# Patient Record
Sex: Male | Born: 1950
Health system: Southern US, Community
[De-identification: ages and names within clinical notes are randomized; demographics above are authoritative.]

## PROBLEM LIST (undated history)

## (undated) DIAGNOSIS — I10 Essential (primary) hypertension: Secondary | ICD-10-CM

## (undated) DIAGNOSIS — K5790 Diverticulosis of intestine, part unspecified, without perforation or abscess without bleeding: Secondary | ICD-10-CM

## (undated) DIAGNOSIS — E119 Type 2 diabetes mellitus without complications: Secondary | ICD-10-CM

## (undated) DIAGNOSIS — J45909 Unspecified asthma, uncomplicated: Secondary | ICD-10-CM

## (undated) DIAGNOSIS — H547 Unspecified visual loss: Secondary | ICD-10-CM

## (undated) DIAGNOSIS — M109 Gout, unspecified: Secondary | ICD-10-CM

## (undated) DIAGNOSIS — C61 Malignant neoplasm of prostate: Secondary | ICD-10-CM

## (undated) HISTORY — DX: Malignant neoplasm of prostate: C61

## (undated) HISTORY — DX: Gout, unspecified: M10.9

## (undated) HISTORY — DX: Diverticulosis of intestine, part unspecified, without perforation or abscess without bleeding: K57.90

## (undated) HISTORY — DX: Essential (primary) hypertension: I10

## (undated) HISTORY — DX: Unspecified asthma, uncomplicated: J45.909

## (undated) HISTORY — DX: Type 2 diabetes mellitus without complications: E11.9

## (undated) HISTORY — DX: Unspecified visual loss: H54.7

---

## 1999-04-24 ENCOUNTER — Encounter: Admission: RE | Admit: 1999-04-24 | Discharge: 1999-04-24 | Payer: Self-pay | Admitting: Internal Medicine

## 2001-12-02 ENCOUNTER — Encounter (INDEPENDENT_AMBULATORY_CARE_PROVIDER_SITE_OTHER): Payer: Self-pay | Admitting: Specialist

## 2001-12-02 ENCOUNTER — Ambulatory Visit (HOSPITAL_COMMUNITY): Admission: RE | Admit: 2001-12-02 | Discharge: 2001-12-02 | Payer: Self-pay | Admitting: *Deleted

## 2010-10-10 ENCOUNTER — Ambulatory Visit
Admission: RE | Admit: 2010-10-10 | Discharge: 2010-10-10 | Payer: Self-pay | Source: Home / Self Care | Attending: Urology | Admitting: Urology

## 2010-10-10 LAB — GLUCOSE, CAPILLARY: Glucose-Capillary: 107 mg/dL — ABNORMAL HIGH (ref 70–99)

## 2010-10-10 LAB — POCT I-STAT 4, (NA,K, GLUC, HGB,HCT): Glucose, Bld: 112 mg/dL — ABNORMAL HIGH (ref 70–99)

## 2010-10-15 NOTE — Op Note (Signed)
  NAME:  Austin Anderson, Austin Anderson                  ACCOUNT NO.:  [id number]  MEDICAL RECORD NO.:  [id number]          PATIENT TYPE:  AMB  LOCATION:  [facility]                         FACILITY:  [facility]  PHYSICIAN:  Maretta Bees. Vonita Moss, M.D.DATE OF BIRTH:  [birth year]  DATE OF PROCEDURE:  10/10/2010 DATE OF DISCHARGE:                              OPERATIVE REPORT   LOCATION:  [facility].  PREOPERATIVE DIAGNOSIS:  Elevated prostate-specific antigen and anal stenosis.  POSTOPERATIVE DIAGNOSIS:  Elevated prostate-specific antigen and anal stenosis.  PROCEDURE:  Transurethral ultrasound and biopsy of the prostate and dilation of anal stricture.  SURGEON:  Maretta Bees. Vonita Moss, M.D.  ANESTHESIA:  MAC.  INDICATIONS:  This gentleman was referred to me for evaluation of a PSA elevation of 4.36.  In the office, he was found to have anal stenosis, and I could not even palpate the prostate.  He is brought to the OR today for dilation of the anal stenosis and transurethral ultrasound biopsy of the prostate.  DESCRIPTION OF PROCEDURE:  The patient was brought to the operating room and placed in the left lateral decubitus position.  His anal stenosis was dilated using 2 digits.  I then inserted the transrectal ultrasound probe, and the seminal vesicles were unremarkable.  The prostate had 1 large right-sided calcifications.  There were no definite hypoechoic areas.  Urethral length was 5.43 cm and prostatic volume was 77 cc. Transrectal ultrasound-guided biopsies were taken from the mid base and apex bilaterally with medial and lateral biopsies in each area for a total of 12 cores in 12 bottles.  The ultrasound probe was removed.  The patient was taken to recovery room in good condition having tolerated procedure well.     Maretta Bees. Vonita Moss, M.D.     LJP/MEDQ  D:  10/10/2010  T:  10/10/2010  Job:  [id number]  cc:   Georgianne Fick, M.D. Fax: [phone]  Electronically Signed by Larey Dresser M.D. on 10/15/2010 03:52:46 PM

## 2010-11-27 ENCOUNTER — Encounter (HOSPITAL_COMMUNITY): Payer: Medicare Other

## 2010-11-27 ENCOUNTER — Other Ambulatory Visit: Payer: Self-pay | Admitting: Urology

## 2010-11-27 ENCOUNTER — Other Ambulatory Visit (HOSPITAL_COMMUNITY): Payer: Self-pay | Admitting: Urology

## 2010-11-27 ENCOUNTER — Ambulatory Visit (HOSPITAL_COMMUNITY)
Admission: RE | Admit: 2010-11-27 | Discharge: 2010-11-27 | Disposition: A | Discharge status: Home / Self Care | Payer: Medicare Other | Source: Ambulatory Visit | Attending: Urology | Admitting: Urology

## 2010-11-27 DIAGNOSIS — Z01818 Encounter for other preprocedural examination: Secondary | ICD-10-CM | POA: Insufficient documentation

## 2010-11-27 DIAGNOSIS — C61 Malignant neoplasm of prostate: Secondary | ICD-10-CM | POA: Insufficient documentation

## 2010-11-27 DIAGNOSIS — J45909 Unspecified asthma, uncomplicated: Secondary | ICD-10-CM | POA: Insufficient documentation

## 2010-11-27 DIAGNOSIS — K219 Gastro-esophageal reflux disease without esophagitis: Secondary | ICD-10-CM | POA: Insufficient documentation

## 2010-11-27 DIAGNOSIS — E119 Type 2 diabetes mellitus without complications: Secondary | ICD-10-CM | POA: Insufficient documentation

## 2010-11-27 DIAGNOSIS — G4730 Sleep apnea, unspecified: Secondary | ICD-10-CM | POA: Insufficient documentation

## 2010-11-27 DIAGNOSIS — I10 Essential (primary) hypertension: Secondary | ICD-10-CM | POA: Insufficient documentation

## 2010-11-27 DIAGNOSIS — Z01812 Encounter for preprocedural laboratory examination: Secondary | ICD-10-CM | POA: Insufficient documentation

## 2010-11-27 DIAGNOSIS — Z79899 Other long term (current) drug therapy: Secondary | ICD-10-CM | POA: Insufficient documentation

## 2010-11-27 LAB — BASIC METABOLIC PANEL
BUN: 11 mg/dL (ref 6–23)
CO2: 26 mEq/L (ref 19–32)
Chloride: 107 mEq/L (ref 96–112)
Creatinine, Ser: 1.24 mg/dL (ref 0.4–1.5)
Glucose, Bld: 118 mg/dL — ABNORMAL HIGH (ref 70–99)
Potassium: 4.3 mEq/L (ref 3.5–5.1)

## 2010-11-27 LAB — CBC
HCT: 39.1 % (ref 39.0–52.0)
Hemoglobin: 12.4 g/dL — ABNORMAL LOW (ref 13.0–17.0)
MCH: 28.7 pg (ref 26.0–34.0)
MCV: 90.5 fL (ref 78.0–100.0)
RBC: 4.32 MIL/uL (ref 4.22–5.81)

## 2010-12-04 ENCOUNTER — Inpatient Hospital Stay (HOSPITAL_COMMUNITY)
Admission: RE | Admit: 2010-12-04 | Discharge: 2010-12-05 | DRG: 708 | Disposition: A | Discharge status: Home / Self Care | Payer: Medicare Other | Source: Ambulatory Visit | Attending: Urology | Admitting: Urology

## 2010-12-04 ENCOUNTER — Other Ambulatory Visit: Payer: Self-pay | Admitting: Urology

## 2010-12-04 DIAGNOSIS — C61 Malignant neoplasm of prostate: Principal | ICD-10-CM | POA: Diagnosis present

## 2010-12-04 DIAGNOSIS — I10 Essential (primary) hypertension: Secondary | ICD-10-CM | POA: Diagnosis present

## 2010-12-04 DIAGNOSIS — K219 Gastro-esophageal reflux disease without esophagitis: Secondary | ICD-10-CM | POA: Diagnosis present

## 2010-12-04 DIAGNOSIS — E119 Type 2 diabetes mellitus without complications: Secondary | ICD-10-CM | POA: Diagnosis present

## 2010-12-04 DIAGNOSIS — N35919 Unspecified urethral stricture, male, unspecified site: Secondary | ICD-10-CM | POA: Diagnosis present

## 2010-12-04 LAB — GLUCOSE, CAPILLARY

## 2010-12-04 LAB — TYPE AND SCREEN: Antibody Screen: NEGATIVE

## 2010-12-04 LAB — ABO/RH: ABO/RH(D): O POS

## 2010-12-04 LAB — HEMOGLOBIN AND HEMATOCRIT, BLOOD: Hemoglobin: 11.9 g/dL — ABNORMAL LOW (ref 13.0–17.0)

## 2010-12-05 LAB — GLUCOSE, CAPILLARY
Glucose-Capillary: 107 mg/dL — ABNORMAL HIGH (ref 70–99)
Glucose-Capillary: 139 mg/dL — ABNORMAL HIGH (ref 70–99)
Glucose-Capillary: 147 mg/dL — ABNORMAL HIGH (ref 70–99)
Glucose-Capillary: 96 mg/dL (ref 70–99)

## 2010-12-10 NOTE — Op Note (Signed)
NAMEARWIN, BISCEGLIA                  ACCOUNT NO.:  [id number]  MEDICAL RECORD NO.:  [id number]           PATIENT TYPE:  I  LOCATION:  1420                         FACILITY:  [facility]  PHYSICIAN:  Heloise Purpura, MD      DATE OF BIRTH:  09-Dec-1950  DATE OF PROCEDURE:  12/04/2010 DATE OF DISCHARGE:                              OPERATIVE REPORT   PREOPERATIVE DIAGNOSIS:  Clinically localized adenocarcinoma of the prostate (clinical stage T1c NX MX).  POSTOPERATIVE DIAGNOSES: 1. Clinically localized adenocarcinoma of the prostate (clinical stage     T1c NX MX). 2. Urethral stricture.  PROCEDURES: 1. Robotic-assisted laparoscopic radical prostatectomy (bilateral     nerve sparing). 2. Cystoscopy. 3. Balloon dilation of urethral stricture.  SURGEON:  Heloise Purpura, MD  ASSISTANT:  Delia Chimes, NP-C  ANESTHESIA:  General.  COMPLICATIONS:  None.  ESTIMATED BLOOD LOSS:  50 cc.  INTRAVENOUS FLUIDS:  1500 mL of lactated Ringer's.  SPECIMEN:  Prostate and seminal vesicles.  Disposition of specimen to Pathology.  DRAINS: 1. A 20-French coude catheter. 2. A #19 Blake pelvic drain.  INDICATION:  Austin Anderson is a 60 year old gentleman with recently diagnosed clinically localized prostate cancer.  After thorough discussions regarding management options for treatment, he elected to proceed with surgical therapy and the above procedures.  The potential risks, complications, and alternative treatment options were discussed in detail and informed consent was obtained.  DESCRIPTION OF PROCEDURE:  The patient was taken to the operating room and a general anesthetic was administered.  He was given preoperative antibiotics, placed in the dorsal lithotomy position, and prepped and draped in the usual sterile fashion.  A preoperative time-out was then performed.  A 22-French catheter was then passed into the urethra, but was unable to be passed all the way into the bladder.  Upon removal  of this catheter, it was noted be blood at the meatus.  An attempt was then made to place a 20-French coude catheter which also met resistance in the proximal urethra, approximately at the same site.  At this point, it was decided to proceed with flexible cystoscopy.  Therefore, the 70- [place] flexible cystoscope was utilized to inspect the urethra.  In the bulbar urethra, there did appear to be a short bulbar urethral stricture and a false passage in this area.  This was able to be navigated with a flexible cystoscope into the bladder and a 0.038 sensor guidewire was advanced through the scope and into the bladder.  The cystoscope was then removed and an attempt was made to pass a 20-French council tip catheter over the wire, but this did resistance in the bulbar urethra. This was removed and an attempt was then made to pass an 18-French council tip catheter which also was unable to be passed all the way into the bladder due to the urethral stricture.  Therefore, the flexible cystoscope was passed next to the wire up to the level of the stricture. An Ultraxx balloon dilator was then passed through the flexible cystoscope and into the bladder and across the urethral stricture.  It was positioned under direct vision  and the cystoscope was removed.  The balloon was then dilated up to a pressure of 17 atmospheres of pressure. This was left inflated for approximately 3 minutes and then deflated and removed.  The 18-French council tip catheter was then able to be passed over the wire into the bladder without difficulty.  The patient's abdomen was then examined and a site was selected just to the left of the umbilicus for placement of the camera port.  This was placed using a standard open Hasson technique.  This allowed entry into the peritoneal cavity under direct vision and without difficulty.  A 12-mm port was placed and pneumoperitoneum established.  With the 0-degree lens, the abdomen was  inspected and there was no evidence of any intra-abdominal injuries or other abnormalities.  The remaining ports were then placed. A 12-mm port was placed in the far right lateral abdominal wall and 8-mm robotic ports were placed in the right lower quadrant, left lower quadrant, and far left lower quadrant.  A 5-mm port was placed in the right upper quadrant.  All ports were placed under direct vision and without difficulty.  The surgical cart was then docked.  With the aid of the cautery scissors, the bladder was reflected posteriorly allowing entry into space of Retzius and identification of the endopelvic fascia and prostate.  The endopelvic fascia was then incised from the apex back to the base of the prostate bilaterally and the underlying levator muscle fibers were swept laterally off the prostate thereby isolating the dorsal venous complex.  The dorsal vein was then stapled and divided with a 45 mm Flex Echelon stapler.  The bladder neck was identified and was able to be divided anteriorly thereby exposing the Foley catheter. The catheter along with the sensor guidewire was then brought anteriorly and used to retract the prostate anteriorly.  The posterior bladder neck was examined and there was no evidence for a large intravesical median lobe.  The bladder neck was divided and dissection proceeded between the bladder and prostate until the vasa deferentia and seminal vesicles were identified.  The vasa deferentia were isolated, divided, and lifted anteriorly and the seminal vesicles were dissected down to their tips with care to control the seminal vesicle arterial blood supply and also lifted anteriorly.  The space between Denonvilliers fascia and the anterior rectum was then developed with combination of sharp and blunt dissection.  The lateral prostatic fascia was incised bilaterally allowing the neurovascular bundles to be released and the vascular pedicles of the prostate then  ligated with Hem-o-lok clips above the level of the neurovascular bundles.  The neurovascular bundles were then swept off the apex of the prostate and urethra and urethra was sharply transected.  The prostate was thereby disarticulated and the pelvis was copiously irrigated and hemostasis was ensured.  There was no evidence for rectal injury.  Attention then turned to the urethral anastomosis. The council tip catheter was slightly retracted into the bulbar urethra, although the guidewire was left in place to the urethra and into the operative field.  A 2-0 Vicryl slip knot was placed between Denonvilliers fascia, the posterior bladder neck, and the posterior urethra to reapproximate these structures.  A double-armed 3-0 Monocryl suture was then used to perform a 360-degree running tension-free anastomosis between the bladder neck and urethra.  Once the majority of the anastomosis was completed, the catheter was advanced over the guidewire and the guidewire was removed.  The catheter was directly placed into the bladder under direct  vision and the balloon subsequently inflated.  The anastomosis was carefully performed to completion with a catheter in place, taking care not to injure the catheter.  This allowed completion of the anastomosis which was performed in a tension-free and watertight fashion.  This was confirmed after the catheter was irrigated with no evidence of any urine leak.  A #19 Blake drain was then brought through the left robotic port and positioned appropriately in the pelvis.  It was secured to the skin with a nylon suture.  The surgical cart was undocked.  The right lateral 12-mm port site was closed with a 0 Vicryl suture placed laparoscopically.  All remaining ports removed under direct vision and the prostate specimen was removed intact within the Endopouch retrieval bag via the periumbilical port site.  This fascial opening was then closed with 2 running 0 Vicryl  sutures.  All port sites were injected with 0.25% Marcaine and reapproximated at the skin level with staples.  Sterile dressings were applied.  The patient appeared to tolerate the procedure well and without complications.  He was able to be extubated and transferred to recovery unit in satisfactory condition.     Heloise Purpura, MD     LB/MEDQ  D:  12/04/2010  T:  12/04/2010  Job:  [id number]  Electronically Signed by Heloise Purpura MD on 12/10/2010 10:22:58 PM

## 2011-01-30 NOTE — Procedures (Signed)
[facility]  Patient:    Austin Anderson, Austin Anderson Visit Number: [id number] MRN: [id number]          Service Type: END Location: ENDO Attending Physician:  Sabino Gasser Dictated by:   Sabino Gasser, M.D. Proc. Date: 12/02/01 Admit Date:  12/02/2001                             Procedure Report  PROCEDURE:  Upper endoscopy.  INDICATION:  Hemoccult positivity.  ANESTHESIA:  Demerol 75, Versed 6 mg.  DESCRIPTION OF PROCEDURE:  With patient mildly sedated in the left lateral decubitus position, the Olympus videoscopic endoscope was inserted into the mouth and passed under direct vision through the esophagus which appeared normal.  We entered into the stomach, and there was blood seen in the stomach. This was photographed.  We advanced to the antrum, duodenal bulb, and second portion of duodenum, appeared normal.  From this point, the endoscope was slowly withdrawn, taking circumferential views of the entire duodenal mucosa until the endoscope then pulled back into the stomach and placed in retroflexion to view the stomach from below.  The endoscope was then straightened and withdrawn, taking circumferential views of the remaining gastric and esophageal mucosa, stopping to biopsy and photograph the areas in the body and fundus where the blood was seen.  The patients vital signs and pulse oximeter remained stable.  The patient tolerated the procedure well without apparent complications.  FINDINGS:  Blood in the stomach, etiology unclear.  Otherwise unremarkable examination.  PLAN:  Await biopsy report.  The patient will call me for results and follow up with me as an outpatient.  Proceed to colonoscopy as planned. Dictated by:   Sabino Gasser, M.D. Attending Physician:  Sabino Gasser DD:  12/02/01 TD:  12/04/01 Job: [id number]

## 2011-01-30 NOTE — Procedures (Signed)
[facility]  Patient:    Austin Anderson, Austin Anderson Visit Number: [id number] MRN: [id number]          Service Type: END Location: ENDO Attending Physician:  Sabino Gasser Dictated by:   Sabino Gasser, M.D. Proc. Date: 12/02/01 Admit Date:  12/02/2001                             Procedure Report  PROCEDURE:  Colonoscopy.  INDICATIONS:  Hemoccult positivity.  ANESTHESIA:  Demerol 20 mg, Versed 2 mg additional.  DESCRIPTION OF PROCEDURE:  With the patient mildly sedated in the left lateral decubitus position, subsequently with pressure applied, the Olympus videoscopic colonoscope was inserted in the rectum after rectal exam was performed and passed under direct vision to the cecum, identified by the ileocecal valve and appendiceal orifice. The prep was somewhat suboptimal in that there was tenacious material on the colonic wall that could not be adequately washed but no gross lesions were seen in the cecum. From this point, the colonoscope was slowly withdrawn taking circumferential views of the entire colonic mucosa stopping to photograph diverticulosis seen in the sigmoid colon along the wall until we reached the rectum, which appeared normal in direct view and showed hemorrhoids on retroflex and pullthrough view of the anal canal. The endoscope was withdrawn. The patients vital signs and pulse oximeter remained stable. The patient tolerated the procedure well without apparent complications.  FINDINGS:  Diverticulosis of the sigmoid colon and internal and external hemorrhoids.  PLAN:  See endoscopy note for further details. Dictated by:   Sabino Gasser, M.D. Attending Physician:  Sabino Gasser DD:  12/02/01 TD:  12/04/01 Job: [id number]

## 2011-11-05 DIAGNOSIS — E119 Type 2 diabetes mellitus without complications: Secondary | ICD-10-CM | POA: Diagnosis not present

## 2011-11-05 DIAGNOSIS — I10 Essential (primary) hypertension: Secondary | ICD-10-CM | POA: Diagnosis not present

## 2011-11-05 DIAGNOSIS — M109 Gout, unspecified: Secondary | ICD-10-CM | POA: Diagnosis not present

## 2011-11-19 DIAGNOSIS — K644 Residual hemorrhoidal skin tags: Secondary | ICD-10-CM | POA: Diagnosis not present

## 2011-11-19 DIAGNOSIS — E119 Type 2 diabetes mellitus without complications: Secondary | ICD-10-CM | POA: Diagnosis not present

## 2011-11-19 DIAGNOSIS — I10 Essential (primary) hypertension: Secondary | ICD-10-CM | POA: Diagnosis not present

## 2011-11-19 DIAGNOSIS — M109 Gout, unspecified: Secondary | ICD-10-CM | POA: Diagnosis not present

## 2011-12-29 DIAGNOSIS — C61 Malignant neoplasm of prostate: Secondary | ICD-10-CM | POA: Diagnosis not present

## 2012-03-29 DIAGNOSIS — E119 Type 2 diabetes mellitus without complications: Secondary | ICD-10-CM | POA: Diagnosis not present

## 2012-03-29 DIAGNOSIS — K644 Residual hemorrhoidal skin tags: Secondary | ICD-10-CM | POA: Diagnosis not present

## 2012-03-29 DIAGNOSIS — M109 Gout, unspecified: Secondary | ICD-10-CM | POA: Diagnosis not present

## 2012-03-29 DIAGNOSIS — I10 Essential (primary) hypertension: Secondary | ICD-10-CM | POA: Diagnosis not present

## 2012-04-05 DIAGNOSIS — J45909 Unspecified asthma, uncomplicated: Secondary | ICD-10-CM | POA: Diagnosis not present

## 2012-04-05 DIAGNOSIS — E119 Type 2 diabetes mellitus without complications: Secondary | ICD-10-CM | POA: Diagnosis not present

## 2012-04-05 DIAGNOSIS — I10 Essential (primary) hypertension: Secondary | ICD-10-CM | POA: Diagnosis not present

## 2012-04-05 DIAGNOSIS — M109 Gout, unspecified: Secondary | ICD-10-CM | POA: Diagnosis not present

## 2012-07-12 DIAGNOSIS — N529 Male erectile dysfunction, unspecified: Secondary | ICD-10-CM | POA: Diagnosis not present

## 2012-07-12 DIAGNOSIS — C61 Malignant neoplasm of prostate: Secondary | ICD-10-CM | POA: Diagnosis not present

## 2012-08-23 DIAGNOSIS — M109 Gout, unspecified: Secondary | ICD-10-CM | POA: Diagnosis not present

## 2012-08-23 DIAGNOSIS — J45909 Unspecified asthma, uncomplicated: Secondary | ICD-10-CM | POA: Diagnosis not present

## 2012-08-23 DIAGNOSIS — I10 Essential (primary) hypertension: Secondary | ICD-10-CM | POA: Diagnosis not present

## 2012-08-23 DIAGNOSIS — E119 Type 2 diabetes mellitus without complications: Secondary | ICD-10-CM | POA: Diagnosis not present

## 2012-11-29 ENCOUNTER — Encounter: Payer: Self-pay | Admitting: Internal Medicine

## 2012-11-29 DIAGNOSIS — C61 Malignant neoplasm of prostate: Secondary | ICD-10-CM | POA: Insufficient documentation

## 2012-11-30 ENCOUNTER — Encounter: Payer: Self-pay | Admitting: Internal Medicine

## 2012-11-30 ENCOUNTER — Ambulatory Visit (INDEPENDENT_AMBULATORY_CARE_PROVIDER_SITE_OTHER): Payer: Medicare Other | Admitting: Internal Medicine

## 2012-11-30 VITALS — BP 136/90 | HR 81 | Temp 98.1°F | Ht 65.0 in | Wt 230.4 lb

## 2012-11-30 DIAGNOSIS — M109 Gout, unspecified: Secondary | ICD-10-CM | POA: Diagnosis not present

## 2012-11-30 DIAGNOSIS — C61 Malignant neoplasm of prostate: Secondary | ICD-10-CM | POA: Diagnosis not present

## 2012-11-30 DIAGNOSIS — Z23 Encounter for immunization: Secondary | ICD-10-CM

## 2012-11-30 DIAGNOSIS — E119 Type 2 diabetes mellitus without complications: Secondary | ICD-10-CM | POA: Insufficient documentation

## 2012-11-30 DIAGNOSIS — J45909 Unspecified asthma, uncomplicated: Secondary | ICD-10-CM | POA: Diagnosis not present

## 2012-11-30 DIAGNOSIS — I10 Essential (primary) hypertension: Secondary | ICD-10-CM | POA: Diagnosis not present

## 2012-11-30 DIAGNOSIS — H543 Unqualified visual loss, both eyes: Secondary | ICD-10-CM | POA: Diagnosis not present

## 2012-11-30 DIAGNOSIS — H547 Unspecified visual loss: Secondary | ICD-10-CM | POA: Insufficient documentation

## 2012-11-30 LAB — LIPID PANEL
Cholesterol: 128 mg/dL (ref 0–200)
HDL: 46 mg/dL (ref >39)
LDL Cholesterol: 74 mg/dL (ref 0–99)
Triglycerides: 40 mg/dL (ref <150)

## 2012-11-30 LAB — CBC WITH DIFFERENTIAL/PLATELET
Basophils Absolute: 0 10*3/uL (ref 0.0–0.1)
Basophils Relative: 1 % (ref 0–1)
Eosinophils Absolute: 0.3 10*3/uL (ref 0.0–0.7)
Hemoglobin: 13.9 g/dL (ref 13.0–17.0)
MCH: 28.6 pg (ref 26.0–34.0)
MCHC: 33.3 g/dL (ref 30.0–36.0)
Monocytes Relative: 6 % (ref 3–12)
Neutro Abs: 3.7 10*3/uL (ref 1.7–7.7)
Neutrophils Relative %: 56 % (ref 43–77)
Platelets: 217 10*3/uL (ref 150–400)

## 2012-11-30 MED ORDER — LOSARTAN POTASSIUM 100 MG PO TABS: 100.0000 mg | ORAL_TABLET | Freq: Every day | ORAL | Status: DC

## 2012-11-30 MED ORDER — FLUTICASONE PROPIONATE HFA 110 MCG/ACT IN AERO: 1.0000 | INHALATION_SPRAY | Freq: Two times a day (BID) | RESPIRATORY_TRACT | Status: DC

## 2012-11-30 MED ORDER — ATENOLOL 25 MG PO TABS: 25.0000 mg | ORAL_TABLET | Freq: Every day | ORAL | Status: DC

## 2012-11-30 MED ORDER — ALLOPURINOL 300 MG PO TABS: 300.0000 mg | ORAL_TABLET | Freq: Two times a day (BID) | ORAL | Status: DC

## 2012-11-30 MED ORDER — ALBUTEROL SULFATE HFA 108 (90 BASE) MCG/ACT IN AERS: 2.0000 | INHALATION_SPRAY | Freq: Four times a day (QID) | RESPIRATORY_TRACT | Status: DC | PRN

## 2012-11-30 NOTE — Progress Notes (Signed)
HPI The patient is a 62 y.o. male with a history of asthma, DM, HTN, gout, presenting for an initial visit to establish care.  The patient was previously seen by [facility], but was discharged when he lost his insurance.  The patient has a history of gout, which he describes as well-controlled.  In the past he had frequent flares, but now only has 2-3 flares per day.  The patient is currently taking allopurinol, 300 mg BID.  During acute flares, he takes Indomethacin, 50 mg BID until the flare resolves (typically 3-4 days).  The patient has a history of diabetes, diagnosed in 2009.  His last A1C was 7.1, one year ago.  The patient does not check his blood sugar at home, and does not have a glucometer.  The patient has never been on treatment for diabetes.  The patient notes that he tries to make healthy changes to his diet, choosing sugar free substitutes when able, and eating less bread.  The patient has a treadmill at home, which he uses every day, for about 30 minutes.  He notes no polyuria/polydipsia, tremulousness, diaphoresis, or AMS.  The patient has a history of high blood pressure for 15 years.  The patient has a history of prostate cancer, s/p radical prostatectomy in 12/04/10 with Dr. Laverle Patter.  The patient still goes to see Dr. Laverle Patter every 6 months, with no signs of recurrence.  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    The patient has a history of asthma.  The patient has never been intubated, but has taken steroids in the past.  He currently uses albuterol once/week.    The patient has not had a flu shot this year.  The patient had a pneumovax at [facility].  ROS: General: no fevers, chills, changes in weight, changes in appetite Skin: no rash HEENT: no hearing changes, sore throat Pulm: no dyspnea, coughing, wheezing CV: no chest pain, palpitations, shortness of breath Abd: no  abdominal pain, nausea/vomiting, diarrhea/constipation GU: no dysuria, hematuria, polyuria Ext: no arthralgias, myalgias Neuro: no weakness, numbness, or tingling  Filed Vitals:   11/30/12 1331  BP: 136/90  Pulse: 81  Temp: 98.1 F (36.7 C)    PEX General: alert, cooperative, and in no apparent distress HEENT: eyes clouded, unfocused.  Oropharynx non-erythematous  Neck: supple, no lymphadenopathy, no JVD Lungs: clear to ascultation bilaterally, normal work of respiration, no wheezes, rales, ronchi Heart: regular rate and rhythm, no murmurs, gallops, or rubs Abdomen: soft, non-tender, non-distended, normal bowel sounds Extremities: no cyanosis, clubbing, or edema Neurologic: alert & oriented X3, cranial nerves II-XII intact, strength grossly intact, sensation intact to light touch  No current outpatient prescriptions on file prior to visit.   No current facility-administered medications on file prior to visit.    Assessment/Plan

## 2012-11-30 NOTE — Assessment & Plan Note (Signed)
Continue flovent and albuterol

## 2012-11-30 NOTE — Assessment & Plan Note (Signed)
Lab Results  Component Value Date   HGBA1C 6.4 11/30/2012     Assessment:  Diabetes control: good control (HgbA1C at goal)  Progress toward A1C goal:  at goal  Comments: The patient has never been on medications for DM.  A1C's in the past have been as high as 7.1.  We will continue to manage the patient with only diet/exercise.  Plan:  Medications:  No medications, continue diet/exercise changes  Home glucose monitoring:   Frequency:     Timing:    Instruction/counseling given: reminded to bring medications to each visit  Educational resources provided:    Self management tools provided:    Other plans: We will try to get him a talking glucometer

## 2012-11-30 NOTE — Assessment & Plan Note (Signed)
Followed by Dr. Laverle Patter every 6 months.  Greatly appreciate assistance.

## 2012-11-30 NOTE — Assessment & Plan Note (Signed)
Will check uric acid level today -continue allopurinol 300 mg BID, titrate to uric acid < 6

## 2012-11-30 NOTE — Assessment & Plan Note (Signed)
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  He lives by himself, and has an aide help him with his medications.

## 2012-11-30 NOTE — Assessment & Plan Note (Signed)
BP Readings from Last 3 Encounters:  11/30/12 136/90    Lab Results  Component Value Date   NA 140 11/27/2010   K 4.3 11/27/2010   CREATININE 1.24 11/27/2010    Assessment:  Blood pressure control: controlled  Progress toward BP goal:  at goal  Comments: BP high-normal today, likely due to being out of his medications here at his transition of care.  Plan:  Medications:  continue current medications  Educational resources provided:    Self management tools provided:    Other plans: check CMP today

## 2012-11-30 NOTE — Patient Instructions (Addendum)
General Instructions: We will continue to follow your diabetes.  Your Hemoglobin A1C was 6.4 today, which is great!  We want to keep this number under 7.  In the future, if your hemoglobin A1C rises above 7, we may need to start a medication called Metformin to lower your blood sugar.  Continue to take your current medications (a list has been provided).  We are checking several labs today.  We will call you if any of them are abnormal.  Please return for a follow-up visit in 3 months.   Treatment Goals:  Goals (1 Years of Data) as of 11/30/12   None      Progress Toward Treatment Goals:  Treatment Goal 11/30/2012  Hemoglobin A1C at goal  Blood pressure at goal    Self Care Goals & Plans:       Care Management & Community Referrals:

## 2012-12-01 LAB — COMPLETE METABOLIC PANEL WITH GFR
ALT: 14 U/L (ref 0–53)
AST: 20 U/L (ref 0–37)
Albumin: 4.3 g/dL (ref 3.5–5.2)
Alkaline Phosphatase: 53 U/L (ref 39–117)
GFR, Est Non African American: 58 mL/min — ABNORMAL LOW
Potassium: 4.9 mEq/L (ref 3.5–5.3)
Sodium: 141 mEq/L (ref 135–145)
Total Bilirubin: 0.6 mg/dL (ref 0.3–1.2)
Total Protein: 7.1 g/dL (ref 6.0–8.3)

## 2012-12-01 LAB — MICROALBUMIN / CREATININE URINE RATIO
Creatinine, Urine: 257.4 mg/dL
Microalb Creat Ratio: 10.2 mg/g (ref 0.0–30.0)
Microalb, Ur: 2.63 mg/dL — ABNORMAL HIGH (ref 0.00–1.89)

## 2012-12-21 ENCOUNTER — Other Ambulatory Visit: Payer: Self-pay | Admitting: Dietician

## 2012-12-21 DIAGNOSIS — E119 Type 2 diabetes mellitus without complications: Secondary | ICD-10-CM

## 2013-01-05 ENCOUNTER — Telehealth: Payer: Self-pay | Admitting: Dietician

## 2013-01-05 NOTE — Telephone Encounter (Signed)
Patient agreed to see CDE on same day as doctor in June.

## 2013-01-13 DIAGNOSIS — C61 Malignant neoplasm of prostate: Secondary | ICD-10-CM | POA: Diagnosis not present

## 2013-01-13 DIAGNOSIS — N529 Male erectile dysfunction, unspecified: Secondary | ICD-10-CM | POA: Diagnosis not present

## 2013-02-15 ENCOUNTER — Encounter: Payer: Self-pay | Admitting: Internal Medicine

## 2013-02-15 ENCOUNTER — Ambulatory Visit (HOSPITAL_BASED_OUTPATIENT_CLINIC_OR_DEPARTMENT_OTHER): Payer: Medicare Other | Admitting: Dietician

## 2013-02-15 ENCOUNTER — Ambulatory Visit (HOSPITAL_BASED_OUTPATIENT_CLINIC_OR_DEPARTMENT_OTHER): Payer: Medicare Other | Admitting: Internal Medicine

## 2013-02-15 VITALS — BP 122/80 | HR 66 | Temp 97.8°F | Ht 65.0 in | Wt 231.1 lb

## 2013-02-15 DIAGNOSIS — R109 Unspecified abdominal pain: Secondary | ICD-10-CM | POA: Insufficient documentation

## 2013-02-15 DIAGNOSIS — N182 Chronic kidney disease, stage 2 (mild): Secondary | ICD-10-CM | POA: Insufficient documentation

## 2013-02-15 DIAGNOSIS — E119 Type 2 diabetes mellitus without complications: Secondary | ICD-10-CM | POA: Diagnosis not present

## 2013-02-15 DIAGNOSIS — I10 Essential (primary) hypertension: Secondary | ICD-10-CM | POA: Diagnosis not present

## 2013-02-15 DIAGNOSIS — N183 Chronic kidney disease, stage 3 unspecified: Secondary | ICD-10-CM | POA: Insufficient documentation

## 2013-02-15 LAB — POCT GLYCOSYLATED HEMOGLOBIN (HGB A1C): Hemoglobin A1C: 6.2

## 2013-02-15 LAB — GLUCOSE, CAPILLARY: Glucose-Capillary: 110 mg/dL — ABNORMAL HIGH (ref 70–99)

## 2013-02-15 MED ORDER — ATORVASTATIN CALCIUM 40 MG PO TABS: 40.0000 mg | ORAL_TABLET | Freq: Every day | ORAL | Status: DC

## 2013-02-15 NOTE — Patient Instructions (Signed)
General Instructions: We found at your last visit that your cholesterol is mildly elevated.  We will start you on a medication called Atorvastatin, 1 tablet once per day.  Your diabetes is very well controlled.  Please return for a follow-up visit in 6 months, or sooner if needed.   Treatment Goals:  Goals (1 Years of Data) as of 02/15/13   None      Progress Toward Treatment Goals:  Treatment Goal 02/15/2013  Hemoglobin A1C at goal  Blood pressure at goal    Self Care Goals & Plans:  Self Care Goal 02/15/2013  Manage my medications refill my medications on time; take my medicines as prescribed  Eat healthy foods eat foods that are low in salt  Be physically active (No Data)    Home Blood Glucose Monitoring 02/15/2013  Check my blood sugar no home glucose monitoring  When to check my blood sugar N/A     Care Management & Community Referrals:  Referral 02/15/2013  Referrals made for care management support none needed

## 2013-02-15 NOTE — Patient Instructions (Addendum)
Great job eating fruits, vegetables, dairy and nuts!  To help you decrease your portions remember the divided plate-   half a plate of non starchy vegetables(peas, tomatoes, lettuce, cabbage, carrots) ,   1/4 of your plate starchy food( muffin, corn, baked beans)   1/4 of of your plate should be a protein- low or medium fat is best.   See recipe for oven fried chicken and pork chop.   Please make a follow up appointment for 3-4 weeks as desired.

## 2013-02-15 NOTE — Assessment & Plan Note (Signed)
The patient notes a sub-umbilical "pinching" sensation once/week, typically prior to a BM.  The pain doesn't particularly bother the patient.  Abdominal exam was benign, and recent LFT's were unremarkable.  I believe this is likely secondary to constipation -patient advised to use a daily stool softener or fiber supplement, such as benefiber or miralax

## 2013-02-15 NOTE — Progress Notes (Signed)
Medical Nutrition Therapy:  Appt start time: 1400 end time:  1500. Initial Visit Assessment:  Primary concerns today: Weight management/portion control  Usual eating pattern includes 3 meals and 1-3 snacks per day. Usual physical activity includes occasional walks on treadmill. Labs noted, blood pressure is at target, Patient weighs 81# more than the upper level of ideal weight range.  Frequent foods include nuts, chicken, pork chops, and fruit.  No avoided foods. Monitoring bread intake.    Review of patient's typical day reveals patient's diet consists of large portions and is high in fat and sweet drinks.  Progress Towards Goal(s):  In progress.   Nutritional Diagnosis:  NB-1.1 Food and nutrition-related knowledge deficit As related to appropriate portion sizes.  As evidenced by BMI of 38, class 3 obesity.    Intervention:  Nutrition education on the plate method provided to patient.  Monitoring/Evaluation:  Dietary intake, exercise, and body weight prn. Recommended patient follow up in 3- 4 weeks. Patient not ready to schedule follow up appointment at this time.

## 2013-02-15 NOTE — Assessment & Plan Note (Addendum)
Lab Results  Component Value Date   HGBA1C 6.2 02/15/2013   HGBA1C 6.4 11/30/2012     Assessment: Diabetes control: good control (HgbA1C at goal) Progress toward A1C goal:  at goal Comments: Diet-controlled DM, well-controlled.  Patient would benefit from high-intensity statin  Plan: Medications:  Continue diet, exercise.  Start Atorvastatin 40.  Called pharmacy, $1.30 cost of atorvastatin. Home glucose monitoring: Frequency: no home glucose monitoring Timing: N/A Instruction/counseling given: discussed foot care Educational resources provided:   Self management tools provided:   Other plans: Foot exam performed today

## 2013-02-15 NOTE — Assessment & Plan Note (Signed)
BP Readings from Last 3 Encounters:  02/15/13 122/80  11/30/12 136/90    Lab Results  Component Value Date   NA 141 11/30/2012   K 4.9 11/30/2012   CREATININE 1.34 11/30/2012    Assessment: Blood pressure control: controlled Progress toward BP goal:  at goal Comments: Well-controlled  Plan: Medications:  continue current medications Educational resources provided:   Self management tools provided:   Other plans: Recheck at next visit

## 2013-02-15 NOTE — Progress Notes (Signed)
HPI The patient is a 62 y.o. male with a history of DM, HTN, asthma, blindness, prostate cancer s/p prostatectomy, presenting for a follow-up visit.  The patient has a history of diet-controlled DM.  Last A1C was 6.4.  Today, A1C is 6.2.  The patient does not check his blood sugars at home.  The patient notes rare umbilical pain, described as a "pinching" sensation, 3-4/10, lasting 1-2 minutes.  Often felt before a bowel movement.  This occurs once/week.  He has a history of hemorrhoids, and continues to note some constipation.  The patient has a stool softener that he uses.  ROS: General: no fevers, chills, changes in weight, changes in appetite Skin: no rash HEENT: no blurry vision, hearing changes, sore throat Pulm: no dyspnea, coughing, wheezing CV: no chest pain, palpitations, shortness of breath Abd: no abdominal pain, nausea/vomiting, diarrhea/constipation GU: no dysuria, hematuria, polyuria Ext: no arthralgias, myalgias Neuro: no weakness, numbness, or tingling  Filed Vitals:   02/15/13 1319  BP: 122/80  Pulse: 66  Temp: 97.8 F (36.6 C)    PEX General: alert, cooperative, and in no apparent distress HEENT: pupils equal round and reactive to light, vision grossly intact, oropharynx clear and non-erythematous  Neck: supple, no lymphadenopathy Lungs: clear to ascultation bilaterally, normal work of respiration, no wheezes, rales, ronchi Heart: regular rate and rhythm, no murmurs, gallops, or rubs Abdomen: soft, non-tender, non-distended, normal bowel sounds Extremities: 2+ DP/PT pulses bilaterally, no cyanosis, clubbing, or edema Neurologic: alert & oriented X3, cranial nerves II-XII intact, strength grossly intact, sensation intact to light touch  Current Outpatient Prescriptions on File Prior to Visit  Medication Sig Dispense Refill  . albuterol (PROVENTIL HFA;VENTOLIN HFA) 108 (90 BASE) MCG/ACT inhaler Inhale 2 puffs into the lungs every 6 (six) hours as needed for  wheezing.  1 Inhaler  11  . allopurinol (ZYLOPRIM) 300 MG tablet Take 1 tablet (300 mg total) by mouth 2 (two) times daily.  180 tablet  3  . atenolol (TENORMIN) 25 MG tablet Take 1 tablet (25 mg total) by mouth daily.  90 tablet  3  . fluticasone (FLOVENT HFA) 110 MCG/ACT inhaler Inhale 1 puff into the lungs 2 (two) times daily.  1 Inhaler  11  . losartan (COZAAR) 100 MG tablet Take 1 tablet (100 mg total) by mouth daily.  90 tablet  3   No current facility-administered medications on file prior to visit.    Assessment/Plan

## 2013-02-15 NOTE — Progress Notes (Signed)
INTERNAL MEDICINE TEACHING ATTENDING ADDENDUM: I discussed this case with Dr. Brown soon after the patient visit. I have read the documentation and I agree with the plan of care. Please see the resident note above for details of management.  

## 2013-02-20 ENCOUNTER — Encounter: Payer: Self-pay | Admitting: Dietician

## 2013-03-23 ENCOUNTER — Other Ambulatory Visit: Payer: Self-pay

## 2013-04-28 ENCOUNTER — Telehealth: Payer: Self-pay | Admitting: *Deleted

## 2013-04-28 DIAGNOSIS — I10 Essential (primary) hypertension: Secondary | ICD-10-CM

## 2013-04-28 NOTE — Telephone Encounter (Signed)
I talked with Dr Rogelia Boga and she asked for pt to come into clinic Monday for lab work. I called Pharmacist and she will inform pt when she reaches him.  She did deactivate the Rx so he will need a new Rx after lab results.

## 2013-04-28 NOTE — Telephone Encounter (Signed)
Received call from Pharmacist with [facility]. - # [phone] She reports that the Rx for Cozaar 100 mg take one tab by month daily was filled in error by [facility], filled with directions  to take cozaar 100 mg  twice a day.   This was  written on 3/19 and refilled with same directions on 5/8. Error noted today   Pt last visit in clinic 02/15/13 with BP of 122/80 I tried to call pt to verify what he was taking, no answer and phone unable to accept voice mail. Pharmacy will be calling pt and making him aware of error.

## 2013-05-01 NOTE — Telephone Encounter (Signed)
I agree, thank you for this note.

## 2013-05-03 NOTE — Telephone Encounter (Signed)
Dr Manson Passey, Please enter the labs to be drawn for this pt.  He will come in 8/27 to have them drawn.

## 2013-05-03 NOTE — Telephone Encounter (Signed)
BMET ordered.  Thank you.

## 2013-05-03 NOTE — Telephone Encounter (Signed)
Talked with pt and he will not be able to come in for lab work until [employer] of next week.  He is back to taking Cozaar once a day.

## 2013-05-10 ENCOUNTER — Other Ambulatory Visit (INDEPENDENT_AMBULATORY_CARE_PROVIDER_SITE_OTHER): Payer: Medicare Other

## 2013-05-10 DIAGNOSIS — I10 Essential (primary) hypertension: Secondary | ICD-10-CM | POA: Diagnosis not present

## 2013-05-11 LAB — BASIC METABOLIC PANEL
BUN: 16 mg/dL (ref 6–23)
Chloride: 108 mEq/L (ref 96–112)
Creat: 1.23 mg/dL (ref 0.50–1.35)
Glucose, Bld: 111 mg/dL — ABNORMAL HIGH (ref 70–99)
Potassium: 4.4 mEq/L (ref 3.5–5.3)

## 2013-07-21 ENCOUNTER — Other Ambulatory Visit: Payer: Self-pay | Admitting: *Deleted

## 2013-07-21 ENCOUNTER — Other Ambulatory Visit: Payer: Self-pay | Admitting: Internal Medicine

## 2013-07-21 MED ORDER — LOSARTAN POTASSIUM 100 MG PO TABS: ORAL_TABLET | ORAL | Status: DC

## 2013-07-21 NOTE — Telephone Encounter (Signed)
Call from [facility] - ned clarification of direction; whether once a day or twice a day. States they sent the wrong surascript for twice a day.  Thanks

## 2013-07-21 NOTE — Telephone Encounter (Signed)
That should be once per day.  Thank you for catching this

## 2013-08-02 DIAGNOSIS — C61 Malignant neoplasm of prostate: Secondary | ICD-10-CM | POA: Diagnosis not present

## 2013-08-02 DIAGNOSIS — N529 Male erectile dysfunction, unspecified: Secondary | ICD-10-CM | POA: Diagnosis not present

## 2013-08-16 ENCOUNTER — Ambulatory Visit (INDEPENDENT_AMBULATORY_CARE_PROVIDER_SITE_OTHER): Payer: Medicare Other | Admitting: Internal Medicine

## 2013-08-16 ENCOUNTER — Ambulatory Visit (HOSPITAL_COMMUNITY)
Admission: RE | Admit: 2013-08-16 | Discharge: 2013-08-16 | Disposition: A | Discharge status: Home / Self Care | Payer: Medicare Other | Source: Ambulatory Visit | Attending: Internal Medicine | Admitting: Internal Medicine

## 2013-08-16 ENCOUNTER — Encounter: Payer: Self-pay | Admitting: Internal Medicine

## 2013-08-16 VITALS — BP 125/80 | HR 73 | Temp 97.4°F | Ht 65.0 in | Wt 227.2 lb

## 2013-08-16 DIAGNOSIS — J3489 Other specified disorders of nose and nasal sinuses: Secondary | ICD-10-CM

## 2013-08-16 DIAGNOSIS — Z0000 Encounter for general adult medical examination without abnormal findings: Secondary | ICD-10-CM

## 2013-08-16 DIAGNOSIS — Z23 Encounter for immunization: Secondary | ICD-10-CM | POA: Diagnosis not present

## 2013-08-16 DIAGNOSIS — R079 Chest pain, unspecified: Secondary | ICD-10-CM | POA: Diagnosis not present

## 2013-08-16 DIAGNOSIS — E119 Type 2 diabetes mellitus without complications: Secondary | ICD-10-CM | POA: Diagnosis not present

## 2013-08-16 DIAGNOSIS — R0789 Other chest pain: Secondary | ICD-10-CM | POA: Insufficient documentation

## 2013-08-16 DIAGNOSIS — R0981 Nasal congestion: Secondary | ICD-10-CM | POA: Insufficient documentation

## 2013-08-16 DIAGNOSIS — H543 Unqualified visual loss, both eyes: Secondary | ICD-10-CM | POA: Diagnosis not present

## 2013-08-16 DIAGNOSIS — M109 Gout, unspecified: Secondary | ICD-10-CM

## 2013-08-16 DIAGNOSIS — I10 Essential (primary) hypertension: Secondary | ICD-10-CM | POA: Diagnosis not present

## 2013-08-16 DIAGNOSIS — J45909 Unspecified asthma, uncomplicated: Secondary | ICD-10-CM | POA: Diagnosis not present

## 2013-08-16 LAB — GLUCOSE, CAPILLARY: Glucose-Capillary: 95 mg/dL (ref 70–99)

## 2013-08-16 MED ORDER — TRAMADOL HCL 50 MG PO TABS: 50.0000 mg | ORAL_TABLET | Freq: Four times a day (QID) | ORAL | Status: DC | PRN

## 2013-08-16 MED ORDER — DOCUSATE SODIUM 100 MG PO CAPS: 100.0000 mg | ORAL_CAPSULE | Freq: Every day | ORAL | Status: AC | PRN

## 2013-08-16 MED ORDER — FLUTICASONE-SALMETEROL 250-50 MCG/DOSE IN AEPB: 1.0000 | INHALATION_SPRAY | Freq: Two times a day (BID) | RESPIRATORY_TRACT | Status: DC

## 2013-08-16 MED ORDER — MOMETASONE FUROATE 50 MCG/ACT NA SUSP: 2.0000 | Freq: Every day | NASAL | Status: DC | PRN

## 2013-08-16 NOTE — Assessment & Plan Note (Signed)
-  flu shot given today -pt declines pneumovax, stating he believes he has had this in the past

## 2013-08-16 NOTE — Assessment & Plan Note (Signed)
Will replace fluticasone inhaler with advair inhaler due to insurance coverage.

## 2013-08-16 NOTE — Patient Instructions (Addendum)
General Instructions: Your diabetes and blood pressure are well-controlled!  For your chest pain, given your age, high blood pressure, and diabetes, we are concerned about heart disease.  Your EKG today was normal.  We are referring you to Cardiology for a stress test.  For your nasal congestion, we are prescribing nasonex.  Use 1 puff in each nostril up to once per day as needed for nasal congestion.  For your asthma, we are replacing Fluticasone with Advair, for insurance purposes.  For your shoulder pain, you may take occasional Tramadol.  Take up to 1 tablet every 6 hours as needed for shoulder pain.  Please return for a follow-up visit in 3 months.  Treatment Goals:  Goals (1 Years of Data) as of 08/16/13   None      Progress Toward Treatment Goals:  Treatment Goal 08/16/2013  Hemoglobin A1C at goal  Blood pressure at goal    Self Care Goals & Plans:  Self Care Goal 02/15/2013  Manage my medications refill my medications on time; take my medicines as prescribed  Eat healthy foods eat foods that are low in salt  Be physically active (No Data)    Home Blood Glucose Monitoring 08/16/2013  Check my blood sugar no home glucose monitoring  When to check my blood sugar N/A     Care Management & Community Referrals:  Referral 08/16/2013  Referrals made for care management support none needed

## 2013-08-16 NOTE — Assessment & Plan Note (Signed)
The patient notes occasional R shoulder pain.  He has a history of gout.  No shoulder pain today.  Etiology is unclear.  Pt requests hydrocodone.  I'm concerned about the side effects of this medication.  We should likely avoid NSAIDs given his chronic kidney disease. I will give the patient a small prescription for tramadol, to use as needed for pain, but if pain persists, patient will need reevaluation in clinic during one of these flares.

## 2013-08-16 NOTE — Assessment & Plan Note (Signed)
For occasional nasal congestion, I've prescribed nasonex prn

## 2013-08-16 NOTE — Assessment & Plan Note (Signed)
The patient notes 2 episodes of atypical chest pain in the last few months. He is chest pain-free now, an EKG in the office shows no abnormalities. However, the patient has risk factors of diabetes and hypertension, so given his age, coronary artery disease is on the differential for this pain. -EKG normal today -Will refer to cardiology for stress test

## 2013-08-16 NOTE — Assessment & Plan Note (Signed)
BP Readings from Last 3 Encounters:  08/16/13 125/80  02/15/13 122/80  11/30/12 136/90    Lab Results  Component Value Date   NA 141 05/10/2013   K 4.4 05/10/2013   CREATININE 1.23 05/10/2013    Assessment: Blood pressure control: controlled Progress toward BP goal:  at goal Comments: BP well-controlled  Plan: Medications:  continue current medications Educational resources provided:   Self management tools provided:   Other plans: Recheck at next visit

## 2013-08-16 NOTE — Assessment & Plan Note (Addendum)
Lab Results  Component Value Date   HGBA1C 6.3 08/16/2013   HGBA1C 6.2 02/15/2013   HGBA1C 6.4 11/30/2012     Assessment: Diabetes control: good control (HgbA1C at goal) Progress toward A1C goal:  at goal Comments: A1C = 6.3, well-controlled.  Plan: Medications:  Continue diet/exercise Home glucose monitoring: Frequency: no home glucose monitoring Timing: N/A Instruction/counseling given: discussed diet Educational resources provided:   Self management tools provided:

## 2013-08-16 NOTE — Progress Notes (Signed)
HPI The patient is a 62 y.o. male with a history of DM2, prostate cancer s/p resection, HTN, blindness, CKD, presenting for a visit for chest pain.  The patient has a history of DM.  Last A1C = 6.2.  Pt does not check blood sugar at home.  Diet-controlled.  A1C today is 6.3.  The patient has a history of HTN, BP is well-controlled today.  The patient notes 2 prior episodes of left-sided chest pain, lasting 30 minutes, resolving spontaneously, occurring at rest, unchanged by exertion/rest.  First episode was 3 months ago, second episode was 1 month ago.  Pain was 2/10, tightness, non-radiating, unchanged by palpation or food or change in position.  No associated SOB, nausea, diaphoresis.  No history of heart problems.  FH of MI in mother (age 22) and father (age 77).  The patient is a never smoker.  No chest pain presently.  The patient notes right shoulder pain, which "flares up" every 3-4 months.  No pain now.  He asks for hydrocodone.  He has a history of gout, last uric acid = 3.5.  The patient notes occasional nasal congestion.  He asks for a nasal spray to help with occasional symptoms.  The patient has a history of asthma.  He has been using fluticasone inhaler.  He states his insurance no longer will cover this, and asks for an alternative.  ROS: General: no fevers, chills, changes in weight, changes in appetite Skin: no rash HEENT: no blurry vision, hearing changes, sore throat Pulm: no dyspnea, coughing, wheezing CV: no chest pain, palpitations, shortness of breath Abd: no abdominal pain, nausea/vomiting, diarrhea/constipation GU: no dysuria, hematuria, polyuria Ext: see HPI Neuro: no weakness, numbness, or tingling  Filed Vitals:   08/16/13 1422  BP: 125/80  Pulse: 73  Temp: 97.4 F (36.3 C)    PEX General: alert, cooperative, and in no apparent distress HEENT: pupils equal round and reactive to light, vision grossly intact, oropharynx clear and non-erythematous  Neck:  supple, no lymphadenopathy, JVD, or carotid bruits Lungs: clear to ascultation bilaterally, normal work of respiration, no wheezes, rales, ronchi Heart: regular rate and rhythm, no murmurs, gallops, or rubs Abdomen: soft, non-tender, non-distended, normal bowel sounds Extremities: no cyanosis, clubbing, or edema Neurologic: alert & oriented X3, cranial nerves II-XII intact, strength grossly intact, sensation intact to light touch  Current Outpatient Prescriptions on File Prior to Visit  Medication Sig Dispense Refill  . albuterol (PROVENTIL HFA;VENTOLIN HFA) 108 (90 BASE) MCG/ACT inhaler Inhale 2 puffs into the lungs every 6 (six) hours as needed for wheezing.  1 Inhaler  11  . allopurinol (ZYLOPRIM) 300 MG tablet Take 1 tablet (300 mg total) by mouth 2 (two) times daily.  180 tablet  3  . atenolol (TENORMIN) 25 MG tablet Take 1 tablet (25 mg total) by mouth daily.  90 tablet  3  . atorvastatin (LIPITOR) 40 MG tablet Take 1 tablet (40 mg total) by mouth daily.  30 tablet  11  . fluticasone (FLOVENT HFA) 110 MCG/ACT inhaler Inhale 1 puff into the lungs 2 (two) times daily.  1 Inhaler  11  . losartan (COZAAR) 100 MG tablet TAKE 1 TABLET BY MOUTH ONCE A DAY  90 tablet  4   No current facility-administered medications on file prior to visit.    Assessment/Plan

## 2013-08-17 NOTE — Progress Notes (Signed)
Case discussed with Dr. Brown at the time of the visit.  We reviewed the resident's history and exam and pertinent patient test results.  I agree with the assessment, diagnosis and plan of care documented in the resident's note. 

## 2013-08-21 ENCOUNTER — Ambulatory Visit: Payer: Medicare Other | Admitting: Cardiovascular Disease

## 2013-09-19 ENCOUNTER — Encounter: Payer: Self-pay | Admitting: Cardiovascular Disease

## 2013-09-19 ENCOUNTER — Ambulatory Visit (INDEPENDENT_AMBULATORY_CARE_PROVIDER_SITE_OTHER): Payer: Medicare Other | Admitting: Cardiovascular Disease

## 2013-09-19 VITALS — BP 136/80 | HR 73 | Ht 65.0 in | Wt 230.0 lb

## 2013-09-19 DIAGNOSIS — I10 Essential (primary) hypertension: Secondary | ICD-10-CM

## 2013-09-19 DIAGNOSIS — R0789 Other chest pain: Secondary | ICD-10-CM | POA: Diagnosis not present

## 2013-09-19 DIAGNOSIS — E119 Type 2 diabetes mellitus without complications: Secondary | ICD-10-CM | POA: Diagnosis not present

## 2013-09-19 DIAGNOSIS — H543 Unqualified visual loss, both eyes: Secondary | ICD-10-CM | POA: Diagnosis not present

## 2013-09-19 DIAGNOSIS — H547 Unspecified visual loss: Secondary | ICD-10-CM

## 2013-09-19 NOTE — Progress Notes (Signed)
Patient ID: Austin Anderson, male   DOB: Mar 07, 1951, 63 y.o.   MRN: [id number]   The patient is a 63 y.o. male with a history of DM2, prostate cancer s/p resection, HTN, blindness, CKD, referred by Dr Eppie Gibson  for chest pain.  The patient has a history of DM. Last A1C = 6.2. Pt does not check blood sugar at home. Diet-controlled. A1C today is 6.3.  The patient has a history of HTN, BP is well-controlled today.  The patient notes 2 prior episodes of left-sided chest pain, lasting 30 minutes, resolving spontaneously, occurring at rest, unchanged by exertion/rest. First episode was 3 months ago, second episode was 1 month ago. Pain was 2/10, tightness, non-radiating, unchanged by palpation or food or change in position. No associated SOB, nausea, diaphoresis. No history of heart problems. FH of MI in mother (age 21) and father (age 61). The patient is a never smoker. No chest pain presently.  The patient notes right shoulder pain, which "flares up" every 3-4 months. No pain now. He asks for hydrocodone. He has a history of gout, last uric acid = 3.5.   Pain has been less last 3 weeks.  He would not be able to negotiate treadmill due to blindness  He actually fell in lobby today    ROS: Denies fever, malais, weight loss, blurry vision, decreased visual acuity, cough, sputum, SOB, hemoptysis, pleuritic pain, palpitaitons, heartburn, abdominal pain, melena, lower extremity edema, claudication, or rash.  All other systems reviewed and negative   General: Affect appropriate Overweight blind black male  HEENT: normal Neck supple with no adenopathy JVP normal no bruits no thyromegaly Lungs clear with no wheezing and good diaphragmatic motion Heart:  S1/S2 no murmur,rub, gallop or click PMI normal Abdomen: benighn, BS positve, no tenderness, no AAA no bruit.  No HSM or HJR Distal pulses intact with no bruits No edema Neuro non-focal Skin warm and dry No muscular weakness  Medications Current  Outpatient Prescriptions  Medication Sig Dispense Refill  . albuterol (PROVENTIL HFA;VENTOLIN HFA) 108 (90 BASE) MCG/ACT inhaler Inhale 2 puffs into the lungs every 6 (six) hours as needed for wheezing.  1 Inhaler  11  . allopurinol (ZYLOPRIM) 300 MG tablet Take 1 tablet (300 mg total) by mouth 2 (two) times daily.  180 tablet  3  . atenolol (TENORMIN) 25 MG tablet Take 1 tablet (25 mg total) by mouth daily.  90 tablet  3  . atorvastatin (LIPITOR) 40 MG tablet Take 1 tablet (40 mg total) by mouth daily.  30 tablet  11  . docusate sodium (COLACE) 100 MG capsule Take 1 capsule (100 mg total) by mouth daily as needed.  30 capsule  3  . Fluticasone-Salmeterol (ADVAIR DISKUS) 250-50 MCG/DOSE AEPB Inhale 1 puff into the lungs 2 (two) times daily.  60 each  5  . losartan (COZAAR) 100 MG tablet TAKE 1 TABLET BY MOUTH ONCE A DAY  90 tablet  4  . mometasone (NASONEX) 50 MCG/ACT nasal spray Place 2 sprays into the nose daily as needed.  17 g  0  . traMADol (ULTRAM) 50 MG tablet Take 1 tablet (50 mg total) by mouth every 6 (six) hours as needed.  60 tablet  0   No current facility-administered medications for this visit.    Allergies Review of patient's allergies indicates no known allergies.  Family History: Family History  Problem Relation Age of Onset  . Heart attack Father 49  . Heart disease Mother   .  Diabetes Sister     Social History: History   Social History  . Marital Status: Single    Spouse Name: N/A    Number of Children: N/A  . Years of Education: N/A   Occupational History  . Not on file.   Social History Main Topics  . Smoking status: Never Smoker   . Smokeless tobacco: Not on file  . Alcohol Use: No  . Drug Use: No  . Sexual Activity: Not on file   Other Topics Concern  . Not on file   Social History Narrative   The patient lives alone in [place].  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  The patient is on  medicaid/medicare, and receives a disability check.  The patient has a sister in [place].  The patient has a 10th grade education.  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  The patient lost his vision at age 26, and now can only barely see light, but no colors or shapes.  The patient has an aide who comes to his house every week to help him sort his medications.    Electrocardiogram:  12/3  SR rate 74  Normal   Assessment and Plan

## 2013-09-19 NOTE — Patient Instructions (Signed)
Your physician recommends that you schedule a follow-up appointment in:  AS NEEDED  Your physician recommends that you continue on your current medications as directed. Please refer to the Current Medication list given to you today.  Your physician has requested that you have a lexiscan myoview. For further information please visit [email or web address]. Please follow instruction sheet, as given.  

## 2013-09-19 NOTE — Assessment & Plan Note (Signed)
Atypical  Multiple risk factors including HTN and diabetes  Unable to negotiate ETT due to blindness  F/U lexiscan myovue

## 2013-09-19 NOTE — Assessment & Plan Note (Signed)
Discussed low carb diet.  Target hemoglobin A1c is 6.5 or less.  Continue current medications.  

## 2013-09-19 NOTE — Assessment & Plan Note (Signed)
A shame that unRx glaucoma caused this  Encouraged him to seek more services to make getting around and communicating easier  Has a sister in [place] that contacts him regularly but lives alone  Not interested in [place] companion

## 2013-09-19 NOTE — Assessment & Plan Note (Signed)
Well controlled.  Continue current medications and low sodium Dash type diet.    

## 2013-09-29 ENCOUNTER — Other Ambulatory Visit: Payer: Self-pay | Admitting: Internal Medicine

## 2013-10-03 ENCOUNTER — Ambulatory Visit (HOSPITAL_COMMUNITY): Payer: Medicare Other | Attending: Cardiovascular Disease | Admitting: Radiology

## 2013-10-03 VITALS — BP 119/94 | HR 58 | Ht 65.0 in | Wt 231.0 lb

## 2013-10-03 DIAGNOSIS — Z8249 Family history of ischemic heart disease and other diseases of the circulatory system: Secondary | ICD-10-CM | POA: Insufficient documentation

## 2013-10-03 DIAGNOSIS — R079 Chest pain, unspecified: Secondary | ICD-10-CM

## 2013-10-03 DIAGNOSIS — E785 Hyperlipidemia, unspecified: Secondary | ICD-10-CM | POA: Insufficient documentation

## 2013-10-03 DIAGNOSIS — I10 Essential (primary) hypertension: Secondary | ICD-10-CM | POA: Insufficient documentation

## 2013-10-03 DIAGNOSIS — R0789 Other chest pain: Secondary | ICD-10-CM | POA: Diagnosis not present

## 2013-10-03 DIAGNOSIS — E119 Type 2 diabetes mellitus without complications: Secondary | ICD-10-CM | POA: Insufficient documentation

## 2013-10-03 MED ORDER — REGADENOSON 0.4 MG/5ML IV SOLN
0.4000 mg | Freq: Once | INTRAVENOUS | Status: AC
  Administered 2013-10-03: 0.4 mg via INTRAVENOUS

## 2013-10-03 MED ORDER — TECHNETIUM TC 99M SESTAMIBI GENERIC - CARDIOLITE
33.0000 | Freq: Once | INTRAVENOUS | Status: AC | PRN
  Administered 2013-10-03: 33 via INTRAVENOUS

## 2013-10-03 MED ORDER — TECHNETIUM TC 99M SESTAMIBI GENERIC - CARDIOLITE
10.8000 | Freq: Once | INTRAVENOUS | Status: AC | PRN
  Administered 2013-10-03: 11 via INTRAVENOUS

## 2013-10-03 NOTE — Progress Notes (Signed)
  [facility] [address] [place], [place] [address] [phone]    Cardiology Nuclear Med Study  Austin Anderson is a 63 y.o. male     MRN : [id number]     DOB: 1950-10-06  Procedure Date: 10/03/2013  Nuclear Med Background Indication for Stress Test:  Evaluation for Ischemia History:  No known CAD Cardiac Risk Factors: Family History - CAD, Hypertension, Lipids and NIDDM  Symptoms:  Chest Pain (last date of chest discomfort ws two months ago)   Nuclear Pre-Procedure Caffeine/Decaff Intake:  None NPO After: 7:00pm   Lungs:  clear O2 Sat: 98% on room air. IV 0.9% NS with Angio Cath:  22g  IV Site: R Hand  IV Started by:  Matilde Haymaker, RN  Chest Size (in):  50 Cup Size: n/a  Height: 5\' 5"  (1.651 m)  Weight:  231 lb (104.781 kg)  BMI:  Body mass index is 38.44 kg/(m^2). Tech Comments:  No Atenolol x 24 hrs    Nuclear Med Study 1 or 2 day study: 1 day  Stress Test Type:  Lexiscan  Reading MD: n/a  Order Authorizing Provider:  Verlin Dike  Resting Radionuclide: Technetium 78m Sestamibi  Resting Radionuclide Dose: 11.0 mCi   Stress Radionuclide:  Technetium 60m Sestamibi  Stress Radionuclide Dose: 33.0 mCi           Stress Protocol Rest HR: 58 Stress HR: 82  Rest BP: 119/74 Stress BP: 130/84  Exercise Time (min): n/a METS: n/a           Dose of Adenosine (mg):  n/a Dose of Lexiscan: 0.4 mg  Dose of Atropine (mg): n/a Dose of Dobutamine: n/a mcg/kg/min (at max HR)  Stress Test Technologist: Glade Lloyd, BS-ES  Nuclear Technologist:  Charlton Amor, CNMT     Rest Procedure:  Myocardial perfusion imaging was performed at rest 45 minutes following the intravenous administration of Technetium 65m Sestamibi. Rest ECG: NSR - Normal EKG  Stress Procedure:  The patient received IV Lexiscan 0.4 mg over 15-seconds.  Technetium 59m Sestamibi injected at 30-seconds.  Quantitative spect images were obtained after a 45 minute delay.  The  patient did not have any symptoms with the Lexiscan infusion.  Stress ECG: No significant change from baseline ECG  QPS Raw Data Images:  Mild diaphragmatic attenuation.  Normal left ventricular size. Stress Images:  Normal homogeneous uptake in all areas of the myocardium. Rest Images:  Normal homogeneous uptake in all areas of the myocardium. Subtraction (SDS):  No evidence of ischemia. Transient Ischemic Dilatation (Normal <1.22):  1.04 Lung/Heart Ratio (Normal <0.45):  0.27  Quantitative Gated Spect Images QGS EDV:  101 ml QGS ESV:  28 ml  Impression Exercise Capacity:  Lexiscan with no exercise. BP Response:  Normal blood pressure response. Clinical Symptoms:  No significant symptoms noted. ECG Impression:  No significant ST segment change suggestive of ischemia. Comparison with Prior Nuclear Study: No images to compare  Overall Impression:  Normal stress nuclear study.  LV Ejection Fraction: 72%.  LV Wall Motion:  NL LV Function; NL Wall Motion  Austin Anderson, Austin Anderson 10/03/2013

## 2013-12-24 ENCOUNTER — Other Ambulatory Visit: Payer: Self-pay | Admitting: Internal Medicine

## 2014-01-12 ENCOUNTER — Ambulatory Visit (INDEPENDENT_AMBULATORY_CARE_PROVIDER_SITE_OTHER): Payer: Medicare Other | Admitting: Internal Medicine

## 2014-01-12 ENCOUNTER — Ambulatory Visit (HOSPITAL_COMMUNITY)
Admission: RE | Admit: 2014-01-12 | Discharge: 2014-01-12 | Disposition: A | Discharge status: Home / Self Care | Payer: Medicare Other | Source: Ambulatory Visit | Attending: Internal Medicine | Admitting: Internal Medicine

## 2014-01-12 ENCOUNTER — Encounter: Payer: Self-pay | Admitting: Internal Medicine

## 2014-01-12 VITALS — BP 139/88 | HR 63 | Temp 98.2°F | Ht 65.0 in | Wt 230.0 lb

## 2014-01-12 DIAGNOSIS — R109 Unspecified abdominal pain: Secondary | ICD-10-CM | POA: Diagnosis not present

## 2014-01-12 DIAGNOSIS — M5137 Other intervertebral disc degeneration, lumbosacral region: Secondary | ICD-10-CM | POA: Insufficient documentation

## 2014-01-12 DIAGNOSIS — M109 Gout, unspecified: Secondary | ICD-10-CM | POA: Diagnosis not present

## 2014-01-12 DIAGNOSIS — R0981 Nasal congestion: Secondary | ICD-10-CM

## 2014-01-12 DIAGNOSIS — J3489 Other specified disorders of nose and nasal sinuses: Secondary | ICD-10-CM

## 2014-01-12 DIAGNOSIS — K5792 Diverticulitis of intestine, part unspecified, without perforation or abscess without bleeding: Secondary | ICD-10-CM | POA: Insufficient documentation

## 2014-01-12 DIAGNOSIS — M545 Low back pain, unspecified: Secondary | ICD-10-CM | POA: Insufficient documentation

## 2014-01-12 DIAGNOSIS — E119 Type 2 diabetes mellitus without complications: Secondary | ICD-10-CM | POA: Diagnosis not present

## 2014-01-12 DIAGNOSIS — M51379 Other intervertebral disc degeneration, lumbosacral region without mention of lumbar back pain or lower extremity pain: Secondary | ICD-10-CM | POA: Insufficient documentation

## 2014-01-12 DIAGNOSIS — M47817 Spondylosis without myelopathy or radiculopathy, lumbosacral region: Secondary | ICD-10-CM | POA: Diagnosis not present

## 2014-01-12 LAB — LIPID PANEL
CHOL/HDL RATIO: 2.6 ratio
Cholesterol: 132 mg/dL (ref 0–200)
HDL: 51 mg/dL (ref >39)
LDL Cholesterol: 75 mg/dL (ref 0–99)
TRIGLYCERIDES: 32 mg/dL (ref <150)
VLDL: 6 mg/dL (ref 0–40)

## 2014-01-12 LAB — BASIC METABOLIC PANEL
BUN: 10 mg/dL (ref 6–23)
CO2: 28 meq/L (ref 19–32)
Calcium: 10.2 mg/dL (ref 8.4–10.5)
Chloride: 108 mEq/L (ref 96–112)
Creat: 1.26 mg/dL (ref 0.50–1.35)
GLUCOSE: 133 mg/dL — AB (ref 70–99)
POTASSIUM: 4.3 meq/L (ref 3.5–5.3)
SODIUM: 142 meq/L (ref 135–145)

## 2014-01-12 LAB — CBC
HCT: 38.9 % — ABNORMAL LOW (ref 39.0–52.0)
HEMOGLOBIN: 13.1 g/dL (ref 13.0–17.0)
MCH: 28.7 pg (ref 26.0–34.0)
MCHC: 33.7 g/dL (ref 30.0–36.0)
MCV: 85.3 fL (ref 78.0–100.0)
Platelets: 198 10*3/uL (ref 150–400)
RBC: 4.56 MIL/uL (ref 4.22–5.81)
RDW: 15.2 % (ref 11.5–15.5)
WBC: 7.4 10*3/uL (ref 4.0–10.5)

## 2014-01-12 LAB — URIC ACID: Uric Acid, Serum: 4.7 mg/dL (ref 4.0–7.8)

## 2014-01-12 LAB — POCT GLYCOSYLATED HEMOGLOBIN (HGB A1C): Hemoglobin A1C: 6.1

## 2014-01-12 LAB — GLUCOSE, CAPILLARY: Glucose-Capillary: 121 mg/dL — ABNORMAL HIGH (ref 70–99)

## 2014-01-12 MED ORDER — FLUTICASONE PROPIONATE 50 MCG/ACT NA SUSP: 1.0000 | Freq: Every day | NASAL | Status: DC | PRN

## 2014-01-12 MED ORDER — PANTOPRAZOLE SODIUM 20 MG PO TBEC: 20.0000 mg | DELAYED_RELEASE_TABLET | Freq: Every day | ORAL | Status: DC

## 2014-01-12 MED ORDER — ACETAMINOPHEN ER 650 MG PO TBCR: 650.0000 mg | EXTENDED_RELEASE_TABLET | Freq: Three times a day (TID) | ORAL | Status: DC | PRN

## 2014-01-12 NOTE — Assessment & Plan Note (Addendum)
The patient notes occasional symptoms of abdominal fullness, which resolves with burping. He denies abdominal pain, nausea, vomiting, diarrhea, constipation.  Physical exam is completely benign.  The etiology of his symptoms is unclear, but may represent heartburn. Of note, the patient has recently had a normal nuclear stress test. -will start an empiric trial of protonix daily, and assess for symptom benefit

## 2014-01-12 NOTE — Patient Instructions (Addendum)
General Instructions: For your Diabetes, your A1C today is 6.1, which is perfect.  Continue to make healthy diet and exercise choices.  For your back pain, we are ordering an x-ray of your back.  This is a walk-in procedure in Radiology, on the 1st floor of [facility]. -you may continue to take Tylenol arthritis for pain  For your allergies, we are prescribing Flonase.  Use 1 spray in each nostril daily as needed for allergies.  For your indigestion, we are prescribing Protonix.  Take 1 tablet once per day.  It may take 4 weeks for you to see an effect from this medication.  Please return for a follow-up visit in 6 months.   Treatment Goals:  Goals (1 Years of Data) as of 01/12/14   None      Progress Toward Treatment Goals:  Treatment Goal 01/12/2014  Hemoglobin A1C at goal  Blood pressure at goal    Self Care Goals & Plans:  Self Care Goal 02/15/2013  Manage my medications refill my medications on time; take my medicines as prescribed  Eat healthy foods eat foods that are low in salt  Be physically active (No Data)    Home Blood Glucose Monitoring 01/12/2014  Check my blood sugar no home glucose monitoring  When to check my blood sugar N/A     Care Management & Community Referrals:  Referral 01/12/2014  Referrals made for care management support none needed

## 2014-01-12 NOTE — Progress Notes (Signed)
Case discussed with Dr. Brown at the time of the visit.  We reviewed the resident's history and exam and pertinent patient test results.  I agree with the assessment, diagnosis, and plan of care documented in the resident's note. 

## 2014-01-12 NOTE — Progress Notes (Signed)
HPI The patient is a 63 y.o. male with a history of Prostate cancer s/p radical prostatectomy 2012, DM2, HTN, blindness, CKD, presenting for a follow-up visit for diabetes, but also noting acute complaints of low back pain and indigestion.  The patient notes that 3 weeks ago, he had an episode of low back pain, lasting 1 week, described as a dull low back pain, radiating to bilateral hips, making walking difficult (due to pain).  He notes no LE weakness, numbness, or tingling, and no loss of bowel/bladder control.  He notes no preceding trauma, no heavy lifting.  He took Tylenol arthritis, which helped the pain some.  Symptoms resolved after 1 week.  He notes similar episodes for "years" in the past.  Chart review reveals no x-ray in the past.  The patient also notes some mild "indigestion", described as a fullness in his stomach and chest, which resolves with burping.  He notes no abdominal pain, diarrhea, or constipation.  He notes no significant change in symptoms with food.  He notes taking colace, with no change in symptoms.  History of DM, diet-controlled, A1C today is 6.1.  ROS: General: no fevers, chills, changes in weight, changes in appetite Skin: no rash HEENT: nohearing changes, sore throat Pulm: no dyspnea, coughing, wheezing CV: no chest pain, palpitations, shortness of breath Abd: see HPI GU: no dysuria, hematuria, polyuria Ext: no arthralgias, myalgias Neuro: no weakness, numbness, or tingling  Filed Vitals:   01/12/14 1038  BP: 139/88  Pulse: 63  Temp: 98.2 F (36.8 C)    PEX General: alert, cooperative, and in no apparent distress HEENT: oropharynx clear and non-erythematous Neck: supple Lungs: clear to ascultation bilaterally, normal work of respiration, no wheezes, rales, ronchi Heart: regular rate and rhythm, no murmurs, gallops, or rubs Abdomen: soft, non-tender, non-distended, normal bowel sounds Extremities: no cyanosis, clubbing, or edema Back: Minimal ttp  of lumbar spine, no ttp of lumbar paraspinal muscles Neurologic: alert & oriented X3, cranial nerves II-XII intact, strength grossly intact, sensation intact to light touch  Current Outpatient Prescriptions on File Prior to Visit  Medication Sig Dispense Refill  . albuterol (PROVENTIL HFA;VENTOLIN HFA) 108 (90 BASE) MCG/ACT inhaler Inhale 2 puffs into the lungs every 6 (six) hours as needed for wheezing.  1 Inhaler  11  . allopurinol (ZYLOPRIM) 300 MG tablet TAKE 1 TABLET BY MOUTH TWICE A DAY  180 tablet  3  . atenolol (TENORMIN) 25 MG tablet TAKE 1 TABLET BY MOUTH EVERY DAY  90 tablet  3  . atorvastatin (LIPITOR) 40 MG tablet Take 1 tablet (40 mg total) by mouth daily.  30 tablet  11  . docusate sodium (COLACE) 100 MG capsule Take 1 capsule (100 mg total) by mouth daily as needed.  30 capsule  3  . Fluticasone-Salmeterol (ADVAIR DISKUS) 250-50 MCG/DOSE AEPB Inhale 1 puff into the lungs 2 (two) times daily.  60 each  5  . losartan (COZAAR) 100 MG tablet TAKE 1 TABLET BY MOUTH ONCE A DAY  90 tablet  4  . NASONEX 50 MCG/ACT nasal spray PLACE 2 SPRAYS INTO THE NOSE DAILY AS NEEDED.  17 g  1  . traMADol (ULTRAM) 50 MG tablet Take 1 tablet (50 mg total) by mouth every 6 (six) hours as needed.  60 tablet  0   No current facility-administered medications on file prior to visit.    Assessment/Plan

## 2014-01-12 NOTE — Assessment & Plan Note (Signed)
The patient notes a history of low back pain, which comes and goes, with most frequent episode of pain 3 weeks ago. The pain resolved with Tylenol arthritis. The patient has a history of prostate cancer, status post prostatectomy in 2012, but after speaking with the patient's urologist's office, his last PSA 5 months ago was undetectable, and he is due for a repeat PSA at his next visit next month. I highly doubt that his back pain represents a bony metastasis, but we will keep this possibility in mind, and await the results of his next PSA level. -Check lumbar spine x-ray -Continue take Tylenol arthritis as needed for pain -If patient needs something stronger for pain, we can consider trying tramadol

## 2014-01-12 NOTE — Assessment & Plan Note (Signed)
Lab Results  Component Value Date   HGBA1C 6.1 01/12/2014   HGBA1C 6.3 08/16/2013   HGBA1C 6.2 02/15/2013     Assessment: Diabetes control: good control (HgbA1C at goal) Progress toward A1C goal:  at goal Comments: A1C well-controlled without medication  Plan: Medications:  Continue diet/exercise Home glucose monitoring: Frequency: no home glucose monitoring Timing: N/A Instruction/counseling given: discussed foot care Educational resources provided:   Self management tools provided:   Other plans: recheck at next visit

## 2014-01-12 NOTE — Assessment & Plan Note (Signed)
The patient continues to note symptoms of intermittent nasal congestion, which he believes is related to pollen. His insurance won't pay for Nasonex. -We will try Flonase nasal spray as needed

## 2014-01-13 LAB — MICROALBUMIN / CREATININE URINE RATIO
CREATININE, URINE: 228.2 mg/dL
Microalb Creat Ratio: 7.2 mg/g (ref 0.0–30.0)
Microalb, Ur: 1.64 mg/dL (ref 0.00–1.89)

## 2014-02-14 DIAGNOSIS — C61 Malignant neoplasm of prostate: Secondary | ICD-10-CM | POA: Diagnosis not present

## 2014-02-14 DIAGNOSIS — N529 Male erectile dysfunction, unspecified: Secondary | ICD-10-CM | POA: Diagnosis not present

## 2014-06-11 ENCOUNTER — Other Ambulatory Visit: Payer: Self-pay | Admitting: *Deleted

## 2014-06-12 MED ORDER — PANTOPRAZOLE SODIUM 20 MG PO TBEC: 20.0000 mg | DELAYED_RELEASE_TABLET | Freq: Every day | ORAL | Status: DC

## 2014-08-01 ENCOUNTER — Telehealth: Payer: Self-pay | Admitting: Internal Medicine

## 2014-08-01 ENCOUNTER — Ambulatory Visit (INDEPENDENT_AMBULATORY_CARE_PROVIDER_SITE_OTHER): Payer: Medicare Other | Admitting: Internal Medicine

## 2014-08-01 ENCOUNTER — Ambulatory Visit (INDEPENDENT_AMBULATORY_CARE_PROVIDER_SITE_OTHER): Payer: Medicare Other | Admitting: *Deleted

## 2014-08-01 ENCOUNTER — Encounter: Payer: Self-pay | Admitting: Internal Medicine

## 2014-08-01 ENCOUNTER — Ambulatory Visit (HOSPITAL_COMMUNITY)
Admission: RE | Admit: 2014-08-01 | Discharge: 2014-08-01 | Disposition: A | Discharge status: Home / Self Care | Payer: Medicare Other | Source: Ambulatory Visit | Attending: Internal Medicine | Admitting: Internal Medicine

## 2014-08-01 VITALS — BP 127/76 | HR 63 | Temp 97.6°F | Ht 65.0 in | Wt 233.4 lb

## 2014-08-01 DIAGNOSIS — M25512 Pain in left shoulder: Secondary | ICD-10-CM | POA: Diagnosis not present

## 2014-08-01 DIAGNOSIS — M25511 Pain in right shoulder: Secondary | ICD-10-CM

## 2014-08-01 DIAGNOSIS — R1013 Epigastric pain: Secondary | ICD-10-CM | POA: Diagnosis not present

## 2014-08-01 DIAGNOSIS — M19012 Primary osteoarthritis, left shoulder: Secondary | ICD-10-CM | POA: Diagnosis not present

## 2014-08-01 DIAGNOSIS — Z23 Encounter for immunization: Secondary | ICD-10-CM | POA: Diagnosis not present

## 2014-08-01 DIAGNOSIS — Z0000 Encounter for general adult medical examination without abnormal findings: Secondary | ICD-10-CM

## 2014-08-01 DIAGNOSIS — I10 Essential (primary) hypertension: Secondary | ICD-10-CM | POA: Diagnosis not present

## 2014-08-01 DIAGNOSIS — E119 Type 2 diabetes mellitus without complications: Secondary | ICD-10-CM | POA: Diagnosis not present

## 2014-08-01 DIAGNOSIS — M19011 Primary osteoarthritis, right shoulder: Secondary | ICD-10-CM | POA: Diagnosis not present

## 2014-08-01 DIAGNOSIS — M25519 Pain in unspecified shoulder: Secondary | ICD-10-CM

## 2014-08-01 LAB — CBC WITH DIFFERENTIAL/PLATELET
BASOS ABS: 0 10*3/uL (ref 0.0–0.1)
Basophils Relative: 0 % (ref 0–1)
Eosinophils Absolute: 0.4 10*3/uL (ref 0.0–0.7)
Eosinophils Relative: 5 % (ref 0–5)
HEMATOCRIT: 39.9 % (ref 39.0–52.0)
Hemoglobin: 13.4 g/dL (ref 13.0–17.0)
LYMPHS PCT: 32 % (ref 12–46)
Lymphs Abs: 2.3 10*3/uL (ref 0.7–4.0)
MCH: 28.8 pg (ref 26.0–34.0)
MCHC: 33.6 g/dL (ref 30.0–36.0)
MCV: 85.8 fL (ref 78.0–100.0)
MONO ABS: 0.5 10*3/uL (ref 0.1–1.0)
MPV: 10.6 fL (ref 9.4–12.4)
Monocytes Relative: 7 % (ref 3–12)
NEUTROS ABS: 4.1 10*3/uL (ref 1.7–7.7)
Neutrophils Relative %: 56 % (ref 43–77)
PLATELETS: 226 10*3/uL (ref 150–400)
RBC: 4.65 MIL/uL (ref 4.22–5.81)
RDW: 15.7 % — ABNORMAL HIGH (ref 11.5–15.5)
WBC: 7.3 10*3/uL (ref 4.0–10.5)

## 2014-08-01 LAB — COMPLETE METABOLIC PANEL WITH GFR
ALK PHOS: 47 U/L (ref 39–117)
ALT: 17 U/L (ref 0–53)
AST: 20 U/L (ref 0–37)
Albumin: 4.1 g/dL (ref 3.5–5.2)
BUN: 12 mg/dL (ref 6–23)
CO2: 22 mEq/L (ref 19–32)
CREATININE: 1.19 mg/dL (ref 0.50–1.35)
Calcium: 9.5 mg/dL (ref 8.4–10.5)
Chloride: 108 mEq/L (ref 96–112)
GFR, EST AFRICAN AMERICAN: 76 mL/min
GFR, Est Non African American: 66 mL/min
Glucose, Bld: 124 mg/dL — ABNORMAL HIGH (ref 70–99)
Potassium: 4 mEq/L (ref 3.5–5.3)
Sodium: 141 mEq/L (ref 135–145)
Total Bilirubin: 0.7 mg/dL (ref 0.2–1.2)
Total Protein: 7.2 g/dL (ref 6.0–8.3)

## 2014-08-01 LAB — POCT GLYCOSYLATED HEMOGLOBIN (HGB A1C): HEMOGLOBIN A1C: 6.9

## 2014-08-01 LAB — LIPASE: Lipase: 19 U/L (ref 0–75)

## 2014-08-01 LAB — GLUCOSE, CAPILLARY: GLUCOSE-CAPILLARY: 126 mg/dL — AB (ref 70–99)

## 2014-08-01 MED ORDER — PANTOPRAZOLE SODIUM 40 MG PO TBEC: 40.0000 mg | DELAYED_RELEASE_TABLET | Freq: Every day | ORAL | Status: DC

## 2014-08-01 MED ORDER — MELOXICAM 7.5 MG PO TABS: 7.5000 mg | ORAL_TABLET | Freq: Every day | ORAL | Status: DC

## 2014-08-01 NOTE — Progress Notes (Signed)
Internal Medicine Clinic Attending  I saw and evaluated the patient.  I personally confirmed the key portions of the history and exam documented by Dr. Rothman and I reviewed pertinent patient test results.  The assessment, diagnosis, and plan were formulated together and I agree with the documentation in the resident's note. 

## 2014-08-01 NOTE — Assessment & Plan Note (Signed)
See HPI. This is likely arthritic changes with aging given chronicity, reproducibility with certain motions and bearing weight on shoulders (ie leaning). Tylenol is not helpful and given ok creatinie (1.26 in 01/2014) we will do one month trial of mobic 7.5 mg daily and reassess. Given renal function, would not want long term NSAID. We will also get films of both shoulders. Although fracture is unlikely, he has history of prostate cancer and will rule out bone lesions -b/l shoulder films -meloxicam 7.5 mg daily prn x 1 month

## 2014-08-01 NOTE — Assessment & Plan Note (Signed)
BP Readings from Last 3 Encounters:  08/01/14 127/76  01/12/14 139/88  10/03/13 119/94    Lab Results  Component Value Date   NA 142 01/12/2014   K 4.3 01/12/2014   CREATININE 1.26 01/12/2014    Assessment: Blood pressure control: controlled Progress toward BP goal:  at goal Comments: well controlled  Plan: Medications:  continue current medications losartan 100 mg daily, atenolol 25 mg daily Educational resources provided:   Self management tools provided:   Other plans: none

## 2014-08-01 NOTE — Patient Instructions (Addendum)
It was a pleasure to see you today. Please take pantoprazole 40 mg daily. This means you will take two of the 20 mg pills until you refill the prescription for 40 mg pills (and then take one a day). I will call you to go over the results of your blood work and shoulder x-rays and take one of mobic daily as needed for the pain. Please go to the GI doctor for your colonoscopy and belly discomfort.  Please return to clinic or seek medical attention if you have any new or worsening abdominal pain, fevers, nausea, or other worrisome medical condition. We look forward to seeing you again in 1 month.  Lottie Mussel, MD  General Instructions:   Please try to bring all your medicines next time. This will help [place] keep you safe from mistakes.   Progress Toward Treatment Goals:  Treatment Goal 08/01/2014  Hemoglobin A1C deteriorated  Blood pressure at goal    Self Care Goals & Plans:  Self Care Goal 08/01/2014  Manage my medications take my medicines as prescribed; bring my medications to every visit; refill my medications on time  Monitor my health check my feet daily; keep track of my blood glucose; bring my glucose meter and log to each visit  Eat healthy foods eat more vegetables; eat foods that are low in salt; eat baked foods instead of fried foods  Be physically active find an activity I enjoy; take a walk every day    Home Blood Glucose Monitoring 01/12/2014  Check my blood sugar no home glucose monitoring  When to check my blood sugar N/A     Care Management & Community Referrals:  Referral 01/12/2014  Referrals made for care management support none needed

## 2014-08-01 NOTE — Progress Notes (Signed)
   Subjective:    Patient ID: Austin Anderson, male    DOB: November 15, 1950, 63 y.o.   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 His complaints today are shoulder pain and abdominal discomfort.   He says the shoulders have hurt for the past few years but have gotten gradually worse over the past couple of months. He says it is a dull pain and it feels like it is "coming out of the socket." It is worse in the left than right. He says some movements can make it worse. He has tried tylenol but that has not helped. Denies trauma  He also notes intermittent abdominal discomfort. It is just below his navel and does not radiate. He describes it as dull and does not think it changes when he eats. It can be there for a few days and then come back several weeks later. This has been happening for over a year. Protonix 20 mg daily does not help. No alcohol, illicit drugs. He denies any nausea, emesis, diarrhea, constipation, fevers, chills, night sweats, chest pain, SOB.  Review of Systems  Constitutional: Negative for fever, chills, diaphoresis, appetite change and unexpected weight change.  Respiratory: Negative for cough and shortness of breath.   Cardiovascular: Negative for chest pain and palpitations.  Gastrointestinal: Positive for abdominal pain. Negative for nausea, vomiting, diarrhea and constipation.  Musculoskeletal: Positive for arthralgias.  Neurological: Negative for dizziness, syncope, weakness, light-headedness, numbness and headaches.       Objective:   Physical Exam  Constitutional: He is oriented to person, place, and time. He appears well-developed and well-nourished. No distress.  HENT:  Head: Normocephalic and atraumatic.  Mouth/Throat: Oropharynx is clear and moist.  Eyes:  Dark sunglasses on  Cardiovascular: Normal rate, regular rhythm,  normal heart sounds and intact distal pulses.  Exam reveals no gallop and no friction rub.   No murmur heard. Pulmonary/Chest: Effort normal and breath sounds normal. No respiratory distress. He has no wheezes.  Abdominal: Soft. Bowel sounds are normal. He exhibits no distension. There is no tenderness.  Several well healed small incision scars from prostatectomy  Musculoskeletal:  No focal point tenderness on either shoulder. Negative Neers test. Full ROM with mild difuse aching on full extension and abduction of both shoulders. No evidence of trauma  Neurological: He is alert and oriented to person, place, and time.  Skin: He is not diaphoretic.  Vitals reviewed.         Assessment & Plan:

## 2014-08-01 NOTE — Assessment & Plan Note (Signed)
See HPI. Abdomen normal on exam. Only 5 lbs weight gain in one year. Etiology unclear.  History is not suggestive of GERD but may be a gastritis v PUD. There is some controversy on whether this is a location with high prevalence of H pylori so will hold off testing for now. He had been on protonix 20 mg daily but will increase. He is also due for colonoscopy so will get GI referral. Differential diagnosis also includes but is unlikely pancreatitis, gallstones, recurrent gastroenteritis. -CMP, CBC, lipase -increase pantoprazole to 40 mg daily -GI referral

## 2014-08-01 NOTE — Assessment & Plan Note (Signed)
Lab Results  Component Value Date   HGBA1C 6.9 08/01/2014   HGBA1C 6.1 01/12/2014   HGBA1C 6.3 08/16/2013     Assessment: Diabetes control: good control (HgbA1C at goal) Progress toward A1C goal:  deteriorated Comments: Still at goal but trending up. He is currently diet controlled and says he has been having larger portions and not watching what he eats as much. I stressed the importance of diet and activity to keep him off any additional medications. This had been checked q6 months but would like it in 3 months.   Plan: Medications:  none Home glucose monitoring: Frequency:   Timing:   Instruction/counseling given: reminded to bring medications to each visit and discussed diet Educational resources provided:   Self management tools provided:   Other plans: recheck A1c in 3 months

## 2014-08-01 NOTE — Telephone Encounter (Signed)
I called Austin Anderson at 5:45 pm to tell him the results of his shoulder x-rays. I told him they were arthritic changes and there were no fractures or malignancy. We will continue with the plan documented in today's note. He had no questions.  Lottie Mussel, MD 08/01/14 5:47 pm

## 2014-08-01 NOTE — Assessment & Plan Note (Signed)
Austin Anderson received flu shot today and referral to GI for colonoscopy

## 2014-08-17 ENCOUNTER — Other Ambulatory Visit: Payer: Self-pay | Admitting: Internal Medicine

## 2014-08-28 DIAGNOSIS — R1030 Lower abdominal pain, unspecified: Secondary | ICD-10-CM | POA: Diagnosis not present

## 2014-08-28 DIAGNOSIS — K30 Functional dyspepsia: Secondary | ICD-10-CM | POA: Diagnosis not present

## 2014-08-31 ENCOUNTER — Ambulatory Visit: Payer: Medicare Other | Admitting: Internal Medicine

## 2014-09-04 ENCOUNTER — Encounter: Payer: Self-pay | Admitting: Internal Medicine

## 2014-09-04 ENCOUNTER — Ambulatory Visit (INDEPENDENT_AMBULATORY_CARE_PROVIDER_SITE_OTHER): Payer: Medicare Other | Admitting: Internal Medicine

## 2014-09-04 VITALS — BP 147/88 | HR 80 | Temp 98.4°F | Ht 65.0 in | Wt 230.3 lb

## 2014-09-04 DIAGNOSIS — M25511 Pain in right shoulder: Secondary | ICD-10-CM | POA: Diagnosis not present

## 2014-09-04 DIAGNOSIS — R1013 Epigastric pain: Secondary | ICD-10-CM | POA: Diagnosis not present

## 2014-09-04 DIAGNOSIS — I10 Essential (primary) hypertension: Secondary | ICD-10-CM

## 2014-09-04 DIAGNOSIS — M25519 Pain in unspecified shoulder: Secondary | ICD-10-CM

## 2014-09-04 DIAGNOSIS — M25512 Pain in left shoulder: Secondary | ICD-10-CM | POA: Diagnosis not present

## 2014-09-04 DIAGNOSIS — M79672 Pain in left foot: Secondary | ICD-10-CM

## 2014-09-04 DIAGNOSIS — E119 Type 2 diabetes mellitus without complications: Secondary | ICD-10-CM

## 2014-09-04 LAB — GLUCOSE, CAPILLARY: Glucose-Capillary: 116 mg/dL — ABNORMAL HIGH (ref 70–99)

## 2014-09-04 MED ORDER — TRAMADOL HCL 50 MG PO TABS: 50.0000 mg | ORAL_TABLET | Freq: Every day | ORAL | Status: DC

## 2014-09-04 MED ORDER — PANTOPRAZOLE SODIUM 20 MG PO TBEC: 20.0000 mg | DELAYED_RELEASE_TABLET | Freq: Every day | ORAL | Status: DC

## 2014-09-04 NOTE — Patient Instructions (Signed)
General Instructions:  1. Please schedule a follow up appointment for 6-8 weeks.  2. Please take all medications as prescribed. Take Tramadol 50 mg daily for shoulder pain. STOP taking Mobic.   3. If you have worsening of your symptoms or new symptoms arise, please call the clinic ([phone]), or go to the ER immediately if symptoms are severe.  You have done a great job in taking all your medications. I appreciate it very much. Please continue doing that.   Please bring your medicines with you each time you come to clinic.  Medicines may include prescription medications, over-the-counter medications, herbal remedies, eye drops, vitamins, or other pills.   Progress Toward Treatment Goals:  Treatment Goal 09/04/2014  Hemoglobin A1C at goal  Blood pressure at goal  Prevent falls at goal    Self Care Goals & Plans:  Self Care Goal 09/04/2014  Manage my medications take my medicines as prescribed; bring my medications to every visit; refill my medications on time  Monitor my health -  Eat healthy foods drink diet soda or water instead of juice or soda; eat more vegetables; eat foods that are low in salt; eat baked foods instead of fried foods; eat fruit for snacks and desserts  Be physically active -  Meeting treatment goals maintain the current self-care plan    Home Blood Glucose Monitoring 09/04/2014  Check my blood sugar no home glucose monitoring  When to check my blood sugar N/A     Care Management & Community Referrals:  Referral 09/04/2014  Referrals made for care management support none needed  Referrals made to community resources none

## 2014-09-04 NOTE — Assessment & Plan Note (Signed)
Still having dyspepsia, but says it is okay. Had increased dose of Protonix to 40 mg daily but claims this has caused diarrhea. Has seen Dr. Penelope Coop (GI) recently but states that his dyspepsia was not addressed.  -Continue Protonix 20 mg daily for now -Consider H. Pylori testing if discomfort continues, or consider changing therapy; Nexium? -Appointment w/ Dr. Penelope Coop for colonoscopy in 09/2014.

## 2014-09-04 NOTE — Assessment & Plan Note (Signed)
Patient says it feels like he stepped on a piece of glass and claims it feels sharp when he steps on a certain part of his foot. On exam, NO obvious foreign body, ulcers, wounds, swelling, tenderness, or erythema. Foot appears very clean, all skin is completely intact. Says it is not causing him too much discomfort, only at certain times he feels some pain in the arch of his foot.  -No management at this time as no specific findings on exam, only mild complaint.  -If this continues to be an issue, consider podiatry referral especially given his h/o DM type II.

## 2014-09-04 NOTE — Assessment & Plan Note (Addendum)
Still complaining of bilateral shoulder pain, says the Mobic 7.5 mg daily has not helped. Previous plain films only significant for mild degenerative disease in the glenohumeral joint bilaterally. No significant limitations of ROM, only pain when arms are raised overhead. Only mild tenderness to the anterior portion of the shoulder bilaterally. Discussed Sports Medicine referral and possible joint injection, deferred at this time. Suspect patient has rotator cuff strain, however, patient denies any recent traumatic injury or fall that would result in strain.  -Will stop Mobic for now given poor control of pain (and CKD); start Tramadol 50 mg daily for pain control.  -If no improvement, can consider joint injection in the clinic OR Sports Medicine referral at next visit -Encouraged heat and decreased activity (specifically overhead lifting).

## 2014-09-04 NOTE — Progress Notes (Signed)
Subjective:   Patient ID: Austin Anderson male   DOB: [birth year] 63 y.o.   MRN: [id number]  HPI: Austin Anderson is a 63 y.o. male w/ PMHx of bilateral blindness, Prostate CA s/p prostatectomy 2012, DM type II, HTN, Asthma, and Gout, presents to the clinic today for a follow-up visit. The patient was last seen on 08/01/14 for complaints of dyspepsia and bilateral shoulder pain. Since that time he says his shoulder pain is only slightly better and the Mobic 7.5 mg daily is not helping very much with this plain films of both shoulders performed at that time were only significant for mild degenerative changes. No recent fever, shills, or rash. He does not have any limitations in range of motion but does have some pain when he raises his arms over his head.  His dyspepsia is just about the same, according to the patient. He had his dose of Protonix increased to 40 mg daily during his last visit, but states this has given him some diarrhea when he takes that does. He says he has gone back to 20 mg daily which has been helping somewhat. The patient was given a referral to see Dr. Penelope Coop (GI) and he went recently but it seems his dyspepsia was not really addressed. The patient is scheduled for colonoscopy in 09/2014.  Lastly, the patient says he is having some pain in the arch of his left foot. He states taht he thinks there is "glass" stuck in it because when he steps on a certain part of his foot it feels sharp. On exam, no obvious foreign body, swelling, erythema, or tenderness.   Past Medical History  Diagnosis Date  . Prostate cancer     Prostatic adenocarcinoma, Gleason score 7 (3+4), s/p radical prostatectomy 12/04/10 by Dr. [name].   . Diabetes mellitus, type 2   . Hypertension   . Asthma     Reported by patient's last physician before coming to [facility].  No PFT's on file.   . Gout   . Diverticulosis    Current Outpatient Prescriptions  Medication Sig Dispense Refill  . acetaminophen (TYLENOL) 650 MG  CR tablet Take 1 tablet (650 mg total) by mouth every 8 (eight) hours as needed for pain. 60 tablet 3  . albuterol (PROVENTIL HFA;VENTOLIN HFA) 108 (90 BASE) MCG/ACT inhaler Inhale 2 puffs into the lungs every 6 (six) hours as needed for wheezing. 1 Inhaler 11  . allopurinol (ZYLOPRIM) 300 MG tablet TAKE 1 TABLET BY MOUTH TWICE A DAY 180 tablet 3  . atenolol (TENORMIN) 25 MG tablet TAKE 1 TABLET BY MOUTH EVERY DAY 90 tablet 3  . atorvastatin (LIPITOR) 40 MG tablet Take 1 tablet (40 mg total) by mouth daily. 30 tablet 11  . fluticasone (FLONASE) 50 MCG/ACT nasal spray Place 1 spray into both nostrils daily as needed for allergies or rhinitis. 9.9 g 2  . Fluticasone-Salmeterol (ADVAIR DISKUS) 250-50 MCG/DOSE AEPB Inhale 1 puff into the lungs 2 (two) times daily. 60 each 5  . losartan (COZAAR) 100 MG tablet TAKE 1 TABLET BY MOUTH ONCE A DAY 90 tablet 2  . pantoprazole (PROTONIX) 40 MG tablet Take 1 tablet (40 mg total) by mouth daily. 90 tablet 3  . traMADol (ULTRAM) 50 MG tablet Take 1 tablet (50 mg total) by mouth daily. 60 tablet 0   No current facility-administered medications for this visit.    Review of Systems: General: Denies fever, chills, diaphoresis, appetite change and fatigue.  Respiratory: Denies  SOB, DOE, cough, and wheezing.   Cardiovascular: Denies chest pain and palpitations.  Gastrointestinal: Denies nausea, vomiting, abdominal pain, and diarrhea.  Genitourinary: Denies dysuria, increased frequency, and flank pain. Endocrine: Denies hot or cold intolerance, polyuria, and polydipsia. Musculoskeletal: Positive for bilateral shoulder pain. Denies myalgias, back pain, joint swelling, and gait problem.  Skin: Denies pallor, rash and wounds.  Neurological: Positive for blindness. Denies dizziness, seizures, syncope, weakness, lightheadedness, numbness and headaches.  Psychiatric/Behavioral: Denies mood changes, and sleep disturbances.   Objective:   Physical Exam: Filed  Vitals:   09/04/14 0938  BP: 147/88  Pulse: 80  Temp: 98.4 F (36.9 C)  TempSrc: Oral  Height: 5\' 5"  (1.651 m)  Weight: 230 lb 4.8 oz (104.463 kg)  SpO2: 100%    General: Very pleasant, overweight AA male, alert, cooperative, NAD. Blind.  HEENT: PERRL, EOMI. Moist mucus membranes Neck: Full range of motion without pain, supple, no lymphadenopathy or carotid bruits Lungs: Clear to ascultation bilaterally, normal work of respiration, no wheezes, rales, rhonchi Heart: RRR, no murmurs, gallops, or rubs Abdomen: Soft, non-tender, non-distended, BS + Extremities: No cyanosis, clubbing, or edema. Left foot examined; no obvious ulcers, wounds, or lesions. No erythema, swelling, or foreign body identified. No tenderness to palpation over the arch of the foot, no masses or induration noted.  Neurologic: Alert & oriented x3. Complete bilateral blindness, otherwise, cranial nerves generally intact, strength grossly intact, sensation intact to light touch   Assessment & Plan:   Please see problem based assessment and plan.

## 2014-09-04 NOTE — Assessment & Plan Note (Signed)
BP Readings from Last 3 Encounters:  09/04/14 147/88  08/01/14 127/76  01/12/14 139/88    Lab Results  Component Value Date   NA 141 08/01/2014   K 4.0 08/01/2014   CREATININE 1.19 08/01/2014    Assessment: Blood pressure control: mildly elevated Progress toward BP goal:  at goal Comments: Taking Cozaar 100 mg daily + Atenolol 25 mg daily.   Plan: Medications:  continue current medications  Other plans: RTC in 6-8 weeks.

## 2014-09-05 NOTE — Progress Notes (Signed)
Internal Medicine Clinic Attending  Case discussed with Dr. Jones soon after the resident saw the patient.  We reviewed the resident's history and exam and pertinent patient test results.  I agree with the assessment, diagnosis, and plan of care documented in the resident's note. 

## 2014-09-18 ENCOUNTER — Encounter: Payer: Self-pay | Admitting: Internal Medicine

## 2014-09-18 ENCOUNTER — Ambulatory Visit (INDEPENDENT_AMBULATORY_CARE_PROVIDER_SITE_OTHER): Payer: Medicare Other | Admitting: Internal Medicine

## 2014-09-18 VITALS — BP 124/82 | HR 68 | Temp 97.6°F | Wt 231.0 lb

## 2014-09-18 DIAGNOSIS — R1013 Epigastric pain: Secondary | ICD-10-CM

## 2014-09-18 DIAGNOSIS — M25519 Pain in unspecified shoulder: Secondary | ICD-10-CM | POA: Diagnosis not present

## 2014-09-18 MED ORDER — KETOROLAC TROMETHAMINE 30 MG/ML IJ SOLN
30.0000 mg | Freq: Once | INTRAMUSCULAR | Status: AC
  Administered 2014-09-18: 30 mg via INTRAMUSCULAR

## 2014-09-18 MED ORDER — HYDROCODONE-ACETAMINOPHEN 5-325 MG PO TABS: 1.0000 | ORAL_TABLET | Freq: Four times a day (QID) | ORAL | Status: DC | PRN

## 2014-09-18 NOTE — Assessment & Plan Note (Signed)
Pain not improved on tramadol.  Some evidence of arthritis on his x-rays, which appears to be the cause.  No obvious muscle or ligamental tear.  He says he has taken Vicodin before without issues. -Referred to sport medicine, appointment scheduled for 09/28/14. -Toradol 30 mg injection today. -Vicodin 5-325 mg q6h PRN, #30 to tie him over until his sports medicine appointment.

## 2014-09-18 NOTE — Progress Notes (Signed)
   Subjective:    Patient ID: Austin Anderson, male    DOB: [birth year], 64 y.o.   MRN: [id number]  HPI Austin Anderson is a 64 year old man with history of bilateral blindness, prostate cancer s/p prostatectomy, DM2, HTN, asthma, and gout presenting for follow up of his bilateral shoulder pain.  He reports having shoulder pain for the last 3-4 months, worse in his left shoulder.  He has tried Mobic 7.5 mg daily without relief, and he was switched to tramadol on 09/04/14.  Today, he reports continued pain in his left shoulder despite taking tramadol.  He reports having similar pain several years ago, and he had a cortisone shot, which helped.  The pain is worse when it is still for awhile and he tries to move his arm.  He denies fevers, chills, chest pain, or palpitations.  He says his abdominal pain has improved, and he is scheduled for a colonoscopy at the end of the month.  Review of Systems  Constitutional: Negative for fever, chills and diaphoresis.  HENT: Negative for congestion, rhinorrhea and sore throat.   Respiratory: Negative for shortness of breath.   Cardiovascular: Negative for chest pain.  Gastrointestinal: Negative for nausea, vomiting, abdominal pain, diarrhea and constipation.  Genitourinary: Negative for dysuria and difficulty urinating.  Musculoskeletal: Positive for myalgias and arthralgias.  Skin: Negative for rash.       Objective:   Physical Exam  Constitutional: He is oriented to person, place, and time. He appears well-developed and well-nourished. No distress.  HENT:  Head: Normocephalic and atraumatic.  Eyes:  Blind.  Neck: Normal range of motion.  Cardiovascular: Normal rate, regular rhythm and normal heart sounds.   Pulmonary/Chest: Effort normal and breath sounds normal. No respiratory distress.  Abdominal: Soft. Bowel sounds are normal. He exhibits no distension. There is no tenderness.  Musculoskeletal: Normal range of motion. He exhibits tenderness (Mild  tenderness to palpation of left shoulder.). He exhibits no edema.  Full range of motion in both shoulders.  Pain with active motion of left shoulder, less with passive motion.  No deformities present.  Neurological: He is alert and oriented to person, place, and time. A cranial nerve deficit is present. He exhibits normal muscle tone.  Skin: Skin is warm and dry. No rash noted. No erythema.          Assessment & Plan:  Please see problem-based assessment and plan.

## 2014-09-18 NOTE — Assessment & Plan Note (Signed)
Significantly improved.  He is having minimal symptoms now. -Continue with colonoscopy later this month. -Continue Protonix 20 mg daily.

## 2014-09-18 NOTE — Patient Instructions (Signed)
Thank you for coming to clinic today Austin Anderson.  General instructions: -Please go to your appointment with Sports Medicine on January 15th. -In the meantime, I wrote you a prescription for Vicodin, which is stronger than tramadol. -Please make a follow up appointment to return to clinic in 2 months.  Please bring your medicines with you each time you come.   Medicines may be  Eye drops  Herbal   Vitamins  Pills  Seeing these help [place] take care of you.

## 2014-09-19 NOTE — Progress Notes (Signed)
Internal Medicine Clinic Attending  Case discussed with Dr. Moding at the time of the visit.  We reviewed the resident's history and exam and pertinent patient test results.  I agree with the assessment, diagnosis, and plan of care documented in the resident's note. 

## 2014-09-28 ENCOUNTER — Ambulatory Visit (INDEPENDENT_AMBULATORY_CARE_PROVIDER_SITE_OTHER): Payer: Medicare Other | Admitting: Family Medicine

## 2014-09-28 ENCOUNTER — Encounter: Payer: Self-pay | Admitting: Family Medicine

## 2014-09-28 VITALS — BP 141/83 | HR 75 | Ht 65.0 in | Wt 225.0 lb

## 2014-09-28 DIAGNOSIS — M25512 Pain in left shoulder: Secondary | ICD-10-CM

## 2014-09-28 MED ORDER — METHYLPREDNISOLONE ACETATE 40 MG/ML IJ SUSP
40.0000 mg | Freq: Once | INTRAMUSCULAR | Status: AC
  Administered 2014-09-28: 40 mg via INTRA_ARTICULAR

## 2014-09-28 NOTE — Progress Notes (Signed)
   Subjective:    Patient ID: Austin Anderson, male    DOB: [birth year], 64 y.o.   MRN: [id number]  HPI Austin Anderson is a 64 year old right-hand-dominant male who presents with left shoulder pain. Onset was approximately 3-4 months ago without any known acute injury. He describes a deep soreness located throughout the shoulder. His pain will wake him up at night. Since prompt started has been slightly worsening. Symptoms are aggravated with overhead activities. He has tried exercise, all, meloxicam, and relative rest. Several years ago he received a corticosteroid injection, though he does not recall if this was subacromial or intra-articular. This significantly helped his symptoms. He denies any neck pain, fevers, chills, or weight loss. He denies any radicular symptoms and his pain does not extend below the elbow. He denies any weakness. He denies any catching or mechanical symptoms.  Past medical history, social history, medications, and allergies were reviewed and are up to date in the chart.  Review of Systems 7 point review of systems was performed and was otherwise negative unless noted in the history of present illness.     Objective:   Physical Exam BP 141/83 mmHg  Pulse 75  Ht 5\' 5"  (1.651 m)  Wt 225 lb (102.059 kg)  BMI 37.44 kg/m2 GEN: The patient is well-developed well-nourished male and in no acute distress.  He is awake alert and oriented x3. He is legally blind. SKIN: warm and well-perfused, no rash  EXTR: No lower extremity edema or calf tenderness Neuro: Strength 5/5 globally. Sensation intact throughout. No focal deficits. Vasc: +2 bilateral distal pulses. No edema.  MSK: Atrophy: [none]   Cervical ROM [Full]  Shoulder ROM: Right <---> Left   Forward flexion [180]<--->[170] ER at side [60]<--->[60] Abd ER [90]<--->[90] Abd IR [60]<--->[60] IR up back [T6]<--->[T12]  TTP:  AC joint:  [-]  Supraspinatus insertion:  [-]  Subsca/biceps:  [-]  Periscapular:  [-]  Trap:   [-]  Cuff: Impingement/cuff: Hawkins [+]   Jobes: [-]   FF strength:[5/5]   ER strength:[5/5]   Abdominal compression test:[-] Lag signs:[None]  Laxity/instabilty:  [-]  Yergason:  [-]           Speeds:  [-]    Crank:  [-]                     Active Compression: [-]  Neurovascular: [Normal sensation to light touch in median ulnar and radial nerve distribution with good strength in hand intrinsics grip and EPL, 2+ radial pulse.]  X-rays Left Shoulder 08/01/14: Previous films were reviewed of the left shoulder which show mild degenerative changes of the glenohumeral joint. Otherwise no fractures or acute bony changes.     Assessment & Plan:  Please see problem based assessment and plan in the problem list.

## 2014-09-28 NOTE — Assessment & Plan Note (Signed)
-  Glenohumeral injection today, patient tolerated well -Gentle range of motion and strengthening exercises as tolerated -Continue extra strength Tylenol and Motrin 67.5 mg -Follow-up in 2-3 months or sooner if needed.

## 2014-10-09 ENCOUNTER — Other Ambulatory Visit: Payer: Self-pay | Admitting: *Deleted

## 2014-10-09 MED ORDER — FLUTICASONE PROPIONATE 50 MCG/ACT NA SUSP: 1.0000 | Freq: Every day | NASAL | Status: DC | PRN

## 2014-10-12 ENCOUNTER — Other Ambulatory Visit: Payer: Self-pay | Admitting: Gastroenterology

## 2014-10-12 DIAGNOSIS — K319 Disease of stomach and duodenum, unspecified: Secondary | ICD-10-CM | POA: Diagnosis not present

## 2014-10-12 DIAGNOSIS — K30 Functional dyspepsia: Secondary | ICD-10-CM | POA: Diagnosis not present

## 2014-10-12 DIAGNOSIS — K2950 Unspecified chronic gastritis without bleeding: Secondary | ICD-10-CM | POA: Diagnosis not present

## 2014-10-12 DIAGNOSIS — R109 Unspecified abdominal pain: Secondary | ICD-10-CM | POA: Diagnosis not present

## 2014-10-12 DIAGNOSIS — K5730 Diverticulosis of large intestine without perforation or abscess without bleeding: Secondary | ICD-10-CM | POA: Diagnosis not present

## 2014-10-12 DIAGNOSIS — K2970 Gastritis, unspecified, without bleeding: Secondary | ICD-10-CM | POA: Diagnosis not present

## 2014-10-12 DIAGNOSIS — K259 Gastric ulcer, unspecified as acute or chronic, without hemorrhage or perforation: Secondary | ICD-10-CM | POA: Diagnosis not present

## 2014-10-22 ENCOUNTER — Ambulatory Visit (INDEPENDENT_AMBULATORY_CARE_PROVIDER_SITE_OTHER): Payer: Medicare Other | Admitting: Internal Medicine

## 2014-10-22 ENCOUNTER — Encounter: Payer: Self-pay | Admitting: Internal Medicine

## 2014-10-22 ENCOUNTER — Ambulatory Visit (HOSPITAL_COMMUNITY)
Admission: RE | Admit: 2014-10-22 | Discharge: 2014-10-22 | Disposition: A | Discharge status: Home / Self Care | Payer: Medicare Other | Source: Ambulatory Visit | Attending: Internal Medicine | Admitting: Internal Medicine

## 2014-10-22 VITALS — BP 140/88 | HR 74 | Temp 98.1°F | Ht 65.0 in | Wt 231.0 lb

## 2014-10-22 DIAGNOSIS — H540 Blindness, both eyes: Secondary | ICD-10-CM

## 2014-10-22 DIAGNOSIS — E119 Type 2 diabetes mellitus without complications: Secondary | ICD-10-CM | POA: Insufficient documentation

## 2014-10-22 DIAGNOSIS — G8929 Other chronic pain: Secondary | ICD-10-CM

## 2014-10-22 DIAGNOSIS — M79672 Pain in left foot: Secondary | ICD-10-CM

## 2014-10-22 DIAGNOSIS — B370 Candidal stomatitis: Secondary | ICD-10-CM | POA: Diagnosis not present

## 2014-10-22 DIAGNOSIS — I10 Essential (primary) hypertension: Secondary | ICD-10-CM

## 2014-10-22 DIAGNOSIS — E1122 Type 2 diabetes mellitus with diabetic chronic kidney disease: Secondary | ICD-10-CM

## 2014-10-22 DIAGNOSIS — Z0000 Encounter for general adult medical examination without abnormal findings: Secondary | ICD-10-CM

## 2014-10-22 DIAGNOSIS — M19072 Primary osteoarthritis, left ankle and foot: Secondary | ICD-10-CM | POA: Diagnosis not present

## 2014-10-22 LAB — POCT GLYCOSYLATED HEMOGLOBIN (HGB A1C): Hemoglobin A1C: 6.8

## 2014-10-22 MED ORDER — NYSTATIN 100000 UNIT/ML MT SUSP: 5.0000 mL | Freq: Four times a day (QID) | OROMUCOSAL | Status: DC

## 2014-10-22 NOTE — Assessment & Plan Note (Addendum)
Due for a1c soon so checked today Refused pneumococcal vaccine today but said would think about it--revisit on follow up visit Reports recently having a colonoscopy done? Try to get records for pcp to review

## 2014-10-22 NOTE — Assessment & Plan Note (Signed)
Chronic for several years per patient after stepping on some porcelain in the bathtub and feels like something has been stuck since then. Seen for this pain in the past. No imaging at that time. Currently, no obvious foreign body on plantar surface of foot on visual inspection or palpation. However, given that he distinctly remembers when injury took place, will do xray to rule out foreign body. If xray negative but pain still persistent, possibly neuropathy? He does have diabetes but is relatively well controlled. In the meantime, I have recommended getting gel cushion insoles from local pharmacy for more foot comfort.

## 2014-10-22 NOTE — Assessment & Plan Note (Signed)
Lab Results  Component Value Date   HGBA1C 6.8 10/22/2014   HGBA1C 6.9 08/01/2014   HGBA1C 6.1 01/12/2014    Assessment: Diabetes control:   well controlled Progress toward A1C goal:    improving Comments: down to 6.8 today  Plan: Medications:  on no medications at this time Instruction/counseling given: discussed diet Educational resources provided: brochure, handout Self management tools provided:   Other plans: work on weight loss and continue to follow diabetic diet.

## 2014-10-22 NOTE — Assessment & Plan Note (Addendum)
He was unaware of any coating on tongue but is blind. Says things do not taste very well in the mornings but he did not think much of it. He does have diabetes and uses inhalers.   -nystatin swish and swallow -will recommend to rinse mouth after use of inhalers -consider HIV testing on follow up visit

## 2014-10-22 NOTE — Progress Notes (Signed)
Subjective:   Patient ID: Austin Anderson male   DOB: 02-06-1951 64 y.o.   MRN: [id number]  HPI: [name] is a 64 y.o. male with PMH as listed below presenting to opc today for acute visit with complaints of left foot pain.  Of note, he was seen in opc for left foot pain in 08/2014 with no intervention at that time.   L foot pain--chronic, x3 years, intermittent, mid foot, plantar surface. Reports pain started after stepping on pieces of porcelain in his bathtub and knows something is stuck in foot.  Denies any radiation of pain, worse when walking at times, occasionally tender to palpation. He has not tried any specific remedy for foot pain, but has been on Mobic and Norco in the past with no relief. He denies any pins and needles sensation other than in that very spot. No decreased sensation in extremities.   Past Medical History  Diagnosis Date  . Prostate cancer     Prostatic adenocarcinoma, Gleason score 7 (3+4), s/p radical prostatectomy 12/04/10 by Dr. [name].   . Diabetes mellitus, type 2   . Hypertension   . Asthma     Reported by patient's last physician before coming to [facility].  No PFT's on file.   . Gout   . Diverticulosis    Current Outpatient Prescriptions  Medication Sig Dispense Refill  . acetaminophen (TYLENOL) 650 MG CR tablet Take 1 tablet (650 mg total) by mouth every 8 (eight) hours as needed for pain. 60 tablet 3  . albuterol (PROVENTIL HFA;VENTOLIN HFA) 108 (90 BASE) MCG/ACT inhaler Inhale 2 puffs into the lungs every 6 (six) hours as needed for wheezing. 1 Inhaler 11  . allopurinol (ZYLOPRIM) 300 MG tablet TAKE 1 TABLET BY MOUTH TWICE A DAY 180 tablet 3  . atenolol (TENORMIN) 25 MG tablet TAKE 1 TABLET BY MOUTH EVERY DAY 90 tablet 3  . atorvastatin (LIPITOR) 40 MG tablet Take 1 tablet (40 mg total) by mouth daily. 30 tablet 11  . fluticasone (FLONASE) 50 MCG/ACT nasal spray Place 1 spray into both nostrils daily as needed for allergies or rhinitis. 9.9 g 2  .  Fluticasone-Salmeterol (ADVAIR DISKUS) 250-50 MCG/DOSE AEPB Inhale 1 puff into the lungs 2 (two) times daily. 60 each 5  . HYDROcodone-acetaminophen (NORCO/VICODIN) 5-325 MG per tablet Take 1 tablet by mouth every 6 (six) hours as needed for moderate pain. 30 tablet 0  . losartan (COZAAR) 100 MG tablet TAKE 1 TABLET BY MOUTH ONCE A DAY 90 tablet 2  . meloxicam (MOBIC) 7.5 MG tablet Take 7.5 mg by mouth daily.  0  . pantoprazole (PROTONIX) 20 MG tablet Take 1 tablet (20 mg total) by mouth daily. 90 tablet 3  . pantoprazole (PROTONIX) 40 MG tablet Take 40 mg by mouth daily.  3  . traMADol (ULTRAM) 50 MG tablet Take 1 tablet (50 mg total) by mouth daily. 60 tablet 0   No current facility-administered medications for this visit.   Family History  Problem Relation Age of Onset  . Heart attack Father 42  . Heart disease Mother   . Diabetes Sister    History   Social History  . Marital Status: Single    Spouse Name: N/A    Number of Children: N/A  . Years of Education: N/A   Social History Main Topics  . Smoking status: Never Smoker   . Smokeless tobacco: Not on file  . Alcohol Use: No  . Drug Use: No  .  Sexual Activity: Not on file   Other Topics Concern  . Not on file   Social History Narrative   The patient lives alone in [place].  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  The patient is on medicaid/medicare, and receives a disability check.  The patient has a sister in [place].  The patient has a 10th grade education.  The patient was diagnosed with Juvenile Glaucoma at age 59, but it was advanced at that time.  The patient lost his vision at age 30, and now can only barely see light, but no colors or shapes.  The patient has an aide who comes to his house every week to help him sort his medications.   Review of Systems:  Constitutional:  Denies fever, chills  HEENT:  Blindness  Respiratory:  Denies SOB  Cardiovascular:  Denies chest  pain  Gastrointestinal:  Denies nausea, vomiting, abdominal pain. Diarrhea yesterday now resolved  Genitourinary:  Denies trouble urinating  Musculoskeletal:  L foot pain  Skin:  Dry skin  Neurological:  Denies headaches.    Objective:  Physical Exam: Filed Vitals:   10/22/14 0828  BP: 140/88  Pulse: 74  Temp: 98.1 F (36.7 C)  TempSrc: Oral  Height: 5\' 5"  (1.651 m)  Weight: 231 lb (104.781 kg)  SpO2: 99%   Vitals reviewed. General: sitting in chair, NAD HEENT: b/l blindness, wearing sunglasses, crusting on eyelids but able to open eyes. Thrush, somewhat scrapeable. malodorous breath.  Cardiac: RRR Pulm: slightly decreased breath sounds at bases b/l Abd: soft, obese, nontender, BS present Ext: moving all extremities, mild tenderness to deep palpation of left mid foot on plantar surface, localized tenderness, no radiation of pain. Very dry skin on feet Neuro: alert and oriented X3, sensation to light touch equal in bilateral upper and lower extremities, proprioception intact  Assessment & Plan:  Discussed with Dr. Dareen Piano

## 2014-10-22 NOTE — Assessment & Plan Note (Addendum)
BP Readings from Last 3 Encounters:  10/22/14 140/88  09/28/14 141/83  09/18/14 124/82   Lab Results  Component Value Date   NA 141 08/01/2014   K 4.0 08/01/2014   CREATININE 1.19 08/01/2014   Assessment: Blood pressure control:   slightly elevated Progress toward BP goal:   improved but slightly above goal Comments: did not take his BP medications today that could be contributing to elevation but also is having some foot pain Repeat BP was elevated but likely inaccurate as done right after lab visit, SBP 160s. Recheck on follow up visit advised Plan: Medications:  continue current medications atenolol and losartan Educational resources provided: brochure, handout, video Self management tools provided:   Other plans: if remains elevated on recheck on follow up visit, may need to adjust medications, consider thiazide?

## 2014-10-22 NOTE — Patient Instructions (Addendum)
Please get your xray done  Try to take your blood pressure on time and daily  Please try to get insoles from your local pharmacy for your shoes to see if that gives you any help with the pain.   Return to see Dr. Ethelene Hal in approximately 3 months  Consider getting the pneumococcal vaccine  Try to work on your diabetic diet  Please try the nystatin swish and swallow suspension for the next 7 days to try to get rid of thrush. Try to hold in mouth after swishing for as long as possible--several minutes then swallow  Remember to rinse your mouth after using inhalers  Please bring your medicines with you each time you come to clinic.  Medicines may include prescription medications, over-the-counter medications, herbal remedies, eye drops, vitamins, or other pills.   Progress Toward Treatment Goals:  Treatment Goal 09/04/2014  Hemoglobin A1C at goal  Blood pressure at goal  Prevent falls at goal    Self Care Goals & Plans:  Self Care Goal 10/22/2014  Manage my medications take my medicines as prescribed; bring my medications to every visit; refill my medications on time; follow the sick day instructions if I am sick  Monitor my health keep track of my weight  Eat healthy foods eat more vegetables; eat fruit for snacks and desserts; eat baked foods instead of fried foods; eat foods that are low in salt; drink diet soda or water instead of juice or soda  Be physically active find an activity I enjoy  Meeting treatment goals -    Home Blood Glucose Monitoring 09/04/2014  Check my blood sugar no home glucose monitoring  When to check my blood sugar N/A     Care Management & Community Referrals:  Referral 09/04/2014  Referrals made for care management support none needed  Referrals made to community resources none

## 2014-10-23 LAB — GLUCOSE, CAPILLARY: Glucose-Capillary: 128 mg/dL — ABNORMAL HIGH (ref 70–99)

## 2014-10-23 NOTE — Progress Notes (Signed)
INTERNAL MEDICINE TEACHING ATTENDING ADDENDUM - Wilburn Keir, MD: I reviewed and discussed at the time of visit with the resident Dr. Qureshi, the patient's medical history, physical examination, diagnosis and results of pertinent tests and treatment and I agree with the patient's care as documented.  

## 2014-10-26 ENCOUNTER — Other Ambulatory Visit: Payer: Self-pay | Admitting: *Deleted

## 2014-10-30 NOTE — Telephone Encounter (Signed)
Pt has been informed, he will call for appointment if needed for pain control.

## 2014-11-16 ENCOUNTER — Other Ambulatory Visit: Payer: Self-pay | Admitting: *Deleted

## 2014-11-16 MED ORDER — ALBUTEROL SULFATE HFA 108 (90 BASE) MCG/ACT IN AERS: 2.0000 | INHALATION_SPRAY | Freq: Four times a day (QID) | RESPIRATORY_TRACT | Status: DC | PRN

## 2014-11-16 MED ORDER — FLUTICASONE-SALMETEROL 250-50 MCG/DOSE IN AEPB: 1.0000 | INHALATION_SPRAY | Freq: Two times a day (BID) | RESPIRATORY_TRACT | Status: DC

## 2015-01-09 ENCOUNTER — Encounter: Payer: Self-pay | Admitting: *Deleted

## 2015-01-16 ENCOUNTER — Encounter: Payer: Self-pay | Admitting: Internal Medicine

## 2015-01-16 ENCOUNTER — Ambulatory Visit (INDEPENDENT_AMBULATORY_CARE_PROVIDER_SITE_OTHER): Payer: Medicare Other | Admitting: Internal Medicine

## 2015-01-16 VITALS — BP 153/94 | HR 66 | Temp 97.7°F | Ht 65.0 in | Wt 235.9 lb

## 2015-01-16 DIAGNOSIS — E119 Type 2 diabetes mellitus without complications: Secondary | ICD-10-CM | POA: Diagnosis not present

## 2015-01-16 DIAGNOSIS — M25512 Pain in left shoulder: Secondary | ICD-10-CM

## 2015-01-16 DIAGNOSIS — R198 Other specified symptoms and signs involving the digestive system and abdomen: Secondary | ICD-10-CM

## 2015-01-16 DIAGNOSIS — E1122 Type 2 diabetes mellitus with diabetic chronic kidney disease: Secondary | ICD-10-CM

## 2015-01-16 LAB — GLUCOSE, CAPILLARY: Glucose-Capillary: 115 mg/dL — ABNORMAL HIGH (ref 70–99)

## 2015-01-16 LAB — POCT GLYCOSYLATED HEMOGLOBIN (HGB A1C): Hemoglobin A1C: 7.2

## 2015-01-16 MED ORDER — POLYETHYLENE GLYCOL 3350 17 G PO PACK: 17.0000 g | PACK | Freq: Every day | ORAL | Status: DC

## 2015-01-16 MED ORDER — HYDROCODONE-ACETAMINOPHEN 5-325 MG PO TABS: 1.0000 | ORAL_TABLET | Freq: Four times a day (QID) | ORAL | Status: DC | PRN

## 2015-01-16 NOTE — Assessment & Plan Note (Signed)
Patient still complains of these occasional sporadic abdominal fullness symptoms that happen in intervals between 2-3 months at a time. Is not associate with any change in bowel habits or inability to pass flatus. The patient has not tried anything over-the-counter and continues with his Protonix daily. Recent EGD and colonoscopy reports were reviewed with the patient. It was only significant for some diverticula and gastric erythema unknown pathology reports records were obtained from [facility]. Patient has had stable weight. Wt Readings from Last 3 Encounters:  01/16/15 235 lb 14.4 oz (107.004 kg)  10/22/14 231 lb (104.781 kg)  09/28/14 225 lb (102.059 kg)    -Would recommend preventing constipation prescribed MiraLAX daily -Follow-up if symptoms fail to improve or other red flag symptoms develop

## 2015-01-16 NOTE — Assessment & Plan Note (Signed)
Patient has known degenerative changes of the glenohumeral joint. Was recently evaluated by sports medicine and received a corticosteroid injection. The patient states that he had good relief for about 2 months and is starting to have return of his symptoms. He has tried many medications before including Tylenol and Motrin along with Modic with minimal improvement. He does feel that at one point [place] did help relieve most of his pain. He is not interested in receiving any more corticosteroid injections at this time and therefore would like to start back on some medications. -Norco prescription -Recommend follow-up with PCP to establish pain contract if this is going to be a long-term maintenance medication -Given ongoing abdominal symptoms patient was advised to take senna S or MiraLAX daily to prevent constipation given his known diverticular disease last seen on his most recent colonoscopy

## 2015-01-16 NOTE — Progress Notes (Signed)
Subjective:   Patient ID: Austin Anderson male   DOB: 1951/06/01 64 y.o.   MRN: [id number]  HPI: [name] is a 64 y.o. man with a past medical history as listed below who presents for an acute visit regarding his chronic ongoing left shoulder pain and some intermittent episodes of abdominal fullness.  In regards to his chronic shoulder pain the patient recently received cord the steroid injections about 3 months ago and did have adequate response. The patient is not interested in continuing the shots and would like to still try medications at this time. He is not interested in surgery. Upon chart review the patient has had multiple medication managements including Tylenol, Motrin, ibuprofen, and Norco. The patient states that the only significant response you received was from Austin Anderson.  In terms of his abdominal fullness the patient states that it's intermittent happening at intervals anywhere between one to 2 months. He does not associate with any nausea vomiting or diarrhea. The patient has not had any significant weight loss or weight gain. The patient has not had changes in bowel habits. Given the patient's blindness he is unaware of any hematochezia or gross blood. He continues on his Protonix daily.   Past Medical History  Diagnosis Date  . Prostate cancer     Prostatic adenocarcinoma, Gleason score 7 (3+4), s/p radical prostatectomy 12/04/10 by Dr. Alinda Anderson.   . Diabetes mellitus, type 2   . Hypertension   . Asthma     Reported by patient's last physician before coming to [facility].  No PFT's on file.   . Gout   . Diverticulosis    Current Outpatient Prescriptions  Medication Sig Dispense Refill  . acetaminophen (TYLENOL) 650 MG CR tablet Take 1 tablet (650 mg total) by mouth every 8 (eight) hours as needed for pain. (Patient not taking: Reported on 10/22/2014) 60 tablet 3  . albuterol (PROVENTIL HFA;VENTOLIN HFA) 108 (90 BASE) MCG/ACT inhaler Inhale 2 puffs into the lungs every 6 (six)  hours as needed for wheezing. 3 Inhaler 3  . allopurinol (ZYLOPRIM) 300 MG tablet TAKE 1 TABLET BY MOUTH TWICE A DAY 180 tablet 3  . atenolol (TENORMIN) 25 MG tablet TAKE 1 TABLET BY MOUTH EVERY DAY 90 tablet 3  . atorvastatin (LIPITOR) 40 MG tablet Take 1 tablet (40 mg total) by mouth daily. 30 tablet 11  . fluticasone (FLONASE) 50 MCG/ACT nasal spray Place 1 spray into both nostrils daily as needed for allergies or rhinitis. 9.9 g 2  . Fluticasone-Salmeterol (ADVAIR DISKUS) 250-50 MCG/DOSE AEPB Inhale 1 puff into the lungs 2 (two) times daily. 180 each 0  . HYDROcodone-acetaminophen (NORCO/VICODIN) 5-325 MG per tablet Take 1 tablet by mouth every 6 (six) hours as needed for moderate pain. 30 tablet 0  . losartan (COZAAR) 100 MG tablet TAKE 1 TABLET BY MOUTH ONCE A DAY 90 tablet 2  . nystatin (MYCOSTATIN) 100000 UNIT/ML suspension Take 5 mLs (500,000 Units total) by mouth 4 (four) times daily. swish in mouth and retain for several minutes if possible before swallowing 60 mL 0  . pantoprazole (PROTONIX) 20 MG tablet Take 1 tablet (20 mg total) by mouth daily. 90 tablet 3  . polyethylene glycol (MIRALAX) packet Take 17 g by mouth daily. 100 each 0   No current facility-administered medications for this visit.   Family History  Problem Relation Age of Onset  . Heart attack Father 58  . Heart disease Mother   . Diabetes Sister  History   Social History  . Marital Status: Single    Spouse Name: N/A  . Number of Children: N/A  . Years of Education: N/A   Social History Main Topics  . Smoking status: Never Smoker   . Smokeless tobacco: Not on file  . Alcohol Use: No  . Drug Use: No  . Sexual Activity: Not on file   Other Topics Concern  . None   Social History Narrative   The patient lives alone in [place].  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  The patient is on medicaid/medicare, and receives a disability check.  The patient has a  sister in [place].  The patient has a 10th grade education.  The patient was diagnosed with Juvenile Glaucoma at age 34, but it was advanced at that time.  The patient lost his vision at age 7, and now can only barely see light, but no colors or shapes.  The patient has an aide who comes to his house every week to help him sort his medications.   Review of Systems: Pertinent items are noted in HPI. Objective:  Physical Exam: Filed Vitals:   01/16/15 1321  BP: 153/94  Pulse: 66  Temp: 97.7 F (36.5 C)  TempSrc: Oral  Height: 5\' 5"  (1.651 m)  Weight: 235 lb 14.4 oz (107.004 kg)  SpO2: 100%   General: sitting in wheelchair, NAD Cardiac: RRR, no rubs, murmurs or gallops Pulm: clear to auscultation bilaterally, moving normal volumes of air Abd: soft, nontender, nondistended, BS present Ext: warm and well perfused, no pedal edema MSK: Slightly limited forward flexion of left shoulder impede by pain, 5 out of 5 upper extremity strength, 2+ DTRs bilaterally, no muscle atrophy, no tenderness to palpation of supraspinatus/biceps/trapezius muscles  Assessment & Plan:  Please see problem oriented charting  Pt discussed with Dr. Dareen Piano

## 2015-01-18 NOTE — Assessment & Plan Note (Signed)
Lab Results  Component Value Date   HGBA1C 7.2 01/16/2015   HGBA1C 6.8 10/22/2014   HGBA1C 6.9 08/01/2014     Assessment: Diabetes control:   Progress toward A1C goal:    Comments:   Plan: Medications:  Pt is still not interested in medications at this time would like to try to further control on diet and lifestyle changes feels acute shoulder pain has hindered some exercise Home glucose monitoring: Frequency:   Timing:   Instruction/counseling given: reminded to bring blood glucose meter & log to each visit, discussed the need for weight loss and discussed diet Educational resources provided: brochure, handout Self management tools provided:   Other plans: f/u in 3 months

## 2015-01-18 NOTE — Progress Notes (Signed)
INTERNAL MEDICINE TEACHING ATTENDING ADDENDUM - Lucetta Baehr, MD: I reviewed and discussed at the time of visit with the resident Dr. Sadek, the patient's medical history, physical examination, diagnosis and results of pertinent tests and treatment and I agree with the patient's care as documented.  

## 2015-01-23 ENCOUNTER — Encounter: Payer: Medicare Other | Admitting: Internal Medicine

## 2015-02-13 ENCOUNTER — Other Ambulatory Visit: Payer: Self-pay | Admitting: *Deleted

## 2015-02-13 MED ORDER — ATENOLOL 25 MG PO TABS: 25.0000 mg | ORAL_TABLET | Freq: Every day | ORAL | Status: DC

## 2015-02-13 MED ORDER — ALLOPURINOL 300 MG PO TABS: 300.0000 mg | ORAL_TABLET | Freq: Two times a day (BID) | ORAL | Status: DC

## 2015-02-15 ENCOUNTER — Other Ambulatory Visit: Payer: Self-pay | Admitting: Internal Medicine

## 2015-02-15 DIAGNOSIS — C61 Malignant neoplasm of prostate: Secondary | ICD-10-CM | POA: Diagnosis not present

## 2015-02-15 DIAGNOSIS — N5201 Erectile dysfunction due to arterial insufficiency: Secondary | ICD-10-CM | POA: Diagnosis not present

## 2015-03-29 ENCOUNTER — Encounter: Payer: Self-pay | Admitting: Family Medicine

## 2015-03-29 ENCOUNTER — Ambulatory Visit (INDEPENDENT_AMBULATORY_CARE_PROVIDER_SITE_OTHER): Payer: Medicare Other | Admitting: Family Medicine

## 2015-03-29 VITALS — BP 150/90 | HR 64 | Ht 66.0 in | Wt 230.0 lb

## 2015-03-29 DIAGNOSIS — M25512 Pain in left shoulder: Secondary | ICD-10-CM | POA: Diagnosis not present

## 2015-03-29 MED ORDER — METHYLPREDNISOLONE ACETATE 40 MG/ML IJ SUSP
40.0000 mg | Freq: Once | INTRAMUSCULAR | Status: AC
  Administered 2015-03-29: 40 mg via INTRA_ARTICULAR

## 2015-04-02 NOTE — Progress Notes (Signed)
   Subjective:    Patient ID: Austin Anderson, male    DOB: Jan 18, 1951, 64 y.o.   MRN: [id number]  HPI Recurrence of LEFT shoulder pain. Has had this before and has had significant relief (> 6 m). Not having stiffness now, just some return of the pain, keeping him awake at nigt as he cannot lie on that side comfortably. Right hand dominant. No new injury taht he is aware of PERTINENT  PMH / PSH: I have reviewed the patient's medications, allergies, past medical and surgical history. Pertinent findings that relate to today's visit / issues include: Prostate cancer Blindness obesity  Review of Systems No numbness or tinling in his left hand, noleft ar or hand weakness    Objective:   Physical Exam  WD WN overweight NAD Walks with white cane SHOULDER LEFT: FROM including forward flexion and abduction. and has normal strength in all  planes or RC.He has pain with lift off test and supraspinatus testing but no weakness. Biceps tendon nontender to palpation.  NEURO: intact sensation B symmetrical hands VASC radial pulses 2+ B=  INJECTION: Patient was given informed consent, signed copy in the chart. Appropriate time out was taken. Area prepped and draped in usual sterile fashion. 1 cc of methylprednisolone 40 mg/ml plus  4 cc of 1% lidocaine without epinephrine was injected into the LEFT SUBACROMIAL BURSA using a(n) posterior approach. The patient tolerated the procedure well. There were no complications. Post procedure instructions were given.       Assessment & Plan:  Left shoulder pain--does not seem to be recurrence of adhesive capsulitis. We decided to just do a subacromial bursa injection and see if taht improved his symptoms. HEP. If not improving, rtc and we will pursue GHJ injection therapy.

## 2015-05-08 ENCOUNTER — Ambulatory Visit (INDEPENDENT_AMBULATORY_CARE_PROVIDER_SITE_OTHER): Payer: Medicare Other | Admitting: Internal Medicine

## 2015-05-08 VITALS — BP 146/81 | HR 59 | Temp 97.6°F | Ht 65.0 in | Wt 234.2 lb

## 2015-05-08 DIAGNOSIS — I10 Essential (primary) hypertension: Secondary | ICD-10-CM

## 2015-05-08 DIAGNOSIS — N182 Chronic kidney disease, stage 2 (mild): Secondary | ICD-10-CM | POA: Diagnosis not present

## 2015-05-08 DIAGNOSIS — E1122 Type 2 diabetes mellitus with diabetic chronic kidney disease: Secondary | ICD-10-CM

## 2015-05-08 DIAGNOSIS — M25512 Pain in left shoulder: Secondary | ICD-10-CM | POA: Diagnosis not present

## 2015-05-08 DIAGNOSIS — Z6839 Body mass index (BMI) 39.0-39.9, adult: Secondary | ICD-10-CM

## 2015-05-08 DIAGNOSIS — Z79899 Other long term (current) drug therapy: Secondary | ICD-10-CM

## 2015-05-08 DIAGNOSIS — E119 Type 2 diabetes mellitus without complications: Secondary | ICD-10-CM | POA: Diagnosis not present

## 2015-05-08 DIAGNOSIS — R1013 Epigastric pain: Secondary | ICD-10-CM

## 2015-05-08 DIAGNOSIS — I129 Hypertensive chronic kidney disease with stage 1 through stage 4 chronic kidney disease, or unspecified chronic kidney disease: Secondary | ICD-10-CM | POA: Diagnosis not present

## 2015-05-08 LAB — GLUCOSE, CAPILLARY: GLUCOSE-CAPILLARY: 123 mg/dL — AB (ref 65–99)

## 2015-05-08 LAB — POCT GLYCOSYLATED HEMOGLOBIN (HGB A1C): Hemoglobin A1C: 7.2

## 2015-05-08 MED ORDER — METFORMIN HCL ER 500 MG PO TB24: 500.0000 mg | ORAL_TABLET | Freq: Every day | ORAL | Status: DC

## 2015-05-08 MED ORDER — HYDROCODONE-ACETAMINOPHEN 5-325 MG PO TABS: 1.0000 | ORAL_TABLET | Freq: Four times a day (QID) | ORAL | Status: DC | PRN

## 2015-05-08 MED ORDER — HYDROCHLOROTHIAZIDE 12.5 MG PO CAPS: 12.5000 mg | ORAL_CAPSULE | Freq: Every day | ORAL | Status: DC

## 2015-05-08 MED ORDER — ATORVASTATIN CALCIUM 40 MG PO TABS: 40.0000 mg | ORAL_TABLET | Freq: Every day | ORAL | Status: DC

## 2015-05-08 NOTE — Assessment & Plan Note (Signed)
BP Readings from Last 3 Encounters:  05/08/15 146/81  03/29/15 150/90  01/16/15 153/94    Lab Results  Component Value Date   NA 141 08/01/2014   K 4.0 08/01/2014   CREATININE 1.19 08/01/2014    Assessment: Blood pressure control:  fair Progress toward BP goal:   improved Comments: Patient is currently taking Atenolol 25 mg daily and Losartan 100 mg daily. Reports compliance with his medications.   Plan: Medications:  Will add HCTZ 12.5 mg daily  Educational resources provided:   Self management tools provided:   Other plans: RTC in 2-3 months for today. Check CMP today.

## 2015-05-08 NOTE — Assessment & Plan Note (Signed)
Continues to complain of left shoulder pain. As seen by sports medicine on 7/15 and received a subacromial bursa injection at that time. Noted that if symptoms did not improve they would try a GHJ injection. Told him to schedule an appointment to be seen by them again. Gave norco prescription for pain until he can be seen again.

## 2015-05-08 NOTE — Assessment & Plan Note (Addendum)
Lab Results  Component Value Date   HGBA1C 7.2 05/08/2015   HGBA1C 7.2 01/16/2015   HGBA1C 6.8 10/22/2014     Assessment: Diabetes control:  good Progress toward A1C goal:   no change Comments: Patient is not on any medications for diabetes. He is obese with a BMI of 39, weight is stable.   Plan: Medications:  continue current medications, Start Metformin today Home glucose monitoring: Frequency:   Timing:   Instruction/counseling given: reminded to bring medications to each visit and discussed the need for weight loss Educational resources provided:   Self management tools provided:   Other plans: Metformin ER 500 mg daily x 2 weeks, then 1000 mg daily if tolerated. RTC in 2-3 months for follow up. Discussed diet and weight loss today. Check Microalbumin at next visit.  ACC/AHA CV Risk score 25.5%. Will check LFTs today and start him on a statin. Recheck LFTs at next visit.

## 2015-05-08 NOTE — Assessment & Plan Note (Signed)
Symptoms well controlled on Protonix. Will continue current meds. Had EGD done in January 2016 with a couple areas of gastric erosion noted. Biospy results show mild chronic inactive gastritis, stain negative for H. Pylori.

## 2015-05-08 NOTE — Progress Notes (Signed)
Patient ID: AJA BOLANDER, male   DOB: Feb 06, 1951, 64 y.o.   MRN: [id number]   Subjective:   Patient ID: AKEEL REFFNER male   DOB: Aug 10, 1951 64 y.o.   MRN: [id number]  HPI: Mr.Verlin E Goyer is a 64 y.o. male with a PMH of DM2, HTN, Asthma, gout, prostate cancer here today for routine follow up for his chronic medical conditions.   For details of today's visit please refer to problem based charting.   Past Medical History  Diagnosis Date  . Prostate cancer     Prostatic adenocarcinoma, Gleason score 7 (3+4), s/p radical prostatectomy 12/04/10 by Dr. [name].   . Diabetes mellitus, type 2   . Hypertension   . Asthma     Reported by patient's last physician before coming to [facility].  No PFT's on file.   . Gout   . Diverticulosis    Current Outpatient Prescriptions  Medication Sig Dispense Refill  . ADVAIR DISKUS 250-50 MCG/DOSE AEPB INHALE 1 PUFF INTO THE LUNGS 2 TIMES DAILY. 180 each 0  . albuterol (PROVENTIL HFA;VENTOLIN HFA) 108 (90 BASE) MCG/ACT inhaler Inhale 2 puffs into the lungs every 6 (six) hours as needed for wheezing. 3 Inhaler 3  . allopurinol (ZYLOPRIM) 300 MG tablet Take 1 tablet (300 mg total) by mouth 2 (two) times daily. 180 tablet 0  . atenolol (TENORMIN) 25 MG tablet Take 1 tablet (25 mg total) by mouth daily. 90 tablet 0  . atorvastatin (LIPITOR) 40 MG tablet Take 1 tablet (40 mg total) by mouth daily. 30 tablet 11  . atorvastatin (LIPITOR) 40 MG tablet Take 1 tablet (40 mg total) by mouth daily. 30 tablet 2  . fluticasone (FLONASE) 50 MCG/ACT nasal spray Place 1 spray into both nostrils daily as needed for allergies or rhinitis. 9.9 g 2  . hydrochlorothiazide (MICROZIDE) 12.5 MG capsule Take 1 capsule (12.5 mg total) by mouth daily. 30 capsule 2  . HYDROcodone-acetaminophen (NORCO/VICODIN) 5-325 MG per tablet Take 1 tablet by mouth every 6 (six) hours as needed for moderate pain. 30 tablet 0  . losartan (COZAAR) 100 MG tablet TAKE 1 TABLET BY MOUTH ONCE A DAY 90 tablet 2  .  metFORMIN (GLUCOPHAGE-XR) 500 MG 24 hr tablet Take 1 tablet (500 mg total) by mouth daily with breakfast. After two weeks, if tolerating 1 pill, take 2 pills daily with breakfast. 60 tablet 2  . nystatin (MYCOSTATIN) 100000 UNIT/ML suspension Take 5 mLs (500,000 Units total) by mouth 4 (four) times daily. swish in mouth and retain for several minutes if possible before swallowing 60 mL 0  . pantoprazole (PROTONIX) 20 MG tablet Take 1 tablet (20 mg total) by mouth daily. 90 tablet 3  . polyethylene glycol (MIRALAX) packet Take 17 g by mouth daily. 100 each 0   No current facility-administered medications for this visit.   Family History  Problem Relation Age of Onset  . Heart attack Father 57  . Heart disease Mother   . Diabetes Sister    Social History   Social History  . Marital Status: Single    Spouse Name: N/A  . Number of Children: N/A  . Years of Education: N/A   Social History Main Topics  . Smoking status: Never Smoker   . Smokeless tobacco: Not on file  . Alcohol Use: No  . Drug Use: No  . Sexual Activity: Not on file   Other Topics Concern  . Not on file   Social History Narrative  The patient lives alone in [place].  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  The patient is on medicaid/medicare, and receives a disability check.  The patient has a sister in [place].  The patient has a 10th grade education.  The patient was diagnosed with Juvenile Glaucoma at age 43, but it was advanced at that time.  The patient lost his vision at age 54, and now can only barely see light, but no colors or shapes.  The patient has an aide who comes to his house every week to help him sort his medications.   Review of Systems: Review of Systems  Constitutional: Negative for fever and chills.  Respiratory: Negative for shortness of breath.   Cardiovascular: Negative for chest pain and palpitations.  Gastrointestinal: Negative for nausea,  vomiting, abdominal pain, diarrhea and constipation.  Genitourinary: Negative for dysuria.  Skin: Negative for rash.  Neurological: Negative for headaches.   Objective:  Physical Exam: Filed Vitals:   05/08/15 1025  BP: 146/81  Pulse: 59  Temp: 97.6 F (36.4 C)  TempSrc: Oral  Height: 5\' 5"  (1.651 m)  Weight: 234 lb 3.2 oz (106.232 kg)  SpO2: 100%   General: siting in chair, NAD HEENT: b/l blindness, wearing sunglasses, crusting on eyelids but able to open eyes. Cardiac: RRR, no m/r/g Pulm: slightly decreased breath sounds at bases b/l Abd: soft, obese, nontender, BS present Ext: moving all extremities, mild tenderness to palpation of left shoulder.  Neuro: alert and oriented X3, sensation to light touch equal in bilateral upper and lower extremities, proprioception intact  Assessment & Plan:   Case discussed with Dr. Dareen Piano. Please refer to Problem based carting for further details of today's visit.

## 2015-05-08 NOTE — Assessment & Plan Note (Signed)
No labs since 11/15. Will recheck Cr today.

## 2015-05-08 NOTE — Patient Instructions (Addendum)
General Instructions: Thank you for coming in today. It was a pleasure to meet you.  - I have given you a new prescription for Metformin. Take 1 pill in the morning with breakfast for 2 weeks. If you able to tolerate that (it can cause some stomach upset and diarrhea) then start taking 2 pills in the morning with breakfast. - I have also given you a new BP medication called Hydrochlorothiazide (HCTZ). Take one pill daily. - I have also started you on Lipitor today. Take this once a day.  - We are getting some lab work done today, if there are any problems I will call you. - We have set you up an appointment with Sports medicine. The appointment is 8/29 at 2:00 PM. [id number] with Dr. Nori Riis. Their phone number is [phone]. - Follow up with me in 2-3 months for a recheck.

## 2015-05-09 LAB — LIPID PANEL
CHOLESTEROL TOTAL: 111 mg/dL (ref 100–199)
Chol/HDL Ratio: 2.1 ratio units (ref 0.0–5.0)
HDL: 52 mg/dL (ref >39)
LDL Calculated: 51 mg/dL (ref 0–99)
Triglycerides: 38 mg/dL (ref 0–149)
VLDL CHOLESTEROL CAL: 8 mg/dL (ref 5–40)

## 2015-05-09 LAB — COMPREHENSIVE METABOLIC PANEL
ALBUMIN: 4 g/dL (ref 3.6–4.8)
ALK PHOS: 62 IU/L (ref 39–117)
ALT: 25 IU/L (ref 0–44)
AST: 24 IU/L (ref 0–40)
Albumin/Globulin Ratio: 1.4 (ref 1.1–2.5)
BUN / CREAT RATIO: 7 — AB (ref 10–22)
BUN: 8 mg/dL (ref 8–27)
Bilirubin Total: 0.5 mg/dL (ref 0.0–1.2)
CO2: 24 mmol/L (ref 18–29)
CREATININE: 1.09 mg/dL (ref 0.76–1.27)
Calcium: 9.4 mg/dL (ref 8.6–10.2)
Chloride: 102 mmol/L (ref 97–108)
GFR calc non Af Amer: 72 mL/min/{1.73_m2} (ref >59)
GFR, EST AFRICAN AMERICAN: 84 mL/min/{1.73_m2} (ref >59)
GLOBULIN, TOTAL: 2.9 g/dL (ref 1.5–4.5)
GLUCOSE: 120 mg/dL — AB (ref 65–99)
Potassium: 4.2 mmol/L (ref 3.5–5.2)
SODIUM: 140 mmol/L (ref 134–144)
TOTAL PROTEIN: 6.9 g/dL (ref 6.0–8.5)

## 2015-05-13 ENCOUNTER — Ambulatory Visit: Payer: Medicare Other | Admitting: Family Medicine

## 2015-05-13 NOTE — Progress Notes (Signed)
Internal Medicine Clinic Attending  I saw and evaluated the patient.  I personally confirmed the key portions of the history and exam documented by Dr. Boswell and I reviewed pertinent patient test results.  The assessment, diagnosis, and plan were formulated together and I agree with the documentation in the resident's note. 

## 2015-05-16 ENCOUNTER — Ambulatory Visit (INDEPENDENT_AMBULATORY_CARE_PROVIDER_SITE_OTHER): Payer: Medicare Other | Admitting: Family Medicine

## 2015-05-16 ENCOUNTER — Encounter: Payer: Self-pay | Admitting: Family Medicine

## 2015-05-16 VITALS — BP 125/80

## 2015-05-16 DIAGNOSIS — M25512 Pain in left shoulder: Secondary | ICD-10-CM

## 2015-05-16 NOTE — Progress Notes (Signed)
Patient ID: Austin Anderson, male   DOB: 12-09-1950, 64 y.o.   MRN: [id number]  CC: L shoulder pain  HPI: 64 year old R hand dominant man with hx of multiple injections for shoulder pain and adhesive capsulitis presenting for follow up after subacromial injection 5 weeks ago..  Pt states that his last injection only gave relief for 2 weeks.  States that after that he has returned to the same pain; pain in the back of shoulder joint worse with laying down and stiff upon waking Denies numbness/tingling/weakness in that arm No use of ice, exercises, or stretches following last injection States he had a previous injection about a year ago that gave him relief for 2-3 months.   BP 125/80 mmHg General: resting comfortably, NAD, blind and ambulating with white cane Resp: normal WOB MSK Shoulder:  Inspection: no deformity, swelling, erythema on either shoulder Palpation: pain in posterior L shoulder in area above scapular spine PROM: reduced PROM in left shoulder with abduction, only able to achieve about 100* before resistance and stop. PROM in other planes normal.  AROM: pt with reduced abduction in relation to R shoulder (150* to 160* respectively), other AROM normal.  Strength: 5/5 strength in all joints of upper extremity Sensation: subjectively equal on both arms Empty can: pain and very mild weakness in posterior L shoulder  Subscapularis: pain in posterior L shoulder Infraspinatus/Teres: pain in posterior L shoulder O Brien's: pain in posterior L shoulder   Assessment:  64 year old R hand dominant man with hx of multiple injections for shoulder pain and adhesive capsulitis presenting for follow up after subacromial injection 5 weeks ago. Pt states that he saw no real benefit from injection.  Given this and his pan positive exam, we are not sure exactly what is going on inside the shoulder. Given the lack of benefit from previous injections, we will hold off on further injection today.    PLAN:  We will schedule pt to see orthopedist for evaluation in the next few weeks  Waynard Reeds, MD Pt seen and evaluated with Dr. Nori Riis

## 2015-05-16 NOTE — Patient Instructions (Signed)
[address] Le[address] Tuesday September 20th at [address], [place], [place] [address] Phone: [phone]

## 2015-05-17 ENCOUNTER — Other Ambulatory Visit: Payer: Self-pay | Admitting: Internal Medicine

## 2015-05-18 NOTE — Progress Notes (Signed)
Patient ID: Austin Anderson, male   DOB: 27-Oct-1950, 64 y.o.   MRN: [id number] [place] Attending Note: I have seen and examined this patient. I have discussed this patient with the resident and reviewed the assessment and plan as documented above. I agree with the resident's findings and plan.  I is not clear to me why he is having shoulder pain. He has some mild pain with ABduction above 90 degrees but impigement signs are equivocal. The onky thing I can think of that would cause this type of waxing and waning, chronic bu mild, t symptoms would be some joint capsule adhesions or hypertrophy/ tightness, yet his PROM is full and painless except for ABduction.  Since we do no have a clear diagnosis and the subacromial bursa injection did not help for more han 10 days, I will send him to orthopedics for consultation.

## 2015-05-19 ENCOUNTER — Other Ambulatory Visit: Payer: Self-pay | Admitting: Internal Medicine

## 2015-05-31 ENCOUNTER — Other Ambulatory Visit: Payer: Self-pay | Admitting: Internal Medicine

## 2015-06-04 DIAGNOSIS — M7542 Impingement syndrome of left shoulder: Secondary | ICD-10-CM | POA: Diagnosis not present

## 2015-06-04 DIAGNOSIS — M19012 Primary osteoarthritis, left shoulder: Secondary | ICD-10-CM | POA: Diagnosis not present

## 2015-08-23 ENCOUNTER — Other Ambulatory Visit: Payer: Self-pay | Admitting: Internal Medicine

## 2015-08-28 ENCOUNTER — Ambulatory Visit (INDEPENDENT_AMBULATORY_CARE_PROVIDER_SITE_OTHER): Payer: Medicare Other | Admitting: Internal Medicine

## 2015-08-28 VITALS — BP 117/73 | HR 69 | Temp 97.7°F | Ht 65.0 in | Wt 229.9 lb

## 2015-08-28 DIAGNOSIS — M109 Gout, unspecified: Secondary | ICD-10-CM

## 2015-08-28 DIAGNOSIS — I10 Essential (primary) hypertension: Secondary | ICD-10-CM | POA: Diagnosis not present

## 2015-08-28 DIAGNOSIS — M25512 Pain in left shoulder: Secondary | ICD-10-CM

## 2015-08-28 DIAGNOSIS — Z23 Encounter for immunization: Secondary | ICD-10-CM

## 2015-08-28 DIAGNOSIS — Z0000 Encounter for general adult medical examination without abnormal findings: Secondary | ICD-10-CM | POA: Diagnosis not present

## 2015-08-28 DIAGNOSIS — N182 Chronic kidney disease, stage 2 (mild): Secondary | ICD-10-CM | POA: Diagnosis not present

## 2015-08-28 DIAGNOSIS — M10079 Idiopathic gout, unspecified ankle and foot: Secondary | ICD-10-CM | POA: Diagnosis not present

## 2015-08-28 DIAGNOSIS — J4520 Mild intermittent asthma, uncomplicated: Secondary | ICD-10-CM | POA: Diagnosis not present

## 2015-08-28 DIAGNOSIS — E119 Type 2 diabetes mellitus without complications: Secondary | ICD-10-CM | POA: Diagnosis not present

## 2015-08-28 LAB — GLUCOSE, CAPILLARY: GLUCOSE-CAPILLARY: 116 mg/dL — AB (ref 65–99)

## 2015-08-28 LAB — POCT GLYCOSYLATED HEMOGLOBIN (HGB A1C): HEMOGLOBIN A1C: 7.3

## 2015-08-28 MED ORDER — DICLOFENAC SODIUM 1 % TD GEL: 2.0000 g | Freq: Four times a day (QID) | TRANSDERMAL | Status: DC

## 2015-08-28 MED ORDER — METFORMIN HCL ER 500 MG PO TB24: 1000.0000 mg | ORAL_TABLET | Freq: Two times a day (BID) | ORAL | Status: DC

## 2015-08-28 NOTE — Assessment & Plan Note (Addendum)
Patient was seen by sports medicine and referred to ortho. Reports being told by ortho that his pain was 2/2 arthritis (no notes in EPIC) and given Rx for Mobic. Reports some symptom improvement in his symptoms but does still endorse shoulder pain when he exercises or uses his arm a lot.   Plan  Will d/c mobic due to his CKD II. Will try Voltaren gel.

## 2015-08-28 NOTE — Patient Instructions (Signed)
Thank you for coming in today.  -Stop taking the Advair and use the albuterol as needed -Your A1c today is 7.3. I am going to increase the dose of your Metformin to 1000 mg twice a day.  -Your blood pressure looks great today! Keep up with the medications.  -I would like to see you back in 3 months for follow up or sooner if needed.  -Please stop taking the Mobic. I am giving you a prescription for Voltaren gel to apply to your shoulder up to 4 times a day as needed for pain.

## 2015-08-28 NOTE — Assessment & Plan Note (Addendum)
  BP Readings from Last 3 Encounters:  08/28/15 117/73  05/16/15 125/80  05/08/15 146/81    Lab Results  Component Value Date   NA 140 05/08/2015   K 4.2 05/08/2015   CREATININE 1.09 05/08/2015    Assessment: Patient currently on Atenolol 25 mg daily and Losartan 100 mg daily and HCTZ 12.5 mg daily. Added HCTZ 8/24 visit. Reports compliance with his medications. Denies any lightheadedness, dizziness or headaches. BP 117/73 today in clinic. Denies any gout flares.   Plan: Will continue current medications today. Recheck BMP after starting HCTZ last visit. Will also check Uric acid level today given history of gout and initiating HCTZ therapy.   BMP unremarkable. Uric acid 4.1.

## 2015-08-28 NOTE — Assessment & Plan Note (Addendum)
Uric acid 4.1 today. Denies any recent flares.   Plan Continue allopurinol 300 mg bid

## 2015-08-28 NOTE — Assessment & Plan Note (Addendum)
  Lab Results  Component Value Date   HGBA1C 7.3 08/28/2015   HGBA1C 7.2 05/08/2015   HGBA1C 7.2 01/16/2015   Assessment: A1c was 7.2 on last visit. He was previously on no DM medications and was started on metfomrin ER 500 mg bid at that time. Reports compliance with his medications. Denies any GI upset. A1c 7.3 today despite initiation of therapy and 5 lb weight loss. He was also started on Lipitor at last visit for CV risk score of 25.5%. Reports compliance. Denies any side effects.   Plan: Will increase metformin to 1000 mg bid.  Micro albumin/cr wnl Lipid panel and LFTs wnl. Continue Lipitor.

## 2015-08-28 NOTE — Progress Notes (Signed)
Patient ID: Austin Anderson, male   DOB: 1951-06-30, 64 y.o.   MRN: [id number]   Subjective:   Patient ID: Austin Anderson male   DOB: 08/31/1951 64 y.o.   MRN: [id number]  HPI: [name] is a 64 y.o. male with a past medical history listed below here today for follow up of his HTN and DM.   For details of today's visit and the status of his chronic medical issues please refer to the assessment and plan.  Past Medical History  Diagnosis Date  . Prostate cancer     Prostatic adenocarcinoma, Gleason score 7 (3+4), s/p radical prostatectomy 12/04/10 by Dr. [name].   . Diabetes mellitus, type 2   . Hypertension   . Asthma     Reported by patient's last physician before coming to [facility].  No PFT's on file.   . Gout   . Diverticulosis    Current Outpatient Prescriptions  Medication Sig Dispense Refill  . albuterol (PROVENTIL HFA;VENTOLIN HFA) 108 (90 BASE) MCG/ACT inhaler Inhale 2 puffs into the lungs every 6 (six) hours as needed for wheezing. 3 Inhaler 3  . allopurinol (ZYLOPRIM) 300 MG tablet Take 1 tablet (300 mg total) by mouth 2 (two) times daily. 180 tablet 0  . atenolol (TENORMIN) 25 MG tablet TAKE 1 TABLET BY MOUTH EVERY DAY 90 tablet 0  . atorvastatin (LIPITOR) 40 MG tablet Take 1 tablet (40 mg total) by mouth daily. 30 tablet 2  . diclofenac sodium (VOLTAREN) 1 % GEL Apply 2 g topically 4 (four) times daily. 1 Tube 2  . fluticasone (FLONASE) 50 MCG/ACT nasal spray Place 1 spray into both nostrils daily as needed for allergies or rhinitis. 9.9 g 2  . hydrochlorothiazide (MICROZIDE) 12.5 MG capsule Take 1 capsule (12.5 mg total) by mouth daily. 30 capsule 2  . losartan (COZAAR) 100 MG tablet TAKE 1 TABLET BY MOUTH EVERY DAY 90 tablet 2  . metFORMIN (GLUCOPHAGE-XR) 500 MG 24 hr tablet Take 2 tablets (1,000 mg total) by mouth 2 (two) times daily. 60 tablet 2  . pantoprazole (PROTONIX) 20 MG tablet Take 1 tablet (20 mg total) by mouth daily. 90 tablet 3  . polyethylene glycol (MIRALAX /  GLYCOLAX) packet TAKE 17 GM BY MOUTH DAILY. 100 packet 0   No current facility-administered medications for this visit.   Family History  Problem Relation Age of Onset  . Heart attack Father 46  . Heart disease Mother   . Diabetes Sister    Social History   Social History  . Marital Status: Single    Spouse Name: N/A  . Number of Children: N/A  . Years of Education: N/A   Social History Main Topics  . Smoking status: Never Smoker   . Smokeless tobacco: Not on file  . Alcohol Use: No  . Drug Use: No  . Sexual Activity: Not on file   Other Topics Concern  . Not on file   Social History Narrative   The patient lives alone in [place].  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  The patient is on medicaid/medicare, and receives a disability check.  The patient has a sister in [place].  The patient has a 10th grade education.  The patient was diagnosed with Juvenile Glaucoma at age 78, but it was advanced at that time.  The patient lost his vision at age 57, and now can only barely see light, but no colors  or shapes.  The patient has an aide who comes to his house every week to help him sort his medications.   Review of Systems: Review of Systems  Constitutional: Negative for fever and chills.  Eyes: Negative for blurred vision and double vision.  Respiratory: Negative for shortness of breath.   Cardiovascular: Negative for chest pain.  Gastrointestinal: Negative for nausea, vomiting, abdominal pain, diarrhea and constipation.  Genitourinary: Negative for dysuria.  Neurological: Negative for dizziness and headaches.    Objective:  Physical Exam: Filed Vitals:   08/28/15 1327  BP: 117/73  Pulse: 69  Temp: 97.7 F (36.5 C)  TempSrc: Oral  Height: 5\' 5"  (1.651 m)  Weight: 229 lb 14.4 oz (104.282 kg)  SpO2: 100%   GENERAL- alert, co-operative, appears as stated age, not in any distress. HEENT- Atraumatic, normocephalic, PERRL,  EOMI, oral mucosa appears moist CARDIAC- RRR, no murmurs, rubs or gallops. RESP- Moving equal volumes of air, and clear to auscultation bilaterally, no wheezes or crackles. ABDOMEN- Soft, nontender, bowel sounds present. NEURO- No obvious Cr N abnormality. EXTREMITIES- pulse 2+, symmetric, no pedal edema. SKIN- Warm, dry, No rash or lesion. PSYCH- Normal mood and affect, appropriate thought content and speech.  Assessment & Plan:   Case discussed with Dr. Eppie Gibson. Please refer to Problem based charting for further details of today's visit.

## 2015-08-28 NOTE — Assessment & Plan Note (Deleted)
CKD II - Cr normal, 1.09. GFR 72 or 84.

## 2015-08-29 LAB — HEPATIC FUNCTION PANEL
ALT: 25 IU/L (ref 0–44)
AST: 24 IU/L (ref 0–40)
Albumin: 4.3 g/dL (ref 3.6–4.8)
Alkaline Phosphatase: 65 IU/L (ref 39–117)
Bilirubin Total: 0.6 mg/dL (ref 0.0–1.2)
Bilirubin, Direct: 0.18 mg/dL (ref 0.00–0.40)
TOTAL PROTEIN: 7 g/dL (ref 6.0–8.5)

## 2015-08-29 LAB — LIPID PANEL
CHOLESTEROL TOTAL: 132 mg/dL (ref 100–199)
Chol/HDL Ratio: 2.5 ratio units (ref 0.0–5.0)
HDL: 52 mg/dL (ref >39)
LDL CALC: 70 mg/dL (ref 0–99)
Triglycerides: 50 mg/dL (ref 0–149)
VLDL CHOLESTEROL CAL: 10 mg/dL (ref 5–40)

## 2015-08-29 LAB — URIC ACID: Uric Acid: 4.1 mg/dL (ref 3.7–8.6)

## 2015-08-29 LAB — BMP8+ANION GAP
Anion Gap: 15 mmol/L (ref 10.0–18.0)
BUN / CREAT RATIO: 13 (ref 10–22)
BUN: 14 mg/dL (ref 8–27)
CO2: 25 mmol/L (ref 18–29)
Calcium: 9.4 mg/dL (ref 8.6–10.2)
Chloride: 100 mmol/L (ref 96–106)
Creatinine, Ser: 1.12 mg/dL (ref 0.76–1.27)
GFR calc non Af Amer: 70 mL/min/{1.73_m2} (ref >59)
GFR, EST AFRICAN AMERICAN: 81 mL/min/{1.73_m2} (ref >59)
Glucose: 129 mg/dL — ABNORMAL HIGH (ref 65–99)
POTASSIUM: 4.4 mmol/L (ref 3.5–5.2)
Sodium: 140 mmol/L (ref 134–144)

## 2015-08-29 LAB — MICROALBUMIN / CREATININE URINE RATIO
Creatinine, Urine: 182.1 mg/dL
MICROALB/CREAT RATIO: 6 mg/g creat (ref 0.0–30.0)
Microalbumin, Urine: 10.9 ug/mL

## 2015-08-29 NOTE — Assessment & Plan Note (Signed)
Patient received flu shot today 

## 2015-08-29 NOTE — Assessment & Plan Note (Addendum)
Patient reports history of asthma but no PFTs on file. He currently has an advair inhaler but has difficulty using it and only uses it 1-2 times a week. Denies any shortness of breath, wheezing or coughing. Reports daily use of his albuterol inhaler but not for shortness of breath but to help clear mucus.   Plan Will d/c advair inhaler and continue albuterol prn. Patient appears to have mild intermittent asthma with minimal symptoms not requiring a steroid inhaler.

## 2015-09-02 ENCOUNTER — Other Ambulatory Visit: Payer: Self-pay | Admitting: Internal Medicine

## 2015-09-03 NOTE — Progress Notes (Signed)
I saw and evaluated the patient.  I personally confirmed the key portions of Dr. Shanna Cisco history and exam and reviewed pertinent patient test results.  The assessment, diagnosis, and plan were formulated together and I agree with the documentation in the resident's note.  As his uric acid is way below the target of < 6 on 300 mg allopurinol BID (even with the HCTZ), he may not require that much allopurinol to keep his level < 6.0.  We will consider switching him to allopurinol 200 mg BID at the follow-up visit and then subsequently recheck the uric acid level to make sure it remains < 6.0.  If it does we may be able to simplify his regimen to 400 mg daily.

## 2015-11-08 ENCOUNTER — Other Ambulatory Visit: Payer: Self-pay | Admitting: Internal Medicine

## 2015-12-28 ENCOUNTER — Other Ambulatory Visit: Payer: Self-pay | Admitting: Internal Medicine

## 2016-01-29 ENCOUNTER — Ambulatory Visit (INDEPENDENT_AMBULATORY_CARE_PROVIDER_SITE_OTHER): Payer: Medicare Other | Admitting: Internal Medicine

## 2016-01-29 VITALS — BP 128/70 | HR 74 | Temp 97.9°F | Ht 65.0 in | Wt 225.1 lb

## 2016-01-29 DIAGNOSIS — R1032 Left lower quadrant pain: Secondary | ICD-10-CM

## 2016-01-29 DIAGNOSIS — R198 Other specified symptoms and signs involving the digestive system and abdomen: Secondary | ICD-10-CM

## 2016-01-29 DIAGNOSIS — I10 Essential (primary) hypertension: Secondary | ICD-10-CM

## 2016-01-29 DIAGNOSIS — J45909 Unspecified asthma, uncomplicated: Secondary | ICD-10-CM

## 2016-01-29 DIAGNOSIS — M10079 Idiopathic gout, unspecified ankle and foot: Secondary | ICD-10-CM | POA: Diagnosis not present

## 2016-01-29 DIAGNOSIS — Z7984 Long term (current) use of oral hypoglycemic drugs: Secondary | ICD-10-CM | POA: Diagnosis not present

## 2016-01-29 DIAGNOSIS — E119 Type 2 diabetes mellitus without complications: Secondary | ICD-10-CM

## 2016-01-29 DIAGNOSIS — M1A9XX0 Chronic gout, unspecified, without tophus (tophi): Secondary | ICD-10-CM | POA: Diagnosis not present

## 2016-01-29 DIAGNOSIS — J4520 Mild intermittent asthma, uncomplicated: Secondary | ICD-10-CM

## 2016-01-29 DIAGNOSIS — M109 Gout, unspecified: Secondary | ICD-10-CM

## 2016-01-29 LAB — POCT GLYCOSYLATED HEMOGLOBIN (HGB A1C): HEMOGLOBIN A1C: 6.9

## 2016-01-29 LAB — GLUCOSE, CAPILLARY: Glucose-Capillary: 104 mg/dL — ABNORMAL HIGH (ref 65–99)

## 2016-01-29 MED ORDER — POLYETHYLENE GLYCOL 3350 17 G PO PACK: PACK | ORAL | Status: DC

## 2016-01-29 MED ORDER — ALLOPURINOL 100 MG PO TABS: 300.0000 mg | ORAL_TABLET | Freq: Every day | ORAL | Status: DC

## 2016-01-29 MED ORDER — METFORMIN HCL ER 500 MG PO TB24: 1000.0000 mg | ORAL_TABLET | Freq: Two times a day (BID) | ORAL | Status: DC

## 2016-01-29 NOTE — Assessment & Plan Note (Signed)
Patient with complaints of occasional abdominal pain below his umbilicus in the LLQ. Reports dull aching pain that lasts for about a week and resolved on its own. 2-3 months between episodes. Denies any change in bowel habits associated with the pain, no melena or hematochezia. Denies any reflux symptoms, abdominal bloating or distention. No palpable masses. Nothing exacerbates the pain. Pain is improved with Tylenol. He has been seen by GI for this in the past with EGD and Colonoscopy done 09/2014. Only significant for diverticula and gastric erythema. No history of abdominal surgeries.   Abdominal exam today unremarkable. Abdomen soft, NT, ND. No palpable masses.   Plan  Unclear etiology. Symptoms appear to be benign and not causing any issues for the patient other than occasional discomfort and worry. Reassured patient. Do not believe any imaging for further work up is warranted at this time. Will continue MiraLAX and monitor.

## 2016-01-29 NOTE — Assessment & Plan Note (Addendum)
Denies any gout flares. Patient well controlled on allopurinol 300 mg tid. Last uric acid 4.1 in December, well below goal of <6 (despite being on HCTZ).  Plan  Re-check uric acid level Decrease allopurinol to 300 mg daily Repeat uric acid at follow up visit  Instructed to call clinic if he develops a gout flare, treat with steroid course if needed

## 2016-01-29 NOTE — Assessment & Plan Note (Addendum)
Stopped Advair inhaler at last visit as patient was not requiring it and having difficuty using it. Reports needing his albuterol inhaler 2-3 times week. Symptoms improved with albuterol. Denies any tobacco use.   Plan Continue albuterol prn Consider PFTs if symptoms worsening.

## 2016-01-29 NOTE — Assessment & Plan Note (Addendum)
BP Readings from Last 3 Encounters:  01/29/16 128/70  08/28/15 117/73  05/16/15 125/80    Lab Results  Component Value Date   NA 140 08/28/2015   K 4.4 08/28/2015   CREATININE 1.12 08/28/2015    Assessment: Patient currently on Atenolol 25 mg daily and Losartan 100 mg daily and HCTZ 12.5 mg daily. HCTZ started 05/08/15 visit. Reports compliance with his medications. Denies any lightheadedness, dizziness or headaches. BP 128/80 today in clinic. Denies any gout flares.   Plan: Checking Uric acid level today.  Continue current antihypertensive regimen.

## 2016-01-29 NOTE — Patient Instructions (Signed)
Thank you for coming in today  Austin Anderson.  I am changing your Gout medicine today. You can take the 300 mg pill once a day instead of twice a day. If you have a gout attack please call the clinic and we can get you seen for the flare and get you on some medications while you are in the flare and can titrate your dose back up. Otherwise, I would like to see you back in clinic in 3 months to recheck your levels.  Your blood pressure and diabetes are doing great. Keep up the good work. Congratulations on the weight loss.

## 2016-01-29 NOTE — Progress Notes (Signed)
Patient ID: Austin Anderson, male   DOB: 02/11/1951, 65 y.o.   MRN: [id number]   Subjective:   Patient ID: Austin Anderson male   DOB: 11/05/1950 65 y.o.   MRN: [id number]  HPI: [name] is a 65 y.o. male with a past medical history listed below here today for follow up of his HTN, DM, Gout.   For details of today's visit and the status of his chronic medical issues please refer to the assessment and plan.  Past Medical History  Diagnosis Date  . Prostate cancer     Prostatic adenocarcinoma, Gleason score 7 (3+4), s/p radical prostatectomy 12/04/10 by Dr. Alinda Anderson.   . Diabetes mellitus, type 2   . Hypertension   . Asthma     Reported by patient's last physician before coming to [facility].  No PFT's on file.   . Gout   . Diverticulosis    Current Outpatient Prescriptions  Medication Sig Dispense Refill  . albuterol (PROVENTIL HFA;VENTOLIN HFA) 108 (90 BASE) MCG/ACT inhaler Inhale 2 puffs into the lungs every 6 (six) hours as needed for wheezing. 3 Inhaler 3  . allopurinol (ZYLOPRIM) 100 MG tablet Take 3 tablets (300 mg total) by mouth daily. 90 tablet 2  . atenolol (TENORMIN) 25 MG tablet TAKE 1 TABLET BY MOUTH EVERY DAY 90 tablet 0  . atorvastatin (LIPITOR) 40 MG tablet Take 1 tablet (40 mg total) by mouth daily. 30 tablet 2  . diclofenac sodium (VOLTAREN) 1 % GEL Apply 2 g topically 4 (four) times daily. 1 Tube 2  . fluticasone (FLONASE) 50 MCG/ACT nasal spray Place 1 spray into both nostrils daily as needed for allergies or rhinitis. 9.9 g 2  . hydrochlorothiazide (MICROZIDE) 12.5 MG capsule TAKE ONE CAPSULE BY MOUTH EVERY DAY 90 capsule 2  . losartan (COZAAR) 100 MG tablet TAKE 1 TABLET BY MOUTH EVERY DAY 90 tablet 2  . metFORMIN (GLUCOPHAGE-XR) 500 MG 24 hr tablet Take 2 tablets (1,000 mg total) by mouth 2 (two) times daily. 180 tablet 2  . pantoprazole (PROTONIX) 20 MG tablet Take 1 tablet (20 mg total) by mouth daily. 90 tablet 3  . polyethylene glycol (MIRALAX / GLYCOLAX) packet TAKE  17 GM BY MOUTH DAILY. 100 packet 0   No current facility-administered medications for this visit.   Family History  Problem Relation Age of Onset  . Heart attack Father 49  . Heart disease Mother   . Diabetes Sister    Social History   Social History  . Marital Status: Single    Spouse Name: N/A  . Number of Children: N/A  . Years of Education: N/A   Social History Main Topics  . Smoking status: Never Smoker   . Smokeless tobacco: Not on file  . Alcohol Use: No  . Drug Use: No  . Sexual Activity: Not on file   Other Topics Concern  . Not on file   Social History Narrative   The patient lives alone in [place].  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  The patient is on medicaid/medicare, and receives a disability check.  The patient has a sister in [place].  The patient has a 10th grade education.  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  The patient lost his vision at age 16, and now can only barely see light, but no colors or shapes.  The patient  has an aide who comes to his house every week to help him sort his medications.   Review of Systems: Review of Systems  Constitutional: Negative for malaise/fatigue and diaphoresis.  Respiratory: Positive for shortness of breath and wheezing. Negative for cough and sputum production.   Gastrointestinal: Positive for abdominal pain. Negative for nausea, vomiting, diarrhea, constipation, blood in stool and melena.  Neurological: Negative for dizziness, weakness and headaches.   Objective:  Physical Exam: Filed Vitals:   01/29/16 1336  BP: 128/70  Pulse: 74  Temp: 97.9 F (36.6 C)  TempSrc: Oral  Height: 5\' 5"  (1.651 m)  Weight: 225 lb 1.6 oz (102.105 kg)  SpO2: 100%    GENERAL- alert, co-operative, appears as stated age, not in any distress. HEENT- Patient blind, wearing sunglasses CARDIAC- RRR, no murmurs, rubs or  gallops. RESP- Moving equal volumes of air, and clear to auscultation bilaterally, no wheezes or crackles. ABDOMEN- Soft, nontender, bowel sounds present. NEURO- No obvious Cr N abnormality. EXTREMITIES- pulse 2+, symmetric, no pedal edema. SKIN- Warm, dry, No rash or lesion. PSYCH- Normal mood and affect, appropriate thought content and speech.  Assessment & Plan:   Case discussed with Dr. Beryle Beams. Please refer to Problem based charting for further details of today's visit.

## 2016-01-29 NOTE — Assessment & Plan Note (Addendum)
Lab Results  Component Value Date   HGBA1C 6.9 01/29/2016   HGBA1C 7.3 08/28/2015   HGBA1C 7.2 05/08/2015    Assessment: A1c was 7.3 on last visit. He was previously on no DM medications and was started on metfomrin ER 500 mg bid at 04/2015 visit. Reports compliance with his medications. Denies any GI upset. A1c 6.9 today. Weight down 4 lbs, 229 -> 225 lbs today. Reports he has been walking more frequently trying to lose weight. He was also started on Lipitort for CV risk score of 25.5% at 04/2015 visit. Reports compliance. Denies any side effects. LFTs and lipid panel at 08/2015 visit unremarkable.   Plan: Continue current medications DM foot exam

## 2016-01-30 LAB — URIC ACID: Uric Acid: 6.3 mg/dL (ref 3.7–8.6)

## 2016-01-30 NOTE — Progress Notes (Signed)
Medicine attending: Medical history, presenting problems, physical findings, and medications, reviewed with resident physician Dr Nathan Boswell on the day of the patient visit and I concur with his evaluation and management plan. 

## 2016-02-14 DIAGNOSIS — Z8546 Personal history of malignant neoplasm of prostate: Secondary | ICD-10-CM | POA: Diagnosis not present

## 2016-02-14 DIAGNOSIS — N5231 Erectile dysfunction following radical prostatectomy: Secondary | ICD-10-CM | POA: Diagnosis not present

## 2016-02-14 DIAGNOSIS — C61 Malignant neoplasm of prostate: Secondary | ICD-10-CM | POA: Diagnosis not present

## 2016-02-28 ENCOUNTER — Ambulatory Visit: Payer: Self-pay | Admitting: Internal Medicine

## 2016-03-02 ENCOUNTER — Ambulatory Visit (INDEPENDENT_AMBULATORY_CARE_PROVIDER_SITE_OTHER): Payer: Medicare Other | Admitting: Internal Medicine

## 2016-03-02 ENCOUNTER — Encounter: Payer: Self-pay | Admitting: Internal Medicine

## 2016-03-02 VITALS — BP 144/87 | HR 78 | Temp 98.0°F | Ht 65.0 in | Wt 224.0 lb

## 2016-03-02 DIAGNOSIS — R198 Other specified symptoms and signs involving the digestive system and abdomen: Secondary | ICD-10-CM | POA: Diagnosis not present

## 2016-03-02 LAB — POCT URINALYSIS DIPSTICK
Bilirubin, UA: NEGATIVE
Glucose, UA: NEGATIVE
KETONES UA: NEGATIVE
Leukocytes, UA: NEGATIVE
Nitrite, UA: NEGATIVE
PROTEIN UA: NEGATIVE
RBC UA: NEGATIVE
SPEC GRAV UA: 1.025
Urobilinogen, UA: 0.2
pH, UA: 5

## 2016-03-02 NOTE — Assessment & Plan Note (Signed)
Patient continues to complain of recurrent abdominal pain, located below the umbilicus.  He describes the pain as "burning," that radiates through his body.  It is worse with defecation. The pains last 3-4 days and then spontaneously resolve, but the occur every 6 weeks.  Nothing makes it better, but he uses Tylenol 3-4 tabs every 4-5 hours.  He has BMs daily, but they are usually hard and he must strain.  He uses Miralax every other day.  He endorses mild dysuria, but denies urgency, frequency, hesitancy, or dribbling.  On exam, epigastrum, left quadrants, and below umbilicus soft and nontender.  However, superior to umbilicus and right quadrants are firm without discrete mass.  A/P: Abdominal pain, likely 2/2 constipation.  Symptoms likely due to large stool burden causing bowel stretch and pain on defecation, especially when combined with hemorrhoids. This may also be leading to the patient's dysuria by causing a UTI.  However, the abdominal firmness does not feel like stool, and this complaint has been chronic without resolution.  Therefore, CT abd/pelvis is warranted to evaluate the abdomen, as xray would not show soft tissue.    [ ]  U/A [ ]  CT abd/pelvis WO contrast

## 2016-03-02 NOTE — Progress Notes (Signed)
Patient ID: Austin Anderson, male   DOB: 05-Jan-1951, 65 y.o.   MRN: [id number]   Subjective:   Patient ID: Austin Anderson male   DOB: [birth year] 65 y.o.   MRN: [id number]  HPI: [name] is a 65 y.o. male with PMH as below, here for f/u abdominal pain.  Please see Problem-Based charting for the status of the patient's chronic medical issues.     Past Medical History  Diagnosis Date  . Prostate cancer [facility])     Prostatic adenocarcinoma, Gleason score 7 (3+4), s/p radical prostatectomy 12/04/10 by Dr. Alinda Anderson.   . Diabetes mellitus, type 2 ([place])   . Hypertension   . Asthma     Reported by patient's last physician before coming to [facility].  No PFT's on file.   . Gout   . Diverticulosis    Current Outpatient Prescriptions  Medication Sig Dispense Refill  . albuterol (PROVENTIL HFA;VENTOLIN HFA) 108 (90 BASE) MCG/ACT inhaler Inhale 2 puffs into the lungs every 6 (six) hours as needed for wheezing. 3 Inhaler 3  . allopurinol (ZYLOPRIM) 100 MG tablet Take 3 tablets (300 mg total) by mouth daily. 90 tablet 2  . atenolol (TENORMIN) 25 MG tablet TAKE 1 TABLET BY MOUTH EVERY DAY 90 tablet 0  . atorvastatin (LIPITOR) 40 MG tablet Take 1 tablet (40 mg total) by mouth daily. 30 tablet 2  . diclofenac sodium (VOLTAREN) 1 % GEL Apply 2 g topically 4 (four) times daily. 1 Tube 2  . fluticasone (FLONASE) 50 MCG/ACT nasal spray Place 1 spray into both nostrils daily as needed for allergies or rhinitis. 9.9 g 2  . hydrochlorothiazide (MICROZIDE) 12.5 MG capsule TAKE ONE CAPSULE BY MOUTH EVERY DAY 90 capsule 2  . losartan (COZAAR) 100 MG tablet TAKE 1 TABLET BY MOUTH EVERY DAY 90 tablet 2  . metFORMIN (GLUCOPHAGE-XR) 500 MG 24 hr tablet Take 2 tablets (1,000 mg total) by mouth 2 (two) times daily. 180 tablet 2  . pantoprazole (PROTONIX) 20 MG tablet Take 1 tablet (20 mg total) by mouth daily. 90 tablet 3  . polyethylene glycol (MIRALAX / GLYCOLAX) packet TAKE 17 GM BY MOUTH DAILY. 100 packet 0   No current  facility-administered medications for this visit.   Family History  Problem Relation Age of Onset  . Heart attack Father 50  . Heart disease Mother   . Diabetes Sister    Social History   Social History  . Marital Status: Single    Spouse Name: N/A  . Number of Children: N/A  . Years of Education: N/A   Social History Main Topics  . Smoking status: Never Smoker   . Smokeless tobacco: None  . Alcohol Use: No  . Drug Use: No  . Sexual Activity: Not Asked   Other Topics Concern  . None   Social History Narrative   The patient lives alone in [place].  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  The patient is on medicaid/medicare, and receives a disability check.  The patient has a sister in [place].  The patient has a 10th grade education.  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  The patient lost his vision at age 10, and now can only barely see light, but no colors or shapes.  The patient has an aide who comes to his house every week to help him sort his medications.  Review of Systems: No N/V or diarrhea. Endorses constipation, dysuria, and pain with defecation.  Objective:  Physical Exam: Filed Vitals:   03/02/16 0936  BP: 144/87  Pulse: 78  Temp: 98 F (36.7 C)  TempSrc: Oral  Height: 5\' 5"  (1.651 m)  Weight: 224 lb (101.606 kg)  SpO2: 100%   Physical Exam  Constitutional: He is oriented to person, place, and time. No distress.  HENT:  Head: Normocephalic and atraumatic.  Neck: No tracheal deviation present.  Cardiovascular: Normal rate, regular rhythm and normal heart sounds.   Pulmonary/Chest: Effort normal and breath sounds normal. No stridor. No respiratory distress. He has no wheezes.  Abdominal: Soft. He exhibits no distension. There is no tenderness. There is no rebound and no guarding.  Abdominal firmness superior to umbilicus and right quadrants.  Abdomen  nontender to deep palpation.   Neurological: He is alert and oriented to person, place, and time.  Skin: Skin is warm and dry. He is not diaphoretic.     Assessment & Plan:   Patient and case were discussed with Dr. Lynnae January.  Please refer to Problem Based charting for further documentation.

## 2016-03-02 NOTE — Progress Notes (Signed)
Internal Medicine Clinic Attending  I saw and evaluated the patient.  I personally confirmed the key portions of the history and exam documented by Dr. Taylor and I reviewed pertinent patient test results.  The assessment, diagnosis, and plan were formulated together and I agree with the documentation in the resident's note.  

## 2016-03-02 NOTE — Patient Instructions (Signed)
1. Use Miralax one packet every day. 2. Increase your fiber intake. You can use Benefiber or Metamucil. 3. We will get a CT scan of your belly as well. 4. For the pain, you can alternate Advil 2 tabs and Tylenol 2 tabs.   High-Fiber Diet Fiber, also called dietary fiber, is a type of carbohydrate found in fruits, vegetables, whole grains, and beans. A high-fiber diet can have many health benefits. Your health care provider may recommend a high-fiber diet to help:  Prevent constipation. Fiber can make your bowel movements more regular.  Lower your cholesterol.  Relieve hemorrhoids, uncomplicated diverticulosis, or irritable bowel syndrome.  Prevent overeating as part of a weight-loss plan.  Prevent heart disease, type 2 diabetes, and certain cancers. WHAT IS MY PLAN? The recommended daily intake of fiber includes:  38 grams for men under age 4.  2 grams for men over age 31.  69 grams for women under age 42.  71 grams for women over age 84. You can get the recommended daily intake of dietary fiber by eating a variety of fruits, vegetables, grains, and beans. Your health care provider may also recommend a fiber supplement if it is not possible to get enough fiber through your diet. WHAT DO I NEED TO KNOW ABOUT A HIGH-FIBER DIET?  Fiber supplements have not been widely studied for their effectiveness, so it is better to get fiber through food sources.  Always check the fiber content on thenutrition facts label of any prepackaged food. Look for foods that contain at least 5 grams of fiber per serving.  Ask your dietitian if you have questions about specific foods that are related to your condition, especially if those foods are not listed in the following section.  Increase your daily fiber consumption gradually. Increasing your intake of dietary fiber too quickly may cause bloating, cramping, or gas.  Drink plenty of water. Water helps you to digest fiber. WHAT FOODS CAN I  EAT? Grains Whole-grain breads. Multigrain cereal. Oats and oatmeal. Brown rice. Barley. Bulgur wheat. Ewa Gentry. Bran muffins. Popcorn. Rye wafer crackers. Vegetables Sweet potatoes. Spinach. Kale. Artichokes. Cabbage. Broccoli. Green peas. Carrots. Squash. Fruits Berries. Pears. Apples. Oranges. Avocados. Prunes and raisins. Dried figs. Meats and Other Protein Sources Navy, kidney, pinto, and soy beans. Split peas. Lentils. Nuts and seeds. Dairy Fiber-fortified yogurt. Beverages Fiber-fortified soy milk. Fiber-fortified orange juice. Other Fiber bars. The items listed above may not be a complete list of recommended foods or beverages. Contact your dietitian for more options. WHAT FOODS ARE NOT RECOMMENDED? Grains White bread. Pasta made with refined flour. White rice. Vegetables Fried potatoes. Canned vegetables. Well-cooked vegetables.  Fruits Fruit juice. Cooked, strained fruit. Meats and Other Protein Sources Fatty cuts of meat. Fried Sales executive or fried fish. Dairy Milk. Yogurt. Cream cheese. Sour cream. Beverages Soft drinks. Other Cakes and pastries. Butter and oils. The items listed above may not be a complete list of foods and beverages to avoid. Contact your dietitian for more information. WHAT ARE SOME TIPS FOR INCLUDING HIGH-FIBER FOODS IN MY DIET?  Eat a wide variety of high-fiber foods.  Make sure that half of all grains consumed each day are whole grains.  Replace breads and cereals made from refined flour or white flour with whole-grain breads and cereals.  Replace white rice with brown rice, bulgur wheat, or millet.  Start the day with a breakfast that is high in fiber, such as a cereal that contains at least 5 grams of fiber per serving.  Use beans in place of meat in soups, salads, or pasta.  Eat high-fiber snacks, such as berries, raw vegetables, nuts, or popcorn.   This information is not intended to replace advice given to you by your health care  provider. Make sure you discuss any questions you have with your health care provider.   Document Released: 08/31/2005 Document Revised: 09/21/2014 Document Reviewed: 02/13/2014 Elsevier Interactive Patient Education [employer].

## 2016-03-09 ENCOUNTER — Ambulatory Visit (HOSPITAL_COMMUNITY)
Admission: RE | Admit: 2016-03-09 | Discharge: 2016-03-09 | Disposition: A | Discharge status: Home / Self Care | Payer: Medicare Other | Source: Ambulatory Visit | Attending: Internal Medicine | Admitting: Internal Medicine

## 2016-03-09 DIAGNOSIS — R198 Other specified symptoms and signs involving the digestive system and abdomen: Secondary | ICD-10-CM | POA: Diagnosis not present

## 2016-03-09 DIAGNOSIS — K5792 Diverticulitis of intestine, part unspecified, without perforation or abscess without bleeding: Secondary | ICD-10-CM | POA: Insufficient documentation

## 2016-03-09 DIAGNOSIS — K5732 Diverticulitis of large intestine without perforation or abscess without bleeding: Secondary | ICD-10-CM | POA: Diagnosis not present

## 2016-03-10 ENCOUNTER — Other Ambulatory Visit: Payer: Self-pay | Admitting: Internal Medicine

## 2016-03-10 MED ORDER — METRONIDAZOLE 500 MG PO TABS: 500.0000 mg | ORAL_TABLET | Freq: Three times a day (TID) | ORAL | Status: DC

## 2016-03-10 MED ORDER — CIPROFLOXACIN HCL 500 MG PO TABS: 500.0000 mg | ORAL_TABLET | Freq: Two times a day (BID) | ORAL | Status: DC

## 2016-03-24 ENCOUNTER — Ambulatory Visit (INDEPENDENT_AMBULATORY_CARE_PROVIDER_SITE_OTHER): Payer: Medicare Other | Admitting: Internal Medicine

## 2016-03-24 VITALS — BP 143/95 | HR 67 | Temp 98.4°F | Ht 65.0 in | Wt 225.4 lb

## 2016-03-24 DIAGNOSIS — Z09 Encounter for follow-up examination after completed treatment for conditions other than malignant neoplasm: Secondary | ICD-10-CM

## 2016-03-24 DIAGNOSIS — Z8719 Personal history of other diseases of the digestive system: Secondary | ICD-10-CM | POA: Diagnosis not present

## 2016-03-24 DIAGNOSIS — K5732 Diverticulitis of large intestine without perforation or abscess without bleeding: Secondary | ICD-10-CM

## 2016-03-24 MED ORDER — BISACODYL 5 MG PO TBEC: 5.0000 mg | DELAYED_RELEASE_TABLET | Freq: Every day | ORAL | Status: DC | PRN

## 2016-03-24 NOTE — Patient Instructions (Signed)
Thank you for coming to see me today. It was a pleasure. Today we talked about:   Colon infection: i'm glad you are feeling so much better and completed your course of antibiotics!  From now, we need to optimize your bowel regimen.  Please continue taking miralax daily.  I also have added a stool softener to your medication list for you to take as needed in order to have a daily bowel movement.  Please follow-up with your PCP as scheduled.  If you have any questions or concerns, please do not hesitate to call the office at [phone].  Take Care,   Jule Ser, DO

## 2016-03-24 NOTE — Assessment & Plan Note (Signed)
A: Patient had CT scan that showed diverticulitis without abscess.  Treated with 7 days of ciprofloxacin and metronidazole, which patient has completed.  He reports feeling "95% better" and is no longer having abdominal pain or the pain with defecation.  Had colonoscopy in Jan 2016 with Dr. Penelope Coop and had diverticula in the sigmoid and descending colon with recommendation to repeat colonoscopy in 10 years.  P: - optimize bowel habits - continue Miralax daily and added Dulcolax daily as needed in order to maintain a daily, soft bowel movement.  Also encouraged plenty of fiber - take cipro and flagyl off medication list - rtc as needed if symptoms return or worsen

## 2016-03-24 NOTE — Progress Notes (Signed)
Patient ID: Austin Anderson, male   DOB: 05/24/1951, 65 y.o.   MRN: [id number]   CC: follow up diverticulitis  HPI:  Austin Anderson is a 65 y.o. man with history as below who presents today for follow up of his diveriticulitis.  Please see A&P for status of patients medical conditions addressed today.  Past Medical History  Diagnosis Date  . Prostate cancer [facility])     Prostatic adenocarcinoma, Gleason score 7 (3+4), s/p radical prostatectomy 12/04/10 by Dr. [name].   . Diabetes mellitus, type 2 ([place])   . Hypertension   . Asthma     Reported by patient's last physician before coming to [facility].  No PFT's on file.   . Gout   . Diverticulosis     Review of Systems:   Review of Systems  Constitutional: Negative for fever and chills.  Gastrointestinal: Positive for abdominal pain (improved). Negative for nausea, vomiting, diarrhea, constipation and blood in stool.     Physical Exam:  Filed Vitals:   03/24/16 1012  BP: 143/95  Pulse: 67  Temp: 98.4 F (36.9 C)  TempSrc: Oral  Height: 5\' 5"  (1.651 m)  Weight: 225 lb 6.4 oz (102.241 kg)  SpO2: 100%   Physical Exam  Constitutional: He is oriented to person, place, and time and well-developed, well-nourished, and in no distress.  Eyes:  Blind, ambulates with assistance of walking stick  Abdominal: Soft. Bowel sounds are normal. He exhibits no distension and no mass. There is no tenderness. There is no rebound and no guarding.  Neurological: He is alert and oriented to person, place, and time.  Psychiatric: Affect normal.     Assessment & Plan:   See encounters tab for problem based medical decision making.   Patient discussed with Dr. Angelia Mould  Diverticulitis A: Patient had CT scan that showed diverticulitis without abscess.  Treated with 7 days of ciprofloxacin and metronidazole, which patient has completed.  He reports feeling "95% better" and is no longer having abdominal pain or the pain with defecation.  Had  colonoscopy in Jan 2016 with Dr. Penelope Coop and had diverticula in the sigmoid and descending colon with recommendation to repeat colonoscopy in 10 years.  P: - optimize bowel habits - continue Miralax daily and added Dulcolax daily as needed in order to maintain a daily, soft bowel movement.  Also encouraged plenty of fiber - take cipro and flagyl off medication list - rtc as needed if symptoms return or worsen

## 2016-03-24 NOTE — Progress Notes (Signed)
Internal Medicine Clinic Attending  Case discussed with Dr. Wallace at the time of the visit.  We reviewed the resident's history and exam and pertinent patient test results.  I agree with the assessment, diagnosis, and plan of care documented in the resident's note.  

## 2016-04-22 ENCOUNTER — Encounter: Payer: Medicare Other | Admitting: Internal Medicine

## 2016-04-24 ENCOUNTER — Other Ambulatory Visit: Payer: Self-pay | Admitting: Internal Medicine

## 2016-04-29 ENCOUNTER — Other Ambulatory Visit: Payer: Self-pay | Admitting: Internal Medicine

## 2016-04-29 DIAGNOSIS — M109 Gout, unspecified: Secondary | ICD-10-CM

## 2016-07-08 ENCOUNTER — Encounter: Payer: Self-pay | Admitting: Internal Medicine

## 2016-07-08 ENCOUNTER — Ambulatory Visit (INDEPENDENT_AMBULATORY_CARE_PROVIDER_SITE_OTHER): Payer: Medicare Other | Admitting: Internal Medicine

## 2016-07-08 VITALS — BP 111/72 | HR 84 | Temp 97.9°F | Ht 65.0 in | Wt 216.5 lb

## 2016-07-08 DIAGNOSIS — M109 Gout, unspecified: Secondary | ICD-10-CM

## 2016-07-08 DIAGNOSIS — Z9079 Acquired absence of other genital organ(s): Secondary | ICD-10-CM

## 2016-07-08 DIAGNOSIS — Z23 Encounter for immunization: Secondary | ICD-10-CM

## 2016-07-08 DIAGNOSIS — I10 Essential (primary) hypertension: Secondary | ICD-10-CM | POA: Diagnosis not present

## 2016-07-08 DIAGNOSIS — Z8546 Personal history of malignant neoplasm of prostate: Secondary | ICD-10-CM

## 2016-07-08 DIAGNOSIS — J45909 Unspecified asthma, uncomplicated: Secondary | ICD-10-CM | POA: Diagnosis not present

## 2016-07-08 DIAGNOSIS — E119 Type 2 diabetes mellitus without complications: Secondary | ICD-10-CM

## 2016-07-08 DIAGNOSIS — Z8719 Personal history of other diseases of the digestive system: Secondary | ICD-10-CM

## 2016-07-08 DIAGNOSIS — Z0000 Encounter for general adult medical examination without abnormal findings: Secondary | ICD-10-CM

## 2016-07-08 DIAGNOSIS — E785 Hyperlipidemia, unspecified: Secondary | ICD-10-CM | POA: Diagnosis not present

## 2016-07-08 DIAGNOSIS — Z7984 Long term (current) use of oral hypoglycemic drugs: Secondary | ICD-10-CM | POA: Diagnosis not present

## 2016-07-08 DIAGNOSIS — K5732 Diverticulitis of large intestine without perforation or abscess without bleeding: Secondary | ICD-10-CM

## 2016-07-08 DIAGNOSIS — Z79899 Other long term (current) drug therapy: Secondary | ICD-10-CM

## 2016-07-08 DIAGNOSIS — C61 Malignant neoplasm of prostate: Secondary | ICD-10-CM

## 2016-07-08 DIAGNOSIS — H547 Unspecified visual loss: Secondary | ICD-10-CM

## 2016-07-08 LAB — GLUCOSE, CAPILLARY: Glucose-Capillary: 121 mg/dL — ABNORMAL HIGH (ref 65–99)

## 2016-07-08 LAB — POCT GLYCOSYLATED HEMOGLOBIN (HGB A1C): Hemoglobin A1C: 6.8

## 2016-07-08 NOTE — Patient Instructions (Signed)
Austin Anderson,  I am glad you are doing well. Everything is looking good. Your Diabetes is well controlled, your blood pressures are doing well. If you have any problems or issues please contact us. Otherwise we can follow up in 3 months.

## 2016-07-08 NOTE — Progress Notes (Signed)
   CC: DM, HTN follow up  HPI:  [name] is a 65 y.o. male with a past medical history listed below here today for follow up of his DM and HTN.   Diverticulitis - Resolved after cipro x 7 days. No recurrent symptoms. Regular bowel movements every day. Firm and harder if not using the miralax. Rarely hard enough to cause any pain. Using Miralax every other day. Abdominal pain has 90% resolved since the abx course.    Gout - Uric acid 6.3 five months ago. Allopurinol 300 mg daily currently. No flares.   DM - Last A1c 6.9. On Mefromin 1000 mg ER bid. Does not check blood sugars at home.   HLD - Lipitor 40 mg daily  Asthma - Albuterol prn. Using every other day. Never gets short of breath. Has issues with congestion and the albtuerol helps clear the mucus.   HTN - Atenolol 25 mg daily, Lostatan 100 mg daily, HCTZ 12.5 mg daily.  Hx of prostate Ca - Urology has requested we follow PSA annual. PSA > 0.2 refer back to urology.  S/p radical prostatectomy 5 years ago.   HCM - Flu shot today. PPSV23 at next visit, does not wish to have multiple shots today.   Past Medical History:  Diagnosis Date  . Asthma    Reported by patient's last physician before coming to [facility].  No PFT's on file.   . Diabetes mellitus, type 2 ([place])   . Diverticulosis   . Gout   . Hypertension   . Prostate cancer [facility])    Prostatic adenocarcinoma, Gleason score 7 (3+4), s/p radical prostatectomy 12/04/10 by Dr. [name].     Review of Systems:   Review of Systems  Constitutional: Negative for chills and fever.  Eyes: Negative for blurred vision and double vision.  Gastrointestinal: Negative for abdominal pain, diarrhea, nausea and vomiting.  Musculoskeletal: Negative for falls and joint pain.  Neurological: Negative for headaches.   Physical Exam:  Vitals:   07/08/16 1346  BP: 111/72  Pulse: 84  Temp: 97.9 F (36.6 C)  TempSrc: Oral  SpO2: 100%  Weight: 216 lb 8 oz (98.2 kg)  Height: 5\' 5"  (1.651  m)   Physical Exam  Constitutional: He is oriented to person, place, and time and well-developed, well-nourished, and in no distress.  Eyes:  Blind, ambulates with assistance of walking stick  Abdominal: Soft. Bowel sounds are normal. He exhibits no distension and no mass. There is no tenderness. There is no rebound and no guarding.  Neurological: He is alert and oriented to person, place, and time.  Psychiatric: Affect normal.    Assessment & Plan:   See Encounters Tab for problem based charting.  Patient discussed with Dr. Evette Doffing

## 2016-07-09 LAB — BMP8+ANION GAP
ANION GAP: 21 mmol/L — AB (ref 10.0–18.0)
BUN/Creatinine Ratio: 14 (ref 10–24)
BUN: 21 mg/dL (ref 8–27)
CALCIUM: 10 mg/dL (ref 8.6–10.2)
CHLORIDE: 100 mmol/L (ref 96–106)
CO2: 21 mmol/L (ref 18–29)
Creatinine, Ser: 1.45 mg/dL — ABNORMAL HIGH (ref 0.76–1.27)
GFR calc non Af Amer: 51 mL/min/{1.73_m2} — ABNORMAL LOW (ref >59)
GFR, EST AFRICAN AMERICAN: 59 mL/min/{1.73_m2} — AB (ref >59)
GLUCOSE: 118 mg/dL — AB (ref 65–99)
POTASSIUM: 4.3 mmol/L (ref 3.5–5.2)
Sodium: 142 mmol/L (ref 134–144)

## 2016-07-09 LAB — URIC ACID: Uric Acid: 9.6 mg/dL — ABNORMAL HIGH (ref 3.7–8.6)

## 2016-07-10 ENCOUNTER — Other Ambulatory Visit: Payer: Self-pay | Admitting: Internal Medicine

## 2016-07-10 DIAGNOSIS — R198 Other specified symptoms and signs involving the digestive system and abdomen: Secondary | ICD-10-CM

## 2016-07-12 MED ORDER — ALLOPURINOL 100 MG PO TABS: 400.0000 mg | ORAL_TABLET | Freq: Every day | ORAL | 2 refills | Status: DC

## 2016-07-12 NOTE — Assessment & Plan Note (Signed)
Lab Results  Component Value Date   HGBA1C 6.8 07/08/2016   HGBA1C 6.9 01/29/2016   HGBA1C 7.3 08/28/2015     Assessment:  Last A1c 6.9. On Mefromin 1000 mg ER bid. Does not check blood sugars at home.   A1c is 6.8 today.  Plan: Will continue current medications today.

## 2016-07-12 NOTE — Assessment & Plan Note (Signed)
Flu shot today. PPSV23 at next visit, does not wish to have multiple shots today.

## 2016-07-12 NOTE — Assessment & Plan Note (Signed)
Patient has Albuterol prn. Using every other day. Never gets short of breath. Has issues with congestion and the albtuerol helps clear the mucus.  Wheezing/rhonichi today diffusely.  Plan Will continue albuterol as needed

## 2016-07-12 NOTE — Assessment & Plan Note (Signed)
BP Readings from Last 3 Encounters:  07/08/16 111/72  03/24/16 (!) 143/95  03/02/16 (!) 144/87    Lab Results  Component Value Date   NA 142 07/08/2016   K 4.3 07/08/2016   CREATININE 1.45 (H) 07/08/2016    Assessment: Atenolol 25 mg daily, Lostatan 100 mg daily, HCTZ 12.5 mg daily. BP well controlled today.  Plan: Continue current medications Checking BMET today  Addendum  Cr bumped from 1.1 to 1.45 today. Will need repeat check at follow up.

## 2016-07-12 NOTE — Assessment & Plan Note (Signed)
Resolved after cipro x 7 days. No recurrent symptoms. Regular bowel movements every day. Firm and harder if not using the miralax. Rarely hard enough to cause any pain. Using Miralax every other day. Abdominal pain has 90% resolved since the abx course.

## 2016-07-12 NOTE — Assessment & Plan Note (Signed)
Uric acid 6.3 five months ago. Allopurinol 300 mg daily currently. No flares.  Plan  Will check Uric acid today.  Addendum  Uric acid is 9.6 today. Will increase Allopurinol dose to 400 mg daily. Will need to monitor closely with Cr bump.

## 2016-07-12 NOTE — Assessment & Plan Note (Signed)
Urology has requested we follow PSA annual. PSA > 0.2 refer back to urology.  S/p radical prostatectomy 5 years ago.   Plan Will be due for PSA check this June

## 2016-07-14 ENCOUNTER — Other Ambulatory Visit: Payer: Self-pay | Admitting: Internal Medicine

## 2016-07-14 NOTE — Progress Notes (Signed)
Internal Medicine Clinic Attending  Case discussed with Dr. Boswell at the time of the visit.  We reviewed the resident's history and exam and pertinent patient test results.  I agree with the assessment, diagnosis, and plan of care documented in the resident's note.  

## 2016-07-21 ENCOUNTER — Other Ambulatory Visit: Payer: Self-pay | Admitting: *Deleted

## 2016-07-21 DIAGNOSIS — M109 Gout, unspecified: Secondary | ICD-10-CM

## 2016-07-21 MED ORDER — ALLOPURINOL 100 MG PO TABS: 400.0000 mg | ORAL_TABLET | Freq: Every day | ORAL | 1 refills | Status: DC

## 2016-07-23 ENCOUNTER — Other Ambulatory Visit: Payer: Self-pay

## 2016-07-23 DIAGNOSIS — E119 Type 2 diabetes mellitus without complications: Secondary | ICD-10-CM

## 2016-07-23 NOTE — Telephone Encounter (Signed)
metFORMIN (GLUCOPHAGE-XR) 500, Refill request.

## 2016-07-24 MED ORDER — METFORMIN HCL ER 500 MG PO TB24
1000.0000 mg | ORAL_TABLET | Freq: Two times a day (BID) | ORAL | 2 refills | Status: DC
Start: 2016-07-24 — End: 2017-05-02

## 2016-10-14 ENCOUNTER — Encounter: Payer: Self-pay | Admitting: Internal Medicine

## 2016-10-15 ENCOUNTER — Ambulatory Visit (INDEPENDENT_AMBULATORY_CARE_PROVIDER_SITE_OTHER): Payer: Medicare Other | Admitting: Internal Medicine

## 2016-10-15 VITALS — BP 130/80 | HR 74 | Temp 98.0°F | Ht 65.0 in | Wt 215.4 lb

## 2016-10-15 DIAGNOSIS — M109 Gout, unspecified: Secondary | ICD-10-CM

## 2016-10-15 DIAGNOSIS — Z0000 Encounter for general adult medical examination without abnormal findings: Secondary | ICD-10-CM | POA: Diagnosis not present

## 2016-10-15 DIAGNOSIS — Z7984 Long term (current) use of oral hypoglycemic drugs: Secondary | ICD-10-CM | POA: Diagnosis not present

## 2016-10-15 DIAGNOSIS — I129 Hypertensive chronic kidney disease with stage 1 through stage 4 chronic kidney disease, or unspecified chronic kidney disease: Secondary | ICD-10-CM

## 2016-10-15 DIAGNOSIS — Z114 Encounter for screening for human immunodeficiency virus [HIV]: Secondary | ICD-10-CM

## 2016-10-15 DIAGNOSIS — E119 Type 2 diabetes mellitus without complications: Secondary | ICD-10-CM

## 2016-10-15 DIAGNOSIS — I10 Essential (primary) hypertension: Secondary | ICD-10-CM | POA: Diagnosis not present

## 2016-10-15 DIAGNOSIS — N182 Chronic kidney disease, stage 2 (mild): Secondary | ICD-10-CM | POA: Diagnosis not present

## 2016-10-15 DIAGNOSIS — E1122 Type 2 diabetes mellitus with diabetic chronic kidney disease: Secondary | ICD-10-CM | POA: Diagnosis not present

## 2016-10-15 DIAGNOSIS — Z23 Encounter for immunization: Secondary | ICD-10-CM

## 2016-10-15 DIAGNOSIS — Z79899 Other long term (current) drug therapy: Secondary | ICD-10-CM

## 2016-10-15 LAB — POCT GLYCOSYLATED HEMOGLOBIN (HGB A1C): Hemoglobin A1C: 6.7

## 2016-10-15 LAB — GLUCOSE, CAPILLARY: Glucose-Capillary: 111 mg/dL — ABNORMAL HIGH (ref 65–99)

## 2016-10-15 NOTE — Assessment & Plan Note (Addendum)
BP Readings from Last 3 Encounters:  10/15/16 130/80  07/08/16 111/72  03/24/16 (!) 143/95    Lab Results  Component Value Date   NA 142 07/08/2016   K 4.3 07/08/2016   CREATININE 1.45 (H) 07/08/2016   BP 130/80. Currently on Atenolol 25 mg daily, Losartan 100 mg daily, HCTZ 12.5 mg daily at home. Reports he has not taken the Losartan today. Just forgot.  No headaches, nausea, vomiting or lightheadedness/dizziness.  Assessment: Stable HTN  Plan: Continue current medications

## 2016-10-15 NOTE — Patient Instructions (Signed)
Mr. Whiteford,  I am going to check some lab work on you today. Last visit your kidney function was a little worse than before. If it is still elevated I will have you come back for some additional lab work (won't need an appointment - just a lab visit) and come back to see me sooner.   I would like to see you back in 6 months for follow up.

## 2016-10-15 NOTE — Assessment & Plan Note (Addendum)
Lab Results  Component Value Date   HGBA1C 6.7 10/15/2016   HGBA1C 6.8 07/08/2016   HGBA1C 6.9 01/29/2016   A1C 6.7 today. CBG 111 today. Currently metformin XR 1000 mg bid at home. Denies any nausea, vomiting, diarrhea. Denies ever having any episodes of hypoglycemia.   Assessment: Well controlled DM 2  Plan: Continue metformin Microalbumin/Cr today

## 2016-10-15 NOTE — Assessment & Plan Note (Addendum)
No urinary complaints today. His Cr 1.1 > 1.45 on last check. GFR >60 to 59.  Assessment: worsened kidney disease  Plan: Repeat BMET today. Follow up results, will have RTC for further work up if still elevated or worsened.

## 2016-10-15 NOTE — Assessment & Plan Note (Signed)
Received PPSV23 today. Does not wish to get tetanus booster today, would only like one shot today.  Checking HIV and Hep C today.

## 2016-10-15 NOTE — Addendum Note (Signed)
Addended by: Marcelino Duster on: 10/15/2016 04:40 PM   Modules accepted: Orders

## 2016-10-15 NOTE — Assessment & Plan Note (Addendum)
Uric acid 9.6 at last visit. Reports he has just been taking allopurinol 100 mg daily. Sometimes misses doses. No gout flares.   Assessment: Worsened gout  Plan: Re-checking uric acid and bmet today.  Follow up results, if renal function back to baseline will increase to 200 mg daily

## 2016-10-15 NOTE — Progress Notes (Signed)
   CC: DM follow up  HPI:  Austin Anderson is a 66 y.o. male with a past medical history listed below here today for follow up of his DM and HTN   For details of today's visit and the status of his chronic medical issues please refer to the assessment and plan.    Past Medical History:  Diagnosis Date  . Asthma    Reported by patient's last physician before coming to [facility].  No PFT's on file.   . Diabetes mellitus, type 2 ([place])   . Diverticulosis   . Gout   . Hypertension   . Prostate cancer [facility]    Prostatic adenocarcinoma, Gleason score 7 (3+4), s/p radical prostatectomy 12/04/10 by Dr. [name].     Review of Systems:   See HPI  Physical Exam:  Vitals:   10/15/16 1319  BP: (!) 130/92  Pulse: 74  Temp: 98 F (36.7 C)  TempSrc: Oral  SpO2: 100%  Weight: 215 lb 6.4 oz (97.7 kg)  Height: 5\' 5"  (1.651 m)   Physical Exam  Constitutional: He is well-developed, well-nourished, and in no distress. No distress.  Blind, ambulates with assistance of walking stick   Cardiovascular: Normal rate, regular rhythm and normal heart sounds.   Pulmonary/Chest: Breath sounds normal.  Psychiatric: Mood and affect normal.    Assessment & Plan:   See Encounters Tab for problem based charting.  Patient discussed with Dr. Daryll Drown

## 2016-10-16 LAB — HIV ANTIBODY (ROUTINE TESTING W REFLEX): HIV SCREEN 4TH GENERATION: NONREACTIVE

## 2016-10-16 LAB — MICROALBUMIN / CREATININE URINE RATIO
Creatinine, Urine: 200.5 mg/dL
Microalb/Creat Ratio: 9.8 mg/g creat (ref 0.0–30.0)
Microalbumin, Urine: 19.6 ug/mL

## 2016-10-16 LAB — BMP8+ANION GAP
Anion Gap: 18 mmol/L (ref 10.0–18.0)
BUN / CREAT RATIO: 15 (ref 10–24)
BUN: 21 mg/dL (ref 8–27)
CO2: 23 mmol/L (ref 18–29)
CREATININE: 1.43 mg/dL — AB (ref 0.76–1.27)
Calcium: 10 mg/dL (ref 8.6–10.2)
Chloride: 96 mmol/L (ref 96–106)
GFR, EST AFRICAN AMERICAN: 60 mL/min/{1.73_m2} (ref >59)
GFR, EST NON AFRICAN AMERICAN: 52 mL/min/{1.73_m2} — AB (ref >59)
Glucose: 99 mg/dL (ref 65–99)
Potassium: 4.4 mmol/L (ref 3.5–5.2)
SODIUM: 137 mmol/L (ref 134–144)

## 2016-10-16 LAB — HEPATITIS C ANTIBODY

## 2016-10-16 LAB — URIC ACID: Uric Acid: 10.6 mg/dL — ABNORMAL HIGH (ref 3.7–8.6)

## 2016-10-20 NOTE — Progress Notes (Signed)
Internal Medicine Clinic Attending  Case discussed with Dr. Boswell soon after the resident saw the patient.  We reviewed the resident's history and exam and pertinent patient test results.  I agree with the assessment, diagnosis, and plan of care documented in the resident's note. 

## 2016-10-25 ENCOUNTER — Other Ambulatory Visit: Payer: Self-pay | Admitting: Internal Medicine

## 2016-11-04 ENCOUNTER — Other Ambulatory Visit: Payer: Self-pay | Admitting: *Deleted

## 2016-11-04 DIAGNOSIS — E119 Type 2 diabetes mellitus without complications: Secondary | ICD-10-CM

## 2016-11-05 MED ORDER — ATORVASTATIN CALCIUM 40 MG PO TABS: 40.0000 mg | ORAL_TABLET | Freq: Every day | ORAL | 2 refills | Status: DC

## 2016-12-04 ENCOUNTER — Other Ambulatory Visit: Payer: Self-pay | Admitting: Internal Medicine

## 2016-12-04 DIAGNOSIS — R198 Other specified symptoms and signs involving the digestive system and abdomen: Secondary | ICD-10-CM

## 2017-01-04 ENCOUNTER — Ambulatory Visit (INDEPENDENT_AMBULATORY_CARE_PROVIDER_SITE_OTHER): Payer: Medicare Other | Admitting: Internal Medicine

## 2017-01-04 ENCOUNTER — Encounter: Payer: Self-pay | Admitting: Internal Medicine

## 2017-01-04 VITALS — BP 143/88 | HR 77 | Temp 98.2°F | Wt 221.1 lb

## 2017-01-04 DIAGNOSIS — G44229 Chronic tension-type headache, not intractable: Secondary | ICD-10-CM

## 2017-01-04 DIAGNOSIS — R51 Headache: Secondary | ICD-10-CM

## 2017-01-04 DIAGNOSIS — Z7409 Other reduced mobility: Secondary | ICD-10-CM | POA: Diagnosis not present

## 2017-01-04 DIAGNOSIS — H547 Unspecified visual loss: Secondary | ICD-10-CM

## 2017-01-04 DIAGNOSIS — Z026 Encounter for examination for insurance purposes: Secondary | ICD-10-CM

## 2017-01-04 DIAGNOSIS — R519 Headache, unspecified: Secondary | ICD-10-CM | POA: Insufficient documentation

## 2017-01-04 HISTORY — DX: Other reduced mobility: Z74.09

## 2017-01-04 NOTE — Assessment & Plan Note (Signed)
Mr. Levick reports he needs a new referral for PCS. He is blind and has several needs. Needs help with getting in and out of the shower (has fallen a couple of times), medications, dressing himself, feeding himself.   Will fill out paper work today and arrange for evaluation.

## 2017-01-04 NOTE — Progress Notes (Signed)
Internal Medicine Clinic Attending  Case discussed with Dr. Boswell at the time of the visit.  We reviewed the resident's history and exam and pertinent patient test results.  I agree with the assessment, diagnosis, and plan of care documented in the resident's note.  

## 2017-01-04 NOTE — Assessment & Plan Note (Signed)
Reports he has been having headaches for over 10 years but have been increasing in frequency. Reports a squeezing/pressure pain across his head. Says he is having headaches every couple of weeks now whereas before they were much more infrequent. Has not changed in character at all. Has been taking BC/goody powder that helps with the headaches. No aura. No nausea, vomiting. No photosensitivity. Resolve after 2-3 hours without intervention. Resolves within an hour with goody powder. Resolve after taking a nap.   Plan: Description consistent with tension type headaches. No alarm symptoms and neuro exam benign today. He is not taking NSAIDs frequently enough to be concerning for rebound headaches. Concerning factors are age >13 and increasing frequency. Discussed symptoms to monitor for and if continues to have more frequent headaches or change in character will get imaging.

## 2017-01-04 NOTE — Patient Instructions (Signed)
Austin Anderson,  We will fill out your paper work and get someone to come out and evaluate you for continued assistance.   Your headaches are what we call tension headaches. Please try to avoid using the [facility] powder and try Tylenol as needed.    Tension Headache A tension headache is a feeling of pain, pressure, or aching that is often felt over the front and sides of the head. The pain can be dull, or it can feel tight (constricting). Tension headaches are not normally associated with nausea or vomiting, and they do not get worse with physical activity. Tension headaches can last from 30 minutes to several days. This is the most common type of headache. CAUSES The exact cause of this condition is not known. Tension headaches often begin after stress, anxiety, or depression. Other triggers may include:  Alcohol.  Too much caffeine, or caffeine withdrawal.  Respiratory infections, such as colds, flu, or sinus infections.  Dental problems or teeth clenching.  Fatigue.  Holding your head and neck in the same position for a long period of time, such as while using a computer.  Smoking. SYMPTOMS Symptoms of this condition include:  A feeling of pressure around the head.  Dull, aching head pain.  Pain felt over the front and sides of the head.  Tenderness in the muscles of the head, neck, and shoulders. DIAGNOSIS This condition may be diagnosed based on your symptoms and a physical exam. Tests may be done, such as a CT scan or an MRI of your head. These tests may be done if your symptoms are severe or unusual. TREATMENT This condition may be treated with lifestyle changes and medicines to help relieve symptoms. HOME CARE INSTRUCTIONS Managing Pain   Take over-the-counter and prescription medicines only as told by your health care provider.  Lie down in a dark, quiet room when you have a headache.  If directed, apply ice to the head and neck area:  Put ice in a plastic bag.  Place  a towel between your skin and the bag.  Leave the ice on for 20 minutes, 2-3 times per day.  Use a heating pad or a hot shower to apply heat to the head and neck area as told by your health care provider. Eating and Drinking   Eat meals on a regular schedule.  Limit alcohol use.  Decrease your caffeine intake, or stop using caffeine. General Instructions   Keep all follow-up visits as told by your health care provider. This is important.  Keep a headache journal to help find out what may trigger your headaches. For example, write down:  What you eat and drink.  How much sleep you get.  Any change to your diet or medicines.  Try massage or other relaxation techniques.  Limit stress.  Sit up straight, and avoid tensing your muscles.  Do not use tobacco products, including cigarettes, chewing tobacco, or e-cigarettes. If you need help quitting, ask your health care provider.  Exercise regularly as told by your health care provider.  Get 7-9 hours of sleep, or the amount recommended by your health care provider. SEEK MEDICAL CARE IF:  Your symptoms are not helped by medicine.  You have a headache that is different from what you normally experience.  You have nausea or you vomit.  You have a fever. SEEK IMMEDIATE MEDICAL CARE IF:  Your headache becomes severe.  You have repeated vomiting.  You have a stiff neck.  You have a loss  of vision.  You have problems with speech.  You have pain in your eye or ear.  You have muscular weakness or loss of muscle control.  You lose your balance or you have trouble walking.  You feel faint or you pass out.  You have confusion. This information is not intended to replace advice given to you by your health care provider. Make sure you discuss any questions you have with your health care provider. Document Released: 08/31/2005 Document Revised: 05/22/2015 Document Reviewed: 12/24/2014 Elsevier Interactive Patient  Education  2017 [employer].

## 2017-01-04 NOTE — Progress Notes (Signed)
   CC: Headache  HPI:  [name] is a 66 y.o. male with a past medical history listed below here today with complaints of headache.  For details of today's visit and the status of his chronic medical issues please refer to the assessment and plan.  Past Medical History:  Diagnosis Date  . Asthma    Reported by patient's last physician before coming to [facility].  No PFT's on file.   . Diabetes mellitus, type 2 ([employer])   . Diverticulosis   . Gout   . Hypertension   . Prostate cancer [facility])    Prostatic adenocarcinoma, Gleason score 7 (3+4), s/p radical prostatectomy 12/04/10 by Dr. Alinda Anderson.     Review of Systems:  See HPI  Physical Exam:  Vitals:   01/04/17 1310  BP: (!) 143/88  Pulse: 77  Temp: 98.2 F (36.8 C)  TempSrc: Oral  SpO2: 100%  Weight: 221 lb 1.6 oz (100.3 kg)   Physical Exam  Constitutional: He is oriented to person, place, and time and well-developed, well-nourished, and in no distress. No distress.  Cardiovascular: Normal rate and regular rhythm.   Pulmonary/Chest: Effort normal and breath sounds normal.  Neurological: He is alert and oriented to person, place, and time. He has normal sensation, normal strength, normal reflexes and intact cranial nerves.  Patient blind; unable to assess EOMI and pupillary reflex   Vitals reviewed.    Assessment & Plan:   See Encounters Tab for problem based charting.  Patient discussed with Dr. Lynnae Anderson

## 2017-02-02 ENCOUNTER — Telehealth: Payer: Self-pay

## 2017-02-02 NOTE — Telephone Encounter (Signed)
Patient called to inquire if request for personal care services had been faxed to state. Advised patient that no referral was received for this services but I would initiate form and have PCP complete form ASAP.

## 2017-03-26 ENCOUNTER — Other Ambulatory Visit: Payer: Self-pay | Admitting: Internal Medicine

## 2017-03-26 DIAGNOSIS — R198 Other specified symptoms and signs involving the digestive system and abdomen: Secondary | ICD-10-CM

## 2017-03-30 ENCOUNTER — Ambulatory Visit (INDEPENDENT_AMBULATORY_CARE_PROVIDER_SITE_OTHER): Payer: Medicare Other | Admitting: Internal Medicine

## 2017-03-30 VITALS — BP 134/77 | HR 74 | Temp 98.2°F | Wt 215.8 lb

## 2017-03-30 DIAGNOSIS — I10 Essential (primary) hypertension: Secondary | ICD-10-CM | POA: Diagnosis not present

## 2017-03-30 DIAGNOSIS — Z8249 Family history of ischemic heart disease and other diseases of the circulatory system: Secondary | ICD-10-CM | POA: Diagnosis not present

## 2017-03-30 DIAGNOSIS — Z833 Family history of diabetes mellitus: Secondary | ICD-10-CM

## 2017-03-30 DIAGNOSIS — E118 Type 2 diabetes mellitus with unspecified complications: Secondary | ICD-10-CM

## 2017-03-30 DIAGNOSIS — Z7984 Long term (current) use of oral hypoglycemic drugs: Secondary | ICD-10-CM | POA: Diagnosis not present

## 2017-03-30 DIAGNOSIS — E1121 Type 2 diabetes mellitus with diabetic nephropathy: Secondary | ICD-10-CM

## 2017-03-30 DIAGNOSIS — Z79899 Other long term (current) drug therapy: Secondary | ICD-10-CM | POA: Diagnosis not present

## 2017-03-30 LAB — POCT GLYCOSYLATED HEMOGLOBIN (HGB A1C): Hemoglobin A1C: 6.7

## 2017-03-30 LAB — GLUCOSE, CAPILLARY: GLUCOSE-CAPILLARY: 120 mg/dL — AB (ref 65–99)

## 2017-03-30 NOTE — Assessment & Plan Note (Signed)
Assessment: Essential hypertension Patient's blood pressure is 134/77 today, stable and at goal. Patient reports compliance with atenolol, losartan, and hydrochlorothiazide.  Plan -Continue current blood pressure regimen

## 2017-03-30 NOTE — Progress Notes (Signed)
Internal Medicine Clinic Attending  Case discussed with Dr. Hoffman at the time of the visit.  We reviewed the resident's history and exam and pertinent patient test results.  I agree with the assessment, diagnosis, and plan of care documented in the resident's note.  

## 2017-03-30 NOTE — Patient Instructions (Signed)
Mr. Wiacek,  It was a pleasure meeting you today. Please follow-up with your primary care provider in 3 months.  In the meantime please visit the acute care clinic for any acute medical needs.

## 2017-03-30 NOTE — Progress Notes (Signed)
   CC: Follow up on type II diabetes mellitus   HPI:  [name] is a 66 y.o. male with history of type II diabetes mellitus that presents to the acute care clinic for follow up on type II diabetes mellitus.  Please see problem based charting for the status of patient's chronic medical condition.  Past Medical History:  Diagnosis Date  . Asthma    Reported by patient's last physician before coming to [facility].  No PFT's on file.   . Diabetes mellitus, type 2 ([place])   . Diverticulosis   . Gout   . Hypertension   . Prostate cancer [facility])    Prostatic adenocarcinoma, Gleason score 7 (3+4), s/p radical prostatectomy 12/04/10 by Dr. [name].     Review of Systems:  Review of Systems  Constitutional: Negative for malaise/fatigue.  Genitourinary: Negative for frequency.  Neurological: Negative for dizziness.  Endo/Heme/Allergies: Negative for polydipsia.     Physical Exam:  Vitals:   03/30/17 1027  BP: 134/77  Pulse: 74  Temp: 98.2 F (36.8 C)  TempSrc: Oral  SpO2: 100%  Weight: 215 lb 12.8 oz (97.9 kg)   Physical Exam  Cardiovascular: Normal rate, regular rhythm and normal heart sounds.  Exam reveals no gallop and no friction rub.   No murmur heard. Pulmonary/Chest: Effort normal and breath sounds normal.  Musculoskeletal: He exhibits no edema.  Neurological:  Sensation intact in feet bilaterally   Skin: Skin is warm and dry.    Assessment & Plan:   See encounters tab for problem based medical decision making.   Patient discussed with Dr. Lynnae January

## 2017-03-30 NOTE — Assessment & Plan Note (Signed)
Assessment:  Type 2 diabetes mellitus Patient reports compliance with metformin ER 1000 mg twice a day. Today patient's hemoglobin A1c is 6.7.  Previous hemoglobin A1c was 6.7 on 10/2016. Patient denies polydipsia or polyuria.  He does not check his blood glucose at home.  Will continue with current regimen as patient's Type II diabetes is well controlled.    Plan - Hemoglobin A1C - Foot exam - follow up in 3 months

## 2017-04-11 ENCOUNTER — Other Ambulatory Visit: Payer: Self-pay | Admitting: Internal Medicine

## 2017-05-02 ENCOUNTER — Other Ambulatory Visit: Payer: Self-pay | Admitting: Internal Medicine

## 2017-05-02 DIAGNOSIS — E119 Type 2 diabetes mellitus without complications: Secondary | ICD-10-CM

## 2017-06-30 ENCOUNTER — Encounter: Payer: Self-pay | Admitting: Internal Medicine

## 2017-07-21 ENCOUNTER — Other Ambulatory Visit: Payer: Self-pay

## 2017-07-21 ENCOUNTER — Ambulatory Visit (INDEPENDENT_AMBULATORY_CARE_PROVIDER_SITE_OTHER): Payer: Medicare Other | Admitting: Internal Medicine

## 2017-07-21 ENCOUNTER — Encounter: Payer: Self-pay | Admitting: Internal Medicine

## 2017-07-21 VITALS — BP 121/80 | HR 79 | Temp 98.1°F | Ht 65.0 in | Wt 219.2 lb

## 2017-07-21 DIAGNOSIS — Z79899 Other long term (current) drug therapy: Secondary | ICD-10-CM | POA: Diagnosis not present

## 2017-07-21 DIAGNOSIS — Z7984 Long term (current) use of oral hypoglycemic drugs: Secondary | ICD-10-CM | POA: Diagnosis not present

## 2017-07-21 DIAGNOSIS — M109 Gout, unspecified: Secondary | ICD-10-CM | POA: Diagnosis not present

## 2017-07-21 DIAGNOSIS — H547 Unspecified visual loss: Secondary | ICD-10-CM

## 2017-07-21 DIAGNOSIS — I10 Essential (primary) hypertension: Secondary | ICD-10-CM | POA: Diagnosis not present

## 2017-07-21 DIAGNOSIS — C61 Malignant neoplasm of prostate: Secondary | ICD-10-CM

## 2017-07-21 DIAGNOSIS — Z8546 Personal history of malignant neoplasm of prostate: Secondary | ICD-10-CM | POA: Diagnosis not present

## 2017-07-21 DIAGNOSIS — Z23 Encounter for immunization: Secondary | ICD-10-CM

## 2017-07-21 DIAGNOSIS — Z9079 Acquired absence of other genital organ(s): Secondary | ICD-10-CM | POA: Diagnosis not present

## 2017-07-21 DIAGNOSIS — E119 Type 2 diabetes mellitus without complications: Secondary | ICD-10-CM | POA: Diagnosis not present

## 2017-07-21 DIAGNOSIS — Z0000 Encounter for general adult medical examination without abnormal findings: Secondary | ICD-10-CM

## 2017-07-21 DIAGNOSIS — Z973 Presence of spectacles and contact lenses: Secondary | ICD-10-CM

## 2017-07-21 LAB — POCT GLYCOSYLATED HEMOGLOBIN (HGB A1C): Hemoglobin A1C: 6.9

## 2017-07-21 LAB — GLUCOSE, CAPILLARY: GLUCOSE-CAPILLARY: 116 mg/dL — AB (ref 65–99)

## 2017-07-21 MED ORDER — HYDROCHLOROTHIAZIDE 12.5 MG PO CAPS: 12.5000 mg | ORAL_CAPSULE | Freq: Every day | ORAL | 2 refills | Status: DC

## 2017-07-21 MED ORDER — ALBUTEROL SULFATE HFA 108 (90 BASE) MCG/ACT IN AERS: INHALATION_SPRAY | RESPIRATORY_TRACT | 2 refills | Status: DC

## 2017-07-21 MED ORDER — ALLOPURINOL 100 MG PO TABS: 200.0000 mg | ORAL_TABLET | Freq: Every day | ORAL | 1 refills | Status: DC

## 2017-07-21 MED ORDER — ATENOLOL 25 MG PO TABS: 25.0000 mg | ORAL_TABLET | Freq: Every day | ORAL | 2 refills | Status: DC

## 2017-07-21 MED ORDER — LOSARTAN POTASSIUM 100 MG PO TABS: 100.0000 mg | ORAL_TABLET | Freq: Every day | ORAL | 2 refills | Status: DC

## 2017-07-21 MED ORDER — FLUTICASONE PROPIONATE 50 MCG/ACT NA SUSP: 1.0000 | Freq: Every day | NASAL | 2 refills | Status: DC | PRN

## 2017-07-21 MED ORDER — ATORVASTATIN CALCIUM 40 MG PO TABS: 40.0000 mg | ORAL_TABLET | Freq: Every day | ORAL | 2 refills | Status: DC

## 2017-07-21 MED ORDER — METFORMIN HCL ER 500 MG PO TB24: 1000.0000 mg | ORAL_TABLET | Freq: Two times a day (BID) | ORAL | 2 refills | Status: DC

## 2017-07-21 NOTE — Assessment & Plan Note (Addendum)
Last uric acid 10.6. Reports taking allopurinol 200 mg daily. No gout flares. Does report he occasionally misses doses.   A/P Uric acid still not at goal. Re-check uric acid today.   Uric Acid 10.7. Discussed with patient. Reports he does not take the allopurinol consisently. Encouraged taking it consistently to avoid flares.

## 2017-07-21 NOTE — Assessment & Plan Note (Signed)
Flu shot and TDap given today

## 2017-07-21 NOTE — Assessment & Plan Note (Addendum)
Will check annual PSA today.   PSA undetectable. Re-check annually.

## 2017-07-21 NOTE — Patient Instructions (Signed)
Austin Anderson,   Austin Anderson are doing great. I will let you know the results of your blood work from today.   Follow up with me in 6 months.

## 2017-07-21 NOTE — Assessment & Plan Note (Signed)
BP Readings from Last 3 Encounters:  07/21/17 121/80  03/30/17 134/77  01/04/17 (!) 143/88    Lab Results  Component Value Date   NA 137 10/15/2016   K 4.4 10/15/2016   CREATININE 1.43 (H) 10/15/2016   Currently on HCTZ 12.5 mg daily, Atenolol 25 mg daily, and losartan 100 mg daily. Reports compliance. BP well controlled today.   A/P: Continue current medications

## 2017-07-21 NOTE — Assessment & Plan Note (Signed)
Lab Results  Component Value Date   HGBA1C 6.9 07/21/2017   HGBA1C 6.7 03/30/2017   HGBA1C 6.7 10/15/2016    Last A1c 3 months ago was 6.9. Currently only on metformin ER 1000 mg bid. Denies any side effects. No polyuria or polydipsia. No episodes of hypoglycemia.   A/P: Continue current medications

## 2017-07-21 NOTE — Progress Notes (Signed)
   CC: HTN follow up  HPI:  [name] is a 66 y.o. male with a past medical history listed below here today for follow up of his HTN.   For details of today's visit and the status of his chronic medical issues please refer to the assessment and plan.   Past Medical History:  Diagnosis Date  . Asthma    Reported by patient's last physician before coming to [facility].  No PFT's on file.   . Diabetes mellitus, type 2 ([place])   . Diverticulosis   . Gout   . Hypertension   . Prostate cancer [facility])    Prostatic adenocarcinoma, Gleason score 7 (3+4), s/p radical prostatectomy 12/04/10 by Dr. [name].    Review of Systems:   No chest pain or shortness of breath  Physical Exam:  Vitals:   07/21/17 1503  BP: 121/80  Pulse: 79  Temp: 98.1 F (36.7 C)  TempSrc: Oral  SpO2: 97%  Weight: 219 lb 3.2 oz (99.4 kg)  Height: 5\' 5"  (1.651 m)   Physical Exam  Constitutional: He is oriented to person, place, and time and well-developed, well-nourished, and in no distress. No distress.  HENT:  Blind, wearing glasses  Cardiovascular: Normal rate, regular rhythm and normal heart sounds.  Pulmonary/Chest: Effort normal and breath sounds normal.  Abdominal: Soft. Bowel sounds are normal. There is no tenderness.  Neurological: He is alert and oriented to person, place, and time.  Skin: Skin is warm and dry.  Psychiatric: Mood and affect normal.     Assessment & Plan:   See Encounters Tab for problem based charting.  Patient discussed with Dr. Daryll Drown

## 2017-07-22 LAB — PSA

## 2017-07-22 LAB — URIC ACID: Uric Acid: 10.7 mg/dL — ABNORMAL HIGH (ref 3.7–8.6)

## 2017-07-22 NOTE — Progress Notes (Signed)
Internal Medicine Clinic Attending  Case discussed with Dr. Boswell at the time of the visit.  We reviewed the resident's history and exam and pertinent patient test results.  I agree with the assessment, diagnosis, and plan of care documented in the resident's note.  

## 2017-08-02 ENCOUNTER — Telehealth: Payer: Self-pay

## 2017-08-02 NOTE — Telephone Encounter (Signed)
Requesting lab results. Please call back.  

## 2017-10-13 ENCOUNTER — Ambulatory Visit (INDEPENDENT_AMBULATORY_CARE_PROVIDER_SITE_OTHER): Payer: Medicare Other | Admitting: Internal Medicine

## 2017-10-13 ENCOUNTER — Other Ambulatory Visit: Payer: Self-pay

## 2017-10-13 VITALS — BP 134/89 | HR 83 | Temp 98.3°F | Wt 219.7 lb

## 2017-10-13 DIAGNOSIS — R109 Unspecified abdominal pain: Secondary | ICD-10-CM | POA: Insufficient documentation

## 2017-10-13 DIAGNOSIS — K5900 Constipation, unspecified: Secondary | ICD-10-CM

## 2017-10-13 DIAGNOSIS — R1033 Periumbilical pain: Secondary | ICD-10-CM | POA: Diagnosis not present

## 2017-10-13 DIAGNOSIS — H409 Unspecified glaucoma: Secondary | ICD-10-CM | POA: Diagnosis not present

## 2017-10-13 DIAGNOSIS — Z8719 Personal history of other diseases of the digestive system: Secondary | ICD-10-CM

## 2017-10-13 DIAGNOSIS — K5909 Other constipation: Secondary | ICD-10-CM | POA: Diagnosis not present

## 2017-10-13 DIAGNOSIS — H547 Unspecified visual loss: Secondary | ICD-10-CM

## 2017-10-13 NOTE — Assessment & Plan Note (Signed)
Filled out paperwork for transportation assistance given his visual impairment due to glaucoma history

## 2017-10-13 NOTE — Progress Notes (Signed)
Internal Medicine Clinic Attending  Case discussed with Dr. Harden  at the time of the visit.  We reviewed the resident's history and exam and pertinent patient test results.  I agree with the assessment, diagnosis, and plan of care documented in the resident's note.  Alexander N Raines, MD   

## 2017-10-13 NOTE — Patient Instructions (Signed)
Nice to see you today Austin Anderson.  For your abdominal pain, this sounds like constipation rather than another infection of your colon.  Going forward it will be important to use a stool softener each day.  We recommend using the MiraLAX each day to make sure that you have a soft easy bowel movement daily.  You can also take a fiber supplement over-the-counter to take at the same time as the MiraLAX.  If your pain becomes more constant or you start to have fever, do not hesitate to call the clinic to make an appointment to have it reevaluated.

## 2017-10-13 NOTE — Assessment & Plan Note (Signed)
Patient has a history of diverticulosis with one episode of diverticulitis treated with antibiotics.  The description of his pattern of symptoms and duration is more consistent with chronic constipation rather than an acute infection.  He takes MiraLAX intermittently and Tylenol for relief.  He was advised that a regular bowel regimen may be more beneficial in preventing recurrence of his symptoms and can be titrated to having one soft, easy bowel movement per day.  Patient was resistant to using laxatives more frequently than currently concerned this would lead to severe diarrhea despite attempts to explain that a daily bowel regimen would improve his symptoms and likely keep his bowel movements to a reasonable number of 1-2/day.  After further discussion, adding a daily over-the-counter fiber supplement was made as a compromise and will hopefully help his symptoms without using antibiotics are not indicated.  --Daily fiber, miralax prn  --Return precautions

## 2017-10-13 NOTE — Progress Notes (Signed)
   CC: Abdominal pain   HPI:  [name] is a 67 y.o. male with past medical history as described below who presents to the clinic with complaints of abdominal pain.  He reports a several month history of intermittent abdominal pain around his umbilicus.  The pain lasts for 2-3 days and then resolves after using MiraLAX until recurring about a week or 2 later.  He denies fever, nausea vomiting diarrhea, weight loss.  He states the pain is similar to an episode he had a little over one year ago which resolved with antibiotic treatment.  He has been able to maintain normal appetite and intake.  He states he is currently not having any symptoms.  He reports having bowel movements around every other day and has straining with some bowel movements.  Last colonoscopy was 2016 and revealed only diverticulosis.  He also has paperwork for [employer] transportation assistance given his blindness due to glaucoma.  Past Medical History:  Diagnosis Date  . Asthma    Reported by patient's last physician before coming to [facility].  No PFT's on file.   . Diabetes mellitus, type 2 ([place])   . Diverticulosis   . Gout   . Hypertension   . Prostate cancer [facility])    Prostatic adenocarcinoma, Gleason score 7 (3+4), s/p radical prostatectomy 12/04/10 by Dr. [name].    Review of Systems:  Review of Systems  Constitutional: Negative for fever and weight loss.  Gastrointestinal: Positive for abdominal pain and constipation. Negative for diarrhea, nausea and vomiting.     Physical Exam:  Vitals:   10/13/17 1334  BP: 134/89  Pulse: 83  Temp: 98.3 F (36.8 C)  TempSrc: Oral  SpO2: 99%  Weight: 219 lb 11.2 oz (99.7 kg)   General: Sitting in chair comfortably, no acute distress CV: Regular rate and rhythm, no murmur appreciated Resp: Clear anterior breath sounds bilaterally, normal work of breathing, no distress  Abd: Soft, +BS, nondistended, no tenderness to palpation of abdominal quadrants or periumbilically, no  rebound, no guarding  Extr: Good peripheral pulses Neuro: Alert and oriented x3 Skin: Warm, dry      Assessment & Plan:   See Encounters Tab for problem based charting.  Patient discussed with Dr. Rebeca Alert

## 2017-11-25 ENCOUNTER — Telehealth: Payer: Self-pay

## 2017-11-25 NOTE — Telephone Encounter (Signed)
Needs to speak with a nurse about getting antibiotic for stomach pain. Please call pt back.

## 2017-11-25 NOTE — Telephone Encounter (Signed)
Pt states his stomach is continuing to bother him, states dr boswell told him in [employer] that if it continued he would order some abx. Pt is offered [facility] appt today but states he does not have transportation to bring him in until Tuesday at the earliest due to 3 day notice for transport.

## 2017-11-26 NOTE — Telephone Encounter (Signed)
I have not seen the patient for abdominal pain. Dr. Johny Chess saw him in January for similar complaints and did not believe it to be infectious at that point and did not mention anything about antibiotics. He needs to be seen in clinic for evaluation. If his pain is uncontrolled and he needs quicker follow up and cannot get to the clinic then he should call EMS and be evaluated in the ED.   Thanks.

## 2017-11-30 ENCOUNTER — Ambulatory Visit: Payer: Self-pay

## 2017-12-02 ENCOUNTER — Ambulatory Visit (INDEPENDENT_AMBULATORY_CARE_PROVIDER_SITE_OTHER): Payer: Medicare Other | Admitting: Internal Medicine

## 2017-12-02 ENCOUNTER — Encounter: Payer: Self-pay | Admitting: Internal Medicine

## 2017-12-02 ENCOUNTER — Other Ambulatory Visit: Payer: Self-pay

## 2017-12-02 ENCOUNTER — Ambulatory Visit (HOSPITAL_COMMUNITY)
Admission: RE | Admit: 2017-12-02 | Discharge: 2017-12-02 | Disposition: A | Discharge status: Home / Self Care | Payer: Medicare Other | Source: Ambulatory Visit | Attending: Internal Medicine | Admitting: Internal Medicine

## 2017-12-02 VITALS — BP 132/84 | HR 90 | Temp 98.2°F | Ht 65.0 in | Wt 218.5 lb

## 2017-12-02 DIAGNOSIS — Z9079 Acquired absence of other genital organ(s): Secondary | ICD-10-CM

## 2017-12-02 DIAGNOSIS — Z8719 Personal history of other diseases of the digestive system: Secondary | ICD-10-CM | POA: Diagnosis not present

## 2017-12-02 DIAGNOSIS — R1033 Periumbilical pain: Secondary | ICD-10-CM

## 2017-12-02 DIAGNOSIS — R197 Diarrhea, unspecified: Secondary | ICD-10-CM | POA: Diagnosis not present

## 2017-12-02 DIAGNOSIS — R1032 Left lower quadrant pain: Secondary | ICD-10-CM

## 2017-12-02 DIAGNOSIS — H547 Unspecified visual loss: Secondary | ICD-10-CM | POA: Diagnosis not present

## 2017-12-02 DIAGNOSIS — R1031 Right lower quadrant pain: Secondary | ICD-10-CM | POA: Diagnosis not present

## 2017-12-02 NOTE — Progress Notes (Signed)
   CC: Abdominal Pain  HPI:  [name] is a 67 y.o. M with PMHx listed below presenting for Abdominal Pain. Please see the A&P for the status of the patient's chronic medical problems.  Patient reports ongoing abdominal pain since the time of his last clinic visit in January. At that time he started taking fiber daily and Miralax as needed (almost everyday) and has had regular soft bowl movements. He stats his pain is a constant, dull, 6/76 periumbilical abdominal pain with some radiation to his bilateral lower quadrants. He states that 2-3 times per week he experiences episodes of sharp, 7-8/10 pain along the same distribution. He additionally states that this feels the same as the pain he was experiencing when he was found to have diverticulitis in 2017. He endorses some increase in the abdominal pain when he has bowl movements or urinates. He denies other dysuria, urinary frequency, fevers, constipation, bloody or dark stools (he is blind but he has an aide who has checked for him).  Past Medical History:  Diagnosis Date  . Asthma    Reported by patient's last physician before coming to [facility].  No PFT's on file.   . Diabetes mellitus, type 2 ([facility])   . Diverticulosis   . Gout   . Hypertension   . Prostate cancer [facility])    Prostatic adenocarcinoma, Gleason score 7 (3+4), s/p radical prostatectomy 12/04/10 by Dr. Alinda Anderson.    Review of Systems:  Performed and all others negative.  Physical Exam:  Vitals:   12/02/17 1400  BP: 132/84  Pulse: 90  Temp: 98.2 F (36.8 C)  TempSrc: Oral  SpO2: 98%  Weight: 218 lb 8 oz (99.1 kg)  Height: 5\' 5"  (1.651 m)   Physical Exam  Constitutional: He appears well-developed and well-nourished. No distress.  Cardiovascular: Normal rate, regular rhythm, normal heart sounds and intact distal pulses.  Pulmonary/Chest: Effort normal and breath sounds normal. No respiratory distress.  Abdominal: Soft. Bowel sounds are normal. He exhibits no distension.    Mild tenderness to palpation of LLQ, RLQ. Tenderness to palpation of suprapubic region.  Skin: Skin is warm and dry.    Assessment & Plan:   See Encounters Tab for problem based charting.  Patient discussed with Dr. Dareen Anderson

## 2017-12-02 NOTE — Assessment & Plan Note (Addendum)
Patient reports ongoing abdominal pain since the time of his last clinic visit in January. At that time he started taking fiber daily and Miralax as needed (almost everyday) and has had regular soft bowl movements. He stats his pain is a constant, dull, 6/71 periumbilical abdominal pain with some radiation to his bilateral lower quadrants. He states that 2-3 times per week he experiences episodes of sharp, 7-8/10 pain along the same distribution. He additionally states that this feels the same as the pain he was experiencing when he was found to have diverticulitis in 2017. He endorses some increase in the abdominal pain when he has bowl movements or urinates. He denies other dysuria, urinary frequency, fevers, constipation, bloody or dark stools (he is blind but he has an aide who has checked for him).  On exam, patient is non-toxic appearing and has tenderness to palpation inferior to umbilicus and mild tender to palpation of bilateral lower quadrant. No flank pain. Patient's presenting symptoms are not as severe as would be expected with ongoing diverticulitis. Patient could have a partial obstruction with softer stool pushing it's way around the obstruction. Will obtain abd films to evaluate stool burden and signs of obstruction. If this is negative patient will be treated with 7 day course of Ciprofloxacin and Metronidazole for possible mild diverticulitis. If this treatment is pursued and patient's symptoms persist, the plan would be to obtain a CT. Consideration was given to recurrence of patient's prostate cancer; however, he has no recorded weight loss and had a radical prostatectomy in 2012 with undetectable PSA in Nov 2018. - Abdominal Xray - If negative: >>Ciprofloxacin 500mg  BID x 7d >>Mtronidazolle 500mg  TID x 7d   ADDENDUM: Abdominal Xray normal, patient informed over the phone and above antibiotics ordered. - Ciprofloxacin 500mg  BID x 7d - Metronidazolle 500mg  TID x 7d  - Return precautions  given, if pain fails to improve patient will likely need CT scan

## 2017-12-02 NOTE — Patient Instructions (Addendum)
Thank you for allowing [place] to care for you  For your abdominal pain - We are obtaining an xray to rule out constipation/stool burden - If xray is negative we will send antibiotics to your pharmacy - We will contact with the results

## 2017-12-03 ENCOUNTER — Telehealth: Payer: Self-pay | Admitting: Internal Medicine

## 2017-12-03 DIAGNOSIS — R1033 Periumbilical pain: Secondary | ICD-10-CM

## 2017-12-03 MED ORDER — CIPROFLOXACIN HCL 500 MG PO TABS: 500.0000 mg | ORAL_TABLET | Freq: Two times a day (BID) | ORAL | 0 refills | Status: AC

## 2017-12-03 MED ORDER — METRONIDAZOLE 500 MG PO TABS: 500.0000 mg | ORAL_TABLET | Freq: Three times a day (TID) | ORAL | 0 refills | Status: AC

## 2017-12-03 NOTE — Progress Notes (Signed)
Internal Medicine Clinic Attending  Case discussed with Dr. Melvin  at the time of the visit.  We reviewed the resident's history and exam and pertinent patient test results.  I agree with the assessment, diagnosis, and plan of care documented in the resident's note.  

## 2017-12-03 NOTE — Telephone Encounter (Signed)
Patient called to inform him of his recent normal abdominal Xray. He was informed that with these results were are ordering antibiotics to his pharmacy to treat empirically for diverticulitis. Patient instructed to return to clinic if his pain fails to improve for further evaluation and CT scan.  Austin Grippe, DO IM PGY-1

## 2017-12-10 ENCOUNTER — Other Ambulatory Visit: Payer: Self-pay | Admitting: Internal Medicine

## 2017-12-10 DIAGNOSIS — R198 Other specified symptoms and signs involving the digestive system and abdomen: Secondary | ICD-10-CM

## 2017-12-21 ENCOUNTER — Other Ambulatory Visit: Payer: Self-pay

## 2017-12-21 NOTE — Telephone Encounter (Signed)
polyethylene glycol (MIRALAX / GLYCOLAX) packet, will not be covered by the insurance. Requesting to speak with a nurse about med.

## 2017-12-21 NOTE — Telephone Encounter (Signed)
Spoke to patient- I asked if he can get it over the counter but unable to afford, requesting another laxative to be ordered. Thank you!

## 2017-12-21 NOTE — Telephone Encounter (Signed)
If he cannot afford OTC miralax I am not sure what would be more affordable. Will forward to Dr. Maudie Mercury to see what we can get approved. Thanks.

## 2017-12-23 NOTE — Telephone Encounter (Signed)
Hi Dr Charlynn Grimes, would you like me to try docusate or docusate-sennosides? Please let me know. Thank you

## 2017-12-24 NOTE — Telephone Encounter (Signed)
We can try docusate-sennoside if it is affordable. Thanks.

## 2018-01-12 ENCOUNTER — Encounter: Payer: Self-pay | Admitting: *Deleted

## 2018-02-14 ENCOUNTER — Ambulatory Visit (INDEPENDENT_AMBULATORY_CARE_PROVIDER_SITE_OTHER): Payer: Medicare Other | Admitting: Internal Medicine

## 2018-02-14 ENCOUNTER — Other Ambulatory Visit: Payer: Self-pay

## 2018-02-14 VITALS — BP 173/94 | HR 77 | Temp 98.1°F | Ht 65.5 in | Wt 214.1 lb

## 2018-02-14 DIAGNOSIS — Z98818 Other dental procedure status: Secondary | ICD-10-CM

## 2018-02-14 DIAGNOSIS — E1122 Type 2 diabetes mellitus with diabetic chronic kidney disease: Secondary | ICD-10-CM | POA: Diagnosis not present

## 2018-02-14 DIAGNOSIS — M109 Gout, unspecified: Secondary | ICD-10-CM | POA: Diagnosis not present

## 2018-02-14 DIAGNOSIS — E1165 Type 2 diabetes mellitus with hyperglycemia: Secondary | ICD-10-CM | POA: Diagnosis not present

## 2018-02-14 DIAGNOSIS — I10 Essential (primary) hypertension: Secondary | ICD-10-CM

## 2018-02-14 DIAGNOSIS — Z79899 Other long term (current) drug therapy: Secondary | ICD-10-CM

## 2018-02-14 DIAGNOSIS — G4700 Insomnia, unspecified: Secondary | ICD-10-CM

## 2018-02-14 DIAGNOSIS — R1033 Periumbilical pain: Secondary | ICD-10-CM

## 2018-02-14 DIAGNOSIS — K5792 Diverticulitis of intestine, part unspecified, without perforation or abscess without bleeding: Secondary | ICD-10-CM | POA: Diagnosis not present

## 2018-02-14 DIAGNOSIS — Z8546 Personal history of malignant neoplasm of prostate: Secondary | ICD-10-CM

## 2018-02-14 DIAGNOSIS — N182 Chronic kidney disease, stage 2 (mild): Secondary | ICD-10-CM

## 2018-02-14 DIAGNOSIS — C61 Malignant neoplasm of prostate: Secondary | ICD-10-CM

## 2018-02-14 DIAGNOSIS — I129 Hypertensive chronic kidney disease with stage 1 through stage 4 chronic kidney disease, or unspecified chronic kidney disease: Secondary | ICD-10-CM | POA: Diagnosis not present

## 2018-02-14 DIAGNOSIS — Z7984 Long term (current) use of oral hypoglycemic drugs: Secondary | ICD-10-CM | POA: Diagnosis not present

## 2018-02-14 LAB — POCT GLYCOSYLATED HEMOGLOBIN (HGB A1C): Hemoglobin A1C: 6.8 % — AB (ref 4.0–5.6)

## 2018-02-14 LAB — GLUCOSE, CAPILLARY: Glucose-Capillary: 115 mg/dL — ABNORMAL HIGH (ref 65–99)

## 2018-02-14 MED ORDER — MELATONIN 5 MG PO TABS: 5.0000 mg | ORAL_TABLET | Freq: Every evening | ORAL | 0 refills | Status: DC | PRN

## 2018-02-14 NOTE — Assessment & Plan Note (Signed)
Patient will be due for annual PSA check in November.

## 2018-02-14 NOTE — Assessment & Plan Note (Addendum)
Lab Results  Component Value Date   HGBA1C 6.8 (A) 02/14/2018   HGBA1C 6.9 07/21/2017   HGBA1C 6.7 03/30/2017    A1c remains right at goal at 6.8. Patient is currently on metformin ER 1000 mg twice daily.  He has had some lower abdominal pain attributed to diverticulitis flares.  Denies any nausea vomiting abdominal pain today.   A/P: We will continue his metformin dosing.

## 2018-02-14 NOTE — Patient Instructions (Signed)
[name], I am sorry that you are still having the intermittent abdominal pain.  When you have a flu please call us and let us know and we will consult again for you.  I will work on getting your medication sent to you and in a blister pack.  Follow-up in 1 to 2 months to recheck your blood pressure.

## 2018-02-14 NOTE — Assessment & Plan Note (Addendum)
BP Readings from Last 3 Encounters:  02/14/18 (!) 173/94  12/02/17 132/84  10/13/17 134/89    Lab Results  Component Value Date   NA 137 10/15/2016   K 4.4 10/15/2016   CREATININE 1.43 (H) 10/15/2016   Blood pressure 165/100 on arrival.  He is currently prescribed losartan 100 mg daily, hydrochlorothiazide 12.5 mg daily, atenolol 25 mg daily.  He denies any symptoms today including no chest pain, shortness of breath, headaches.  After hearing his blood pressure he did have some concerns that he was taking his medication correctly and he reports that he has an aide at home that does help with his medications.  We did discuss options for blister packaging and he was agreeable to that.  A/P: We will continue his current medications and work with pharmacy on getting his medications and the blister pack form.  We will have him follow-up in the next 1 to 2 months to recheck his blood pressure once we can ensure he is taking his medications properly.

## 2018-02-14 NOTE — Assessment & Plan Note (Addendum)
Mr. Shugars was seen in January and again and March for complaints of abdominal pain.  There were some concerns that his pain may be secondary to constipation.  However, symptoms did not improve.  At March follow-up abdominal x-ray did not show any abnormalities.  He has a history of diverticulitis infection in 2017 and reported symptoms were similar to that presentation.  However he had no associated signs of infection.  He was treated with a seven-day course of Cipro and Flagyl.  Today, he does report that he had some improvement with the course of antibiotics but symptoms have since returned.  He has no symptoms today but does report continued intermittent lower abdominal pain.  Reports the pain is below his umbilicus and reports it is "in his colon."  He also notes that he also has pain radiating to his rectum with defecation during these flares.  He has not had any fevers, chills, nausea, vomiting.  He reports that he has not no change in his appetite however he does report eating less as he has become somewhat afraid of bowel movements due to the pain.  He reports that as long as he takes MiraLAX he has loose or semisolid stools.  When he does not take the MiraLAX he has constipation with hard stools.  He denies any urinary symptoms.  He does note that he recently had dental work done and was given a course of penicillin by his dentist and he has had improvement in his symptoms after taking the course of penicillin.  Postvoid residual was checked today with no urine and bladder.  Assessment and plan: Discussed options with patient and he is agreeable to antibiotics called into pharmacy as needed during these flares of presumed diverticulitis.  We did discuss if it became severe he may require surgery as we have CT evidence of diverticulosis disease is likely contributing to his symptoms.

## 2018-02-14 NOTE — Progress Notes (Signed)
   CC: Abdominal pain follow-up  HPI:  [name] is a 67 y.o. male with a past medical history listed below here today for follow up of his abdominal pain.   For details of today's visit and the status of his chronic medical issues please refer to the assessment and plan.  Past Medical History:  Diagnosis Date  . Asthma    Reported by patient's last physician before coming to [facility].  No PFT's on file.   . Diabetes mellitus, type 2 ([place])   . Diverticulosis   . Gout   . Hypertension   . Prostate cancer [facility])    Prostatic adenocarcinoma, Gleason score 7 (3+4), s/p radical prostatectomy 12/04/10 by Dr. [name].    Review of Systems:   No chest pain or shortness of breath  Physical Exam:  Vitals:   02/14/18 1313 02/14/18 1344  BP: (!) 165/100 (!) 173/94  Pulse: 79 77  Temp: 98.1 F (36.7 C)   TempSrc: Oral   SpO2: 100%   Weight: 214 lb 1.6 oz (97.1 kg)   Height: 5' 5.5" (1.664 m)    GENERAL- alert, co-operative, appears as stated age, not in any distress. HEENT- Atraumatic, normocephalic, PERRL, EOMI, oral mucosa appears moist CARDIAC- RRR, no murmurs, rubs or gallops. RESP- Moving equal volumes of air, and clear to auscultation bilaterally, no wheezes or crackles. ABDOMEN- Soft, nontender, bowel sounds present. NEURO- No obvious Cr N abnormality. EXTREMITIES- pulse 2+, symmetric, no pedal edema. SKIN- Warm, dry, No rash or lesion. PSYCH- Normal mood and affect, appropriate thought content and speech.   Assessment & Plan:   See Encounters Tab for problem based charting.  Patient discussed with Dr. Evette Doffing

## 2018-02-14 NOTE — Assessment & Plan Note (Signed)
Patient with creatinine of 1.43 and GFR 60 at last check 10/2016.  We will recheck labs today.

## 2018-02-14 NOTE — Assessment & Plan Note (Signed)
Patient called the clinic after his visit and spoke with the nursing staff.  He requested something for sleep.  No further details left with the nursing staff.  Will send in a prescription for low-dose melatonin.  Will need reassessment at follow-up visit.

## 2018-02-14 NOTE — Assessment & Plan Note (Addendum)
No gout flares.  Is currently taking allopurinol 200 mg daily.  Last uric acid 10.6 on 07/2017.  A/P Recheck uric acid today.  Uric Acid 9.6. Increased allopurinol to 300 mg daily.

## 2018-02-15 LAB — BMP8+ANION GAP
ANION GAP: 17 mmol/L (ref 10.0–18.0)
BUN/Creatinine Ratio: 12 (ref 10–24)
BUN: 16 mg/dL (ref 8–27)
CO2: 20 mmol/L (ref 20–29)
Calcium: 10 mg/dL (ref 8.6–10.2)
Chloride: 105 mmol/L (ref 96–106)
Creatinine, Ser: 1.32 mg/dL — ABNORMAL HIGH (ref 0.76–1.27)
GFR calc Af Amer: 65 mL/min/{1.73_m2} (ref >59)
GFR calc non Af Amer: 57 mL/min/{1.73_m2} — ABNORMAL LOW (ref >59)
Glucose: 115 mg/dL — ABNORMAL HIGH (ref 65–99)
Potassium: 4.3 mmol/L (ref 3.5–5.2)
Sodium: 142 mmol/L (ref 134–144)

## 2018-02-15 LAB — URIC ACID: URIC ACID: 9.6 mg/dL — AB (ref 3.7–8.6)

## 2018-02-15 NOTE — Progress Notes (Signed)
Internal Medicine Clinic Attending  Case discussed with Dr. Boswell at the time of the visit.  We reviewed the resident's history and exam and pertinent patient test results.  I agree with the assessment, diagnosis, and plan of care documented in the resident's note.  

## 2018-02-16 ENCOUNTER — Encounter: Payer: Self-pay | Admitting: Internal Medicine

## 2018-02-21 ENCOUNTER — Telehealth: Payer: Self-pay | Admitting: Internal Medicine

## 2018-02-21 DIAGNOSIS — M109 Gout, unspecified: Secondary | ICD-10-CM

## 2018-02-21 MED ORDER — ALLOPURINOL 100 MG PO TABS: 300.0000 mg | ORAL_TABLET | Freq: Every day | ORAL | 1 refills | Status: DC

## 2018-02-21 NOTE — Telephone Encounter (Signed)
PATIENT WANTS LAB RESULTS FROM LAST WEEK

## 2018-02-21 NOTE — Telephone Encounter (Signed)
Called and discussed lab results

## 2018-03-13 ENCOUNTER — Encounter: Payer: Self-pay | Admitting: *Deleted

## 2018-03-24 ENCOUNTER — Other Ambulatory Visit: Payer: Self-pay | Admitting: Internal Medicine

## 2018-03-24 DIAGNOSIS — G4700 Insomnia, unspecified: Secondary | ICD-10-CM

## 2018-04-04 ENCOUNTER — Other Ambulatory Visit: Payer: Self-pay | Admitting: Internal Medicine

## 2018-04-04 NOTE — Telephone Encounter (Signed)
Needs a refill on antibotic for a stomach infection @ [facility] [place] asked pharmacy no refills; pt contact# [phone]

## 2018-04-04 NOTE — Telephone Encounter (Signed)
Called pt, made appt for thurs 7/25 at [address] [facility]

## 2018-04-07 ENCOUNTER — Other Ambulatory Visit: Payer: Self-pay

## 2018-04-07 ENCOUNTER — Encounter: Payer: Self-pay | Admitting: Internal Medicine

## 2018-04-07 ENCOUNTER — Ambulatory Visit (INDEPENDENT_AMBULATORY_CARE_PROVIDER_SITE_OTHER): Payer: Medicare Other | Admitting: Internal Medicine

## 2018-04-07 VITALS — BP 168/74 | HR 58 | Temp 97.8°F | Ht 65.0 in | Wt 216.6 lb

## 2018-04-07 DIAGNOSIS — K5730 Diverticulosis of large intestine without perforation or abscess without bleeding: Secondary | ICD-10-CM

## 2018-04-07 DIAGNOSIS — Z7984 Long term (current) use of oral hypoglycemic drugs: Secondary | ICD-10-CM

## 2018-04-07 DIAGNOSIS — G8929 Other chronic pain: Secondary | ICD-10-CM | POA: Diagnosis not present

## 2018-04-07 DIAGNOSIS — Z8719 Personal history of other diseases of the digestive system: Secondary | ICD-10-CM

## 2018-04-07 DIAGNOSIS — J45909 Unspecified asthma, uncomplicated: Secondary | ICD-10-CM

## 2018-04-07 DIAGNOSIS — I10 Essential (primary) hypertension: Secondary | ICD-10-CM | POA: Diagnosis not present

## 2018-04-07 DIAGNOSIS — R1032 Left lower quadrant pain: Secondary | ICD-10-CM

## 2018-04-07 DIAGNOSIS — Z79899 Other long term (current) drug therapy: Secondary | ICD-10-CM

## 2018-04-07 DIAGNOSIS — M109 Gout, unspecified: Secondary | ICD-10-CM

## 2018-04-07 DIAGNOSIS — E119 Type 2 diabetes mellitus without complications: Secondary | ICD-10-CM

## 2018-04-07 DIAGNOSIS — H548 Legal blindness, as defined in USA: Secondary | ICD-10-CM

## 2018-04-07 MED ORDER — ALBUTEROL SULFATE HFA 108 (90 BASE) MCG/ACT IN AERS: INHALATION_SPRAY | RESPIRATORY_TRACT | 2 refills | Status: DC

## 2018-04-07 MED ORDER — HYDROCHLOROTHIAZIDE 12.5 MG PO CAPS: 12.5000 mg | ORAL_CAPSULE | Freq: Every day | ORAL | 2 refills | Status: DC

## 2018-04-07 MED ORDER — LOSARTAN POTASSIUM 100 MG PO TABS: 100.0000 mg | ORAL_TABLET | Freq: Every day | ORAL | 2 refills | Status: DC

## 2018-04-07 MED ORDER — METFORMIN HCL ER 500 MG PO TB24: 1000.0000 mg | ORAL_TABLET | Freq: Two times a day (BID) | ORAL | 2 refills | Status: DC

## 2018-04-07 MED ORDER — METRONIDAZOLE 500 MG PO TABS: 500.0000 mg | ORAL_TABLET | Freq: Three times a day (TID) | ORAL | 0 refills | Status: AC

## 2018-04-07 MED ORDER — ATENOLOL 25 MG PO TABS: 25.0000 mg | ORAL_TABLET | Freq: Every day | ORAL | 2 refills | Status: DC

## 2018-04-07 MED ORDER — CIPROFLOXACIN HCL 500 MG PO TABS: 500.0000 mg | ORAL_TABLET | Freq: Two times a day (BID) | ORAL | 0 refills | Status: AC

## 2018-04-07 NOTE — Assessment & Plan Note (Signed)
Vitals:   04/07/18 1311 04/07/18 1313  BP: (!) 143/87 (!) 168/74  Pulse: (!) 58 (!) 58  Temp: 97.8 F (36.6 C)   SpO2: 99%    - Patient with blood pressure above goal pressure - Currently on losartan 100mg  daily, HCTZ 12.5mg  daily, Atenolol 25 mg daily - Denies any chest pain, palpitations, shortness of breath  - Patient with high bp but expected as he is currently endorsing pain - Will re-assess anti-hypertensive regimen once acute diverticulitis is resolved - Refilled Losartan, HCTZ, Atenolol as requested

## 2018-04-07 NOTE — Assessment & Plan Note (Signed)
-   Patient requesting albuterol inhaler refill - Denies any episodes of shortness of breath - No history of PFT on record - States use once or twice a week - Will refill as requested

## 2018-04-07 NOTE — Progress Notes (Signed)
   CC: Abdominal Pain  HPI: [name] is a 67 y.o. M w/ PMH of blindness, diverticulosis, T2DM, Gout, HTN presenting to the clinic with complaint of chronic recurrent abdominal pain. Patient states he was diagnosed with diverticulitis in 2017 after CT abdomen and has had recurrent episodes of abdominal pain every 3~4 months. He states with every episode symptoms subside after a 7 day course of Cipro and Flagyl. He came to the clinic about a month ago for the same episode and was working on setting up a standing order with the pharmacy for these recurrent symptoms but never received his antibiotics and has been experiencing abdominal pain since. He describes the pain as 6/10 dull, aching pain localized under his hypogastric region. Pain worsens with food and defecation and patient endorses loss of appetite due to pain. Unable to describe stool color or texture due to blindness but states he has regular bowel movements as long as he takes his Miralax, which he takes with every meal. His regular diet consists of vegetables, beans, rice and fish. Denies any F/N/V.  Past Medical History:  Diagnosis Date  . Asthma    Reported by patient's last physician before coming to [facility].  No PFT's on file.   . Diabetes mellitus, type 2 ([place])   . Diverticulosis   . Gout   . Hypertension   . Prostate cancer [facility])    Prostatic adenocarcinoma, Gleason score 7 (3+4), s/p radical prostatectomy 12/04/10 by Dr. [name].    Review of Systems: Review of Systems  Constitutional: Negative for chills, fever and malaise/fatigue.  Cardiovascular: Negative for chest pain, palpitations and claudication.  Gastrointestinal: Positive for abdominal pain. Negative for constipation, diarrhea, heartburn, nausea and vomiting.  Neurological: Negative for dizziness, sensory change, weakness and headaches.     Physical Exam: Vitals:   04/07/18 1311 04/07/18 1313  BP: (!) 143/87 (!) 168/74  Pulse: (!) 58 (!) 58  Temp: 97.8 F  (36.6 C)   TempSrc: Oral   SpO2: 99%   Weight: 216 lb 9.6 oz (98.2 kg)   Height: 5\' 5"  (1.651 m)    Physical Exam  Constitutional: He is oriented to person, place, and time. He appears well-developed and well-nourished. No distress.  Cardiovascular: Normal rate, regular rhythm, normal heart sounds and intact distal pulses.  Respiratory: Effort normal and breath sounds normal. No respiratory distress. He has no wheezes.  GI: Soft. Bowel sounds are normal. He exhibits no distension. There is tenderness (tenderness to deep palpation in LLQ and hypogastric region). There is no rebound and no guarding.  Neurological: He is alert and oriented to person, place, and time. He has normal reflexes. No cranial nerve deficit.  Skin: Skin is warm and dry. No rash noted. No erythema.      Assessment & Plan:   See Encounters Tab for problem based charting.  Patient seen with Dr. Rebeca Alert   -Gilberto Better, PGY1

## 2018-04-07 NOTE — Assessment & Plan Note (Signed)
-   Patient with complaints of chronic lower abdominal pain - Multiple visits at the clinic for same complaints treated with Cipro and Flagyl - Colonoscopy in 2016 show diverticulosis in sigmoid and descending colon w/o hemorrhage - Abdominal CT in 2017 confirm diverticulitis - Abdominal X-ray in 11/2017 negative for fecal impaction or excessive stool  - Abdominal pain 2/2 recurrent diverticulitis vs constipation vs IBD - Prior history of relief with antibiotics and patient's regular bowel movements indicate recurrent diverticulitis most likely - Refer to GI for assessment and possible repeat colonoscopy after acute episode of diverticulitis is resolved - Start Cipro 500mg  BID and Flagyl 500mg  TID for 7 days

## 2018-04-07 NOTE — Assessment & Plan Note (Signed)
-   Patient requesting refills for his metformin ER 1000mg  BID - last A1c 6.8 on 02/14/18 - Patient cannot measure his blood glucose as he is blind - May benefit from blind-friendly audio-based glucometer in the future if his diabetes worsens - Refilled metformin as requested

## 2018-04-11 NOTE — Progress Notes (Signed)
Internal Medicine Clinic Attending  I saw and evaluated the patient.  I personally confirmed the key portions of the history and exam documented by Dr. Truman Hayward and I reviewed pertinent patient test results.  The assessment, diagnosis, and plan were formulated together and I agree with the documentation in the resident's note.  Here with recurrent abdominal pain. Likely due to diverticulitis, differential includes constipation and IBS. Although recent studies call into question the utility of antibiotics for routine uncomplicated diverticulitis, will prescribe 7 day course in this case as he has had persistent pain for more than a month. No systemic symptoms to suggest complication, abdominal exam reassuring. He reports stooling easily with miralax. Should be reevaluated with colonoscopy once current symptoms resolve.   Lenice Pressman, M.D., Ph.D.

## 2018-05-02 ENCOUNTER — Other Ambulatory Visit: Payer: Self-pay | Admitting: Internal Medicine

## 2018-05-02 DIAGNOSIS — E119 Type 2 diabetes mellitus without complications: Secondary | ICD-10-CM

## 2018-05-05 DIAGNOSIS — K5790 Diverticulosis of intestine, part unspecified, without perforation or abscess without bleeding: Secondary | ICD-10-CM | POA: Diagnosis not present

## 2018-06-01 ENCOUNTER — Other Ambulatory Visit: Payer: Self-pay | Admitting: Internal Medicine

## 2018-06-01 DIAGNOSIS — J45909 Unspecified asthma, uncomplicated: Secondary | ICD-10-CM

## 2018-08-08 ENCOUNTER — Other Ambulatory Visit: Payer: Self-pay

## 2018-08-08 NOTE — Patient Outreach (Signed)
[facility]) Care Management  08/08/2018  [facility] May 20, 1951 [id number]   Medication Adherence call to [facility] spoke with patient he is due on Metformin ER 500 mg, Atorvastatin 40 mg and Losartan 100 mg patient said he still has medication and will order in a week, he is no longer taking Atorvastatin 40 mg. [name] is showing past due under [name].   [place] Management Direct Dial [phone]  Fax [phone] [name]

## 2018-08-17 ENCOUNTER — Other Ambulatory Visit: Payer: Self-pay

## 2018-08-17 ENCOUNTER — Ambulatory Visit (INDEPENDENT_AMBULATORY_CARE_PROVIDER_SITE_OTHER): Payer: Medicare Other | Admitting: Internal Medicine

## 2018-08-17 ENCOUNTER — Encounter: Payer: Self-pay | Admitting: Internal Medicine

## 2018-08-17 VITALS — BP 112/71 | HR 60 | Temp 98.4°F | Ht 65.0 in | Wt 209.1 lb

## 2018-08-17 DIAGNOSIS — K5730 Diverticulosis of large intestine without perforation or abscess without bleeding: Secondary | ICD-10-CM | POA: Diagnosis not present

## 2018-08-17 DIAGNOSIS — Z23 Encounter for immunization: Secondary | ICD-10-CM

## 2018-08-17 DIAGNOSIS — I10 Essential (primary) hypertension: Secondary | ICD-10-CM

## 2018-08-17 DIAGNOSIS — G8929 Other chronic pain: Secondary | ICD-10-CM | POA: Diagnosis not present

## 2018-08-17 DIAGNOSIS — K5900 Constipation, unspecified: Secondary | ICD-10-CM

## 2018-08-17 DIAGNOSIS — H547 Unspecified visual loss: Secondary | ICD-10-CM

## 2018-08-17 DIAGNOSIS — Z7984 Long term (current) use of oral hypoglycemic drugs: Secondary | ICD-10-CM

## 2018-08-17 DIAGNOSIS — Z8546 Personal history of malignant neoplasm of prostate: Secondary | ICD-10-CM

## 2018-08-17 DIAGNOSIS — E119 Type 2 diabetes mellitus without complications: Secondary | ICD-10-CM

## 2018-08-17 DIAGNOSIS — Z0000 Encounter for general adult medical examination without abnormal findings: Secondary | ICD-10-CM

## 2018-08-17 DIAGNOSIS — R1033 Periumbilical pain: Secondary | ICD-10-CM | POA: Diagnosis not present

## 2018-08-17 DIAGNOSIS — C61 Malignant neoplasm of prostate: Secondary | ICD-10-CM

## 2018-08-17 DIAGNOSIS — M109 Gout, unspecified: Secondary | ICD-10-CM

## 2018-08-17 DIAGNOSIS — Z13220 Encounter for screening for lipoid disorders: Secondary | ICD-10-CM | POA: Diagnosis not present

## 2018-08-17 DIAGNOSIS — Z9079 Acquired absence of other genital organ(s): Secondary | ICD-10-CM

## 2018-08-17 LAB — POCT GLYCOSYLATED HEMOGLOBIN (HGB A1C): HEMOGLOBIN A1C: 7.1 % — AB (ref 4.0–5.6)

## 2018-08-17 LAB — GLUCOSE, CAPILLARY: Glucose-Capillary: 124 mg/dL — ABNORMAL HIGH (ref 70–99)

## 2018-08-17 MED ORDER — CIPROFLOXACIN HCL 500 MG PO TABS: 500.0000 mg | ORAL_TABLET | Freq: Two times a day (BID) | ORAL | 0 refills | Status: AC

## 2018-08-17 MED ORDER — METRONIDAZOLE 500 MG PO TABS: 500.0000 mg | ORAL_TABLET | Freq: Three times a day (TID) | ORAL | 0 refills | Status: AC

## 2018-08-17 NOTE — Patient Instructions (Addendum)
Mr. Concannon,  It was a pleasure meeting you today! I am sorry about your diverticulitis flare up. I have sent prescriptions for both Ciprofloxacin and Flagyl to your pharmacy. Please take Ciprofloxacin 500 mg twice a day and Flagyl 500 mg three times a day for a total of 7 days.  Please let us know if your symptoms recur or worsen. We will follow up in 6 months or sooner if symptoms recur or worsen.

## 2018-08-17 NOTE — Assessment & Plan Note (Addendum)
Patient complains of lower abdominal pain, mostly under his navel, and thinks he is having another diverticulitis flare up. This is his third visit this year with similar complaints that were treated with Cipro and Flagyl in the past.  Colonoscopy in 2016 showed diverticulosis in the sigmoid and descending colon and abdominal CT in 2017 confirmed diverticulitis. He said the pain is mostly in his lower abdominal region right under his navel.  He described the pain as a soreness.  He states he usually has a bowel movement every day and is sometimes constipated but uses MiraLAX regularly.  Denies any nausea or vomiting.  His abdominal pain is most likely due to diverticulitis versus constipation versus IBD.  He was recently seen by [facility] who did not recommend repeat colonoscopy at this time.  They also recommended that if the patient had recurrent pain in the same location could consider surgical evaluation.  For now we will treat this as a diverticulitis flareup and prescribe Cipro 500 mg twice daily and Flagyl 500 mg 3 times daily for 7 days.  Plan: -Cipro 500 mg twice daily and Flagyl 500 mg 3 times daily for 7 days

## 2018-08-17 NOTE — Assessment & Plan Note (Signed)
Checking PSA today.

## 2018-08-17 NOTE — Progress Notes (Signed)
   CC: Abdominal pain  HPI:  [name] is a 67 y.o.  w/ PMH of blindness, diverticulosis, T2DM, Gout, HTN presenting to the clinic with complaint of chronic recurrent abdominal pain.  For details of today's visit and the status of his chronic medical issues please refer to the assessment and plan.   Past Medical History:  Diagnosis Date  . Asthma    Reported by patient's last physician before coming to [facility].  No PFT's on file.   . Diabetes mellitus, type 2 ([place])   . Diverticulosis   . Gout   . Hypertension   . Prostate cancer [facility])    Prostatic adenocarcinoma, Gleason score 7 (3+4), s/p radical prostatectomy 12/04/10 by Dr. [name].    Review of Systems:   Review of Systems  Constitutional: Negative for chills, fever and weight loss.  Respiratory: Negative for cough and shortness of breath.   Cardiovascular: Negative for chest pain and leg swelling.  Gastrointestinal: Positive for abdominal pain and constipation. Negative for diarrhea, nausea and vomiting.  Genitourinary: Negative for dysuria and hematuria.    Physical Exam:  There were no vitals filed for this visit.  Physical Exam  Constitutional: He is oriented to person, place, and time and well-developed, well-nourished, and in no distress.  Cardiovascular: Normal rate, regular rhythm and normal heart sounds.  No murmur heard. Pulmonary/Chest: Effort normal and breath sounds normal. No respiratory distress. He has no wheezes. He has no rales.  Abdominal: Soft. Bowel sounds are normal. He exhibits no distension. There is tenderness. There is no rebound and no guarding.  Lower abdominal tenderness, periumbical region   Musculoskeletal: He exhibits no edema.  Neurological: He is alert and oriented to person, place, and time.  Skin: Skin is warm and dry.  Psychiatric: Mood, memory, affect and judgment normal.    Assessment & Plan:   See Encounters Tab for problem based charting.  Patient seen with Dr.  Lynnae January

## 2018-08-17 NOTE — Assessment & Plan Note (Signed)
Patient's hemoglobin A1c is 7.1 today.  Continue metformin 500 mg twice daily.

## 2018-08-18 LAB — LIPID PANEL
Chol/HDL Ratio: 2.3 ratio (ref 0.0–5.0)
Cholesterol, Total: 137 mg/dL (ref 100–199)
HDL: 59 mg/dL
LDL Calculated: 65 mg/dL (ref 0–99)
Triglycerides: 63 mg/dL (ref 0–149)
VLDL Cholesterol Cal: 13 mg/dL (ref 5–40)

## 2018-08-18 LAB — PSA
Prostate Specific Ag, Serum: <0.1 ng/mL (ref 0.0–4.0)
Prostate Specific Ag, Serum: <0.1 ng/mL (ref 0.0–4.0)

## 2018-08-19 NOTE — Progress Notes (Signed)
Internal Medicine Clinic Attending  I saw and evaluated the patient.  I personally confirmed the key portions of the history and exam documented by Dr.  Rehman  and I reviewed pertinent patient test results.  The assessment, diagnosis, and plan were formulated together and I agree with the documentation in the resident's note.  

## 2018-08-24 ENCOUNTER — Telehealth: Payer: Self-pay | Admitting: *Deleted

## 2018-08-24 NOTE — Telephone Encounter (Signed)
If patient is feeling better it is okay to stop these medications at this point.  Thanks

## 2018-08-24 NOTE — Telephone Encounter (Signed)
Call from pt - stated he had 2 episodes of vomiting yesterday; stated the taste of medicine. He was started on Cipro and Flagyl last Thursday. He believes they are too strong for him - stated he had taken them before in the past but only had some nausea. He did not take of them yesterday and none today so far. Stated he feels better. Told him I will send this message to his doctor thern let him know what she recommends.

## 2018-08-24 NOTE — Telephone Encounter (Signed)
Called pt - informed to stop medications if feeling better. Stated he does feel better. Voiced understanding.

## 2018-09-05 ENCOUNTER — Other Ambulatory Visit: Payer: Self-pay | Admitting: Internal Medicine

## 2018-09-05 DIAGNOSIS — M109 Gout, unspecified: Secondary | ICD-10-CM

## 2018-09-06 ENCOUNTER — Other Ambulatory Visit: Payer: Self-pay | Admitting: Internal Medicine

## 2018-09-06 DIAGNOSIS — M109 Gout, unspecified: Secondary | ICD-10-CM

## 2018-12-07 ENCOUNTER — Encounter: Payer: Self-pay | Admitting: Internal Medicine

## 2019-01-05 ENCOUNTER — Other Ambulatory Visit: Payer: Self-pay | Admitting: Internal Medicine

## 2019-01-05 DIAGNOSIS — J45909 Unspecified asthma, uncomplicated: Secondary | ICD-10-CM

## 2019-01-05 DIAGNOSIS — I10 Essential (primary) hypertension: Secondary | ICD-10-CM

## 2019-02-09 ENCOUNTER — Other Ambulatory Visit: Payer: Self-pay | Admitting: Internal Medicine

## 2019-02-09 DIAGNOSIS — E119 Type 2 diabetes mellitus without complications: Secondary | ICD-10-CM

## 2019-02-22 ENCOUNTER — Telehealth: Payer: Self-pay | Admitting: *Deleted

## 2019-02-22 NOTE — Telephone Encounter (Addendum)
Received fax from [facility] stating patient has been receiving refills on albuterol without filling a controller med. Requesting Rx for controller if appropriate. Have placed fax in PCP's box for consideration. Hubbard Hartshorn, RN, BSN

## 2019-03-01 ENCOUNTER — Other Ambulatory Visit: Payer: Self-pay

## 2019-03-01 ENCOUNTER — Ambulatory Visit (INDEPENDENT_AMBULATORY_CARE_PROVIDER_SITE_OTHER): Payer: Medicare Other | Admitting: Internal Medicine

## 2019-03-01 ENCOUNTER — Encounter: Payer: Self-pay | Admitting: Internal Medicine

## 2019-03-01 VITALS — BP 119/76 | HR 66 | Temp 99.1°F | Ht 65.0 in | Wt 213.3 lb

## 2019-03-01 DIAGNOSIS — E119 Type 2 diabetes mellitus without complications: Secondary | ICD-10-CM

## 2019-03-01 DIAGNOSIS — Z7984 Long term (current) use of oral hypoglycemic drugs: Secondary | ICD-10-CM

## 2019-03-01 DIAGNOSIS — Z23 Encounter for immunization: Secondary | ICD-10-CM

## 2019-03-01 DIAGNOSIS — K5730 Diverticulosis of large intestine without perforation or abscess without bleeding: Secondary | ICD-10-CM

## 2019-03-01 DIAGNOSIS — K5790 Diverticulosis of intestine, part unspecified, without perforation or abscess without bleeding: Secondary | ICD-10-CM | POA: Insufficient documentation

## 2019-03-01 HISTORY — DX: Diverticulosis of intestine, part unspecified, without perforation or abscess without bleeding: K57.90

## 2019-03-01 LAB — POCT GLYCOSYLATED HEMOGLOBIN (HGB A1C): Hemoglobin A1C: 7.3 % — AB (ref 4.0–5.6)

## 2019-03-01 LAB — GLUCOSE, CAPILLARY: Glucose-Capillary: 141 mg/dL — ABNORMAL HIGH (ref 70–99)

## 2019-03-01 NOTE — Assessment & Plan Note (Addendum)
Hgb A1c 7.3 today. Continue metformin 1000 mg BID.

## 2019-03-01 NOTE — Assessment & Plan Note (Signed)
Patient has a history of right sided colon, descending and sigmoid colon diverticulosis. Today he complains of a "hotness" in his stomach. Described as a fever in his stomach. He denies any fevers abdominal pain, nausea, vomiting, blood in stool. He states someone comes by to help check on him and they checked his stool and did not notice any blood or dark colored stool. He states this has been going on for many years. Last colonoscopy was in 2016 and was normal. Discussed this is most likely due to his diverticulosis and to return to clinic if any symptoms persist or worsen.   Plan: - Return to clinic if symptoms persist or worsen

## 2019-03-01 NOTE — Patient Instructions (Signed)
Austin Anderson,  It was a pleasure seeing you today. We discussed your diabetes, your stomach "hotness" and you had your pneumonia vaccine.   If your stomach hotness worsens, if you notice fever, worsening abdominal pain, nausea or vomiting or blood in your stool I want you to call the clinic and/or go to the ED.  We will plan to follow up in 3-6 months! Wish you the best,  Dr. Laural Golden

## 2019-03-01 NOTE — Progress Notes (Signed)
   CC: Diabetes   HPI:  [name] is a 68 y.o. with a PMHx listed below presenting to the [facility] clinic for follow up for his diabetes. For details of today's visit and the status of his chronic medical issues please refer to the assessment and plan.   Past Medical History:  Diagnosis Date  . Asthma    Reported by patient's last physician before coming to [facility].  No PFT's on file.   . Diabetes mellitus, type 2 ([place])   . Diverticulosis   . Gout   . Hypertension   . Prostate cancer [facility])    Prostatic adenocarcinoma, Gleason score 7 (3+4), s/p radical prostatectomy 12/04/10 by Dr. [name].    Review of Systems:   ROS   Physical Exam:  There were no vitals filed for this visit.  Physical Exam  Assessment & Plan:   See Encounters Tab for problem based charting.  Patient discussed with Dr. Angelia Mould

## 2019-03-06 NOTE — Progress Notes (Signed)
Internal Medicine Clinic Attending  Case discussed with Dr. Rehman at the time of the visit.  We reviewed the resident's history and exam and pertinent patient test results.  I agree with the assessment, diagnosis, and plan of care documented in the resident's note.  

## 2019-03-12 ENCOUNTER — Encounter: Payer: Self-pay | Admitting: *Deleted

## 2019-03-16 ENCOUNTER — Telehealth: Payer: Self-pay | Admitting: *Deleted

## 2019-03-16 MED ORDER — AMOXICILLIN-POT CLAVULANATE 875-125 MG PO TABS: 1.0000 | ORAL_TABLET | Freq: Three times a day (TID) | ORAL | 0 refills | Status: AC

## 2019-03-16 NOTE — Telephone Encounter (Signed)
I spoke with the patient this afternoon. He had an initial improvement after the 6/17 visit, but now had a worsening of lower left quadrant abdominal pain. The pain is moderate, not function limiting, no fevers, he is eating and drinking well. He has known diverticular disease, last CT 2017 had diverticulitis. He has used antibiotics about 4 times in the last few years. Plan is to treat with augmentin 875mg  q8 hours for 5 days. Gave return to clinic precautions if he has worsening. I sent prescription to his pharmacy.

## 2019-03-16 NOTE — Telephone Encounter (Signed)
Pt states when he saw dr Laural Golden 6/17 she told him if he needed abx just to call and request them for his diverticulosis, he states he has abd pain, denies N&V, diarrhea, blood in stool. States pain is worse since then. Please advise

## 2019-03-29 ENCOUNTER — Other Ambulatory Visit: Payer: Self-pay

## 2019-03-29 NOTE — Patient Outreach (Signed)
[facility]) Care Management  03/29/2019  CHOICE KLEINSASSER 1951/08/31 [id number]   Medication Adherence call to Mr. Jewelz Kobus [facility] Ident[facility] Verify spoke with patient he is past due on Losartan 100 mg patient explain he is taking 1 tablet daily and has medication at this time. Mr. Mehringer is showing past due under [place].   [facility] Management Direct Dial [phone]  Fax [phone] [email or web address]

## 2019-04-12 ENCOUNTER — Other Ambulatory Visit: Payer: Self-pay

## 2019-04-12 NOTE — Telephone Encounter (Signed)
Called pt back, discussed dr vincent's advice, [facility] 7/31 at 1015

## 2019-04-12 NOTE — Telephone Encounter (Signed)
I sent an rx of antibiotics on 7/2 based on his symptoms. If he is still having symptoms of diverticulitis, I think he will need to be evaluated in office for an exam, as something else may be at play and he may benefit from imaging.

## 2019-04-12 NOTE — Telephone Encounter (Signed)
Requesting antibiotic to be filled for diverticulosis. Please call pt back.

## 2019-04-14 ENCOUNTER — Encounter: Payer: Self-pay | Admitting: Internal Medicine

## 2019-04-14 ENCOUNTER — Ambulatory Visit (INDEPENDENT_AMBULATORY_CARE_PROVIDER_SITE_OTHER): Payer: Medicare Other | Admitting: Internal Medicine

## 2019-04-14 ENCOUNTER — Encounter (INDEPENDENT_AMBULATORY_CARE_PROVIDER_SITE_OTHER): Payer: Self-pay

## 2019-04-14 ENCOUNTER — Ambulatory Visit (HOSPITAL_COMMUNITY)
Admission: RE | Admit: 2019-04-14 | Discharge: 2019-04-14 | Disposition: A | Discharge status: Home / Self Care | Payer: Medicare Other | Source: Ambulatory Visit | Attending: Internal Medicine | Admitting: Internal Medicine

## 2019-04-14 ENCOUNTER — Other Ambulatory Visit: Payer: Self-pay

## 2019-04-14 DIAGNOSIS — R1030 Lower abdominal pain, unspecified: Secondary | ICD-10-CM

## 2019-04-14 DIAGNOSIS — Z792 Long term (current) use of antibiotics: Secondary | ICD-10-CM | POA: Diagnosis not present

## 2019-04-14 DIAGNOSIS — K5730 Diverticulosis of large intestine without perforation or abscess without bleeding: Secondary | ICD-10-CM | POA: Diagnosis not present

## 2019-04-14 DIAGNOSIS — K5790 Diverticulosis of intestine, part unspecified, without perforation or abscess without bleeding: Secondary | ICD-10-CM

## 2019-04-14 LAB — COMPREHENSIVE METABOLIC PANEL
ALT: 20 U/L (ref 0–44)
AST: 20 U/L (ref 15–41)
Albumin: 3.7 g/dL (ref 3.5–5.0)
Alkaline Phosphatase: 52 U/L (ref 38–126)
Anion gap: 14 (ref 5–15)
BUN: 18 mg/dL (ref 8–23)
CO2: 25 mmol/L (ref 22–32)
Calcium: 9.9 mg/dL (ref 8.9–10.3)
Chloride: 102 mmol/L (ref 98–111)
Creatinine, Ser: 1.74 mg/dL — ABNORMAL HIGH (ref 0.61–1.24)
GFR calc Af Amer: 46 mL/min — ABNORMAL LOW (ref >60)
GFR calc non Af Amer: 40 mL/min — ABNORMAL LOW (ref >60)
Glucose, Bld: 136 mg/dL — ABNORMAL HIGH (ref 70–99)
Potassium: 4 mmol/L (ref 3.5–5.1)
Sodium: 141 mmol/L (ref 135–145)
Total Bilirubin: 0.3 mg/dL (ref 0.3–1.2)
Total Protein: 8 g/dL (ref 6.5–8.1)

## 2019-04-14 LAB — CBC WITH DIFFERENTIAL/PLATELET
Abs Immature Granulocytes: 0.02 10*3/uL (ref 0.00–0.07)
Basophils Absolute: 0 10*3/uL (ref 0.0–0.1)
Basophils Relative: 1 %
Eosinophils Absolute: 0.1 10*3/uL (ref 0.0–0.5)
Eosinophils Relative: 2 %
HCT: 40.4 % (ref 39.0–52.0)
Hemoglobin: 12.9 g/dL — ABNORMAL LOW (ref 13.0–17.0)
Immature Granulocytes: 0 %
Lymphocytes Relative: 31 %
Lymphs Abs: 2.3 10*3/uL (ref 0.7–4.0)
MCH: 28.8 pg (ref 26.0–34.0)
MCHC: 31.9 g/dL (ref 30.0–36.0)
MCV: 90.2 fL (ref 80.0–100.0)
Monocytes Absolute: 0.6 10*3/uL (ref 0.1–1.0)
Monocytes Relative: 8 %
Neutro Abs: 4.4 10*3/uL (ref 1.7–7.7)
Neutrophils Relative %: 58 %
Platelets: 249 10*3/uL (ref 150–400)
RBC: 4.48 MIL/uL (ref 4.22–5.81)
RDW: 15.6 % — ABNORMAL HIGH (ref 11.5–15.5)
WBC: 7.5 10*3/uL (ref 4.0–10.5)
nRBC: 0 % (ref 0.0–0.2)

## 2019-04-14 LAB — URINALYSIS, ROUTINE W REFLEX MICROSCOPIC
Bacteria, UA: NONE SEEN
Bilirubin Urine: NEGATIVE
Glucose, UA: NEGATIVE mg/dL
Hgb urine dipstick: NEGATIVE
Ketones, ur: NEGATIVE mg/dL
Leukocytes,Ua: NEGATIVE
Nitrite: NEGATIVE
Protein, ur: 30 mg/dL — AB
Specific Gravity, Urine: 1.03 (ref 1.005–1.030)
pH: 5 (ref 5.0–8.0)

## 2019-04-14 LAB — C-REACTIVE PROTEIN: CRP: 10.1 mg/dL — ABNORMAL HIGH (ref <1.0)

## 2019-04-14 MED ORDER — CIPROFLOXACIN HCL 500 MG PO TABS: 500.0000 mg | ORAL_TABLET | Freq: Two times a day (BID) | ORAL | 0 refills | Status: AC

## 2019-04-14 MED ORDER — METRONIDAZOLE 500 MG PO TABS: 500.0000 mg | ORAL_TABLET | Freq: Three times a day (TID) | ORAL | 0 refills | Status: AC

## 2019-04-14 MED ORDER — IOHEXOL 300 MG/ML  SOLN
80.0000 mL | Freq: Once | INTRAMUSCULAR | Status: AC | PRN
  Administered 2019-04-14: 15:00:00 80 mL via INTRAVENOUS

## 2019-04-14 NOTE — Assessment & Plan Note (Addendum)
ADDENDUM:  Elevated CRP, WBC wnl. CT Abdomen findings as below consistent with diverticulitis. Will continue on Flagyl and Ciprofloxacin for total of 5 day course. Will consider referral for surgery due to recurrent bouts of diverticulitis.  CT ABDOMEN W/CONTRAST FINDINGS: 1.Positive for Acute Diverticulitis/Colitis of the left colon just distal to the splenic flexure. No associated abscess or complicating features. 2. No other acute or inflammatory process identified in the abdomen or pelvis. Frequent large bowel diverticulosis. Partially pelvic right kidney (normal variant).   Patient with history of diverticulosis of right colon, descending and sigmoid colon presenting to clinic with lower abdominal pain that feels like "soreness", specifically below the navel. He reports having daily bowel movements. He reports use of laxatives resulting in multiple soft stools per day. Unable to quantify BM's in a day. Patient reports nausea but denies any fevers, chills, vomiting, melena or hematochezia. He denies any urinary symptoms.  Of note, patient has been evaluated multiple times in the past year for flares of diverticulitis and prescribed Ciprofloxacin 500mg  bid and Flagyl 500mg  tid. Patient reports that this has worked in the past for him. He recently called on 03/16/2019 with complaints of abdominal pain and was prescribed Augmentin 875mg  q8h for 5 days. Patient reports that the Augmentin provided relief but he started experiencing pain again 3 days after completion of the Augmentin.  On examination, tenderness to palpation present in periumbilical region and LLQ. Bowel sounds normoactive. No guarding noted on examination.   - CT Abdomen w/contrast (Last CT was 2017 that showed diverticulitis)  - CBC, CRP, U/A, CMP - Start ciprofloxacin 500mg  bid and flagyl 500mg  tid for 5 days

## 2019-04-14 NOTE — Patient Instructions (Addendum)
Mr. Coia,  It was a pleasure seeing you in clinic today. Today, we discussed your continued lower abdominal pain.   We will get some blood work and CT scan to look for the source of your pain and if there is an infection, we are giving you Ciprofloxacin and Metronidazole. Please take Ciprofloxacin 500mg  twice a day and Metronidazole 500mg  three times a day.  Please contact us if you have any concerns or are not getting any better.  Thank you!

## 2019-04-14 NOTE — Progress Notes (Signed)
   CC: lower abdominal pain   HPI:  Austin Anderson is a 68 y.o.  Male with PMHx as listed below presenting to clinic for lower abdominal pain. Patient has previously been seen for this. Please see problem based charting for assessment and plan.   Past Medical History:  Diagnosis Date  . Asthma    Reported by patient's last physician before coming to [facility].  No PFT's on file.   . Diabetes mellitus, type 2 ([place])   . Diverticulosis   . Gout   . Hypertension   . Prostate cancer [facility])    Prostatic adenocarcinoma, Gleason score 7 (3+4), s/p radical prostatectomy 12/04/10 by Dr. [name].    Review of Systems:  Review of Systems  Constitutional: Negative for chills, fever and malaise/fatigue.  Respiratory: Negative for cough, shortness of breath and wheezing.   Cardiovascular: Negative for chest pain and palpitations.  Gastrointestinal: Positive for abdominal pain and nausea. Negative for blood in stool, constipation, diarrhea, melena and vomiting.  Genitourinary: Negative for dysuria, flank pain, frequency, hematuria and urgency.  Musculoskeletal: Negative for back pain, falls, joint pain and myalgias.  Neurological: Negative for dizziness, tingling, sensory change, focal weakness, weakness and headaches.     Physical Exam:  Vitals:   04/14/19 1026  BP: 136/84  Pulse: 72  Temp: 98.8 F (37.1 C)  TempSrc: Oral  SpO2: 98%  Weight: 201 lb 9.6 oz (91.4 kg)  Height: 5\' 5"  (1.651 m)   Physical Exam  Constitutional: He is well-developed, well-nourished, and in no distress.  Cardiovascular: Normal rate, regular rhythm, normal heart sounds and intact distal pulses. Exam reveals no gallop and no friction rub.  No murmur heard. Pulmonary/Chest: Effort normal and breath sounds normal. No respiratory distress. He has no wheezes. He has no rales.  Abdominal: Soft. Bowel sounds are normal. He exhibits no distension and no mass. There is abdominal tenderness in the periumbilical area and left  lower quadrant. There is no rigidity and no guarding.  Musculoskeletal: Normal range of motion.     Assessment & Plan:   See Encounters Tab for problem based charting.  Patient seen with Dr. Angelia Mould

## 2019-04-17 NOTE — Progress Notes (Signed)
Internal Medicine Clinic Attending  I saw and evaluated the patient.  I personally confirmed the key portions of the history and exam documented by Dr. Aslam and I reviewed pertinent patient test results.  The assessment, diagnosis, and plan were formulated together and I agree with the documentation in the resident's note.     

## 2019-07-19 ENCOUNTER — Encounter: Payer: Medicare Other | Admitting: Internal Medicine

## 2019-07-26 ENCOUNTER — Ambulatory Visit (INDEPENDENT_AMBULATORY_CARE_PROVIDER_SITE_OTHER): Payer: Medicare Other | Admitting: Internal Medicine

## 2019-07-26 ENCOUNTER — Encounter: Payer: Self-pay | Admitting: Internal Medicine

## 2019-07-26 ENCOUNTER — Other Ambulatory Visit: Payer: Self-pay

## 2019-07-26 VITALS — BP 104/65 | HR 64 | Temp 98.8°F | Ht 65.0 in | Wt 201.8 lb

## 2019-07-26 DIAGNOSIS — R1031 Right lower quadrant pain: Secondary | ICD-10-CM

## 2019-07-26 DIAGNOSIS — Z7984 Long term (current) use of oral hypoglycemic drugs: Secondary | ICD-10-CM

## 2019-07-26 DIAGNOSIS — R1033 Periumbilical pain: Secondary | ICD-10-CM | POA: Diagnosis not present

## 2019-07-26 DIAGNOSIS — I10 Essential (primary) hypertension: Secondary | ICD-10-CM | POA: Diagnosis not present

## 2019-07-26 DIAGNOSIS — Z79899 Other long term (current) drug therapy: Secondary | ICD-10-CM

## 2019-07-26 DIAGNOSIS — E119 Type 2 diabetes mellitus without complications: Secondary | ICD-10-CM

## 2019-07-26 DIAGNOSIS — C61 Malignant neoplasm of prostate: Secondary | ICD-10-CM

## 2019-07-26 DIAGNOSIS — Z8719 Personal history of other diseases of the digestive system: Secondary | ICD-10-CM

## 2019-07-26 DIAGNOSIS — Z23 Encounter for immunization: Secondary | ICD-10-CM

## 2019-07-26 DIAGNOSIS — K5790 Diverticulosis of intestine, part unspecified, without perforation or abscess without bleeding: Secondary | ICD-10-CM

## 2019-07-26 LAB — POCT GLYCOSYLATED HEMOGLOBIN (HGB A1C): Hemoglobin A1C: 6.4 % — AB (ref 4.0–5.6)

## 2019-07-26 LAB — GLUCOSE, CAPILLARY: Glucose-Capillary: 124 mg/dL — ABNORMAL HIGH (ref 70–99)

## 2019-07-26 MED ORDER — CIPROFLOXACIN HCL 500 MG PO TABS: 500.0000 mg | ORAL_TABLET | Freq: Two times a day (BID) | ORAL | 0 refills | Status: AC

## 2019-07-26 MED ORDER — ONDANSETRON HCL 4 MG PO TABS: 4.0000 mg | ORAL_TABLET | Freq: Two times a day (BID) | ORAL | 0 refills | Status: AC

## 2019-07-26 MED ORDER — METRONIDAZOLE 500 MG PO TABS: 500.0000 mg | ORAL_TABLET | Freq: Three times a day (TID) | ORAL | 0 refills | Status: AC

## 2019-07-26 NOTE — Assessment & Plan Note (Signed)
Hgb A1c 6.4.  -Continue Metformin 1000 mg BID

## 2019-07-26 NOTE — Assessment & Plan Note (Signed)
Check PSA today. 

## 2019-07-26 NOTE — Assessment & Plan Note (Signed)
Patient with multiple presentation of pain under his navel , CT Abdomen confirmed diverticulosis, and treated for diverticulitis multiple times. Presents with same pain today and has experienced this for 2 weeks. Mild pain reported compared to when he presented in July.  Assessment: Likely diverticulitis , will workup with CT to confirm. Also checking labs. Likely will need referral to surgery for evaluation with repeated presentation. Patient seen by [facility] in 2019, recommendation to refer to surgery and didn't believe repeat colonoscopy was warranted.  Plan: -CT abdomen with contrast -CMP -CBC -UA - Flagyl 500 mg BID and Cipro 500 mg TID  X 7 days - Zofran PRN for nausea - history of nausea with Abx

## 2019-07-26 NOTE — Patient Instructions (Signed)
Mr.Kabler  Thank you for trusting me with your care. To recap, today we discussed the following:   #Abdominal Pain - CT abdomen , you will be contacted to schedule - Start Antibiotics, Metronidazole and Ciprofloxacin  - I prescribe Zofran for nausea sometimes experienced with Metronidazole   I will also get lab work today and follow up with results.   Pleasure meeting you . My best, Tamsen Snider, MD

## 2019-07-26 NOTE — Assessment & Plan Note (Signed)
Patient BP 104/65 today. Reports changing his diet recently to lose weight. Pt names his BP meds and reports his nurse arranges them in bottles for him to take. Denies any symptoms of hypotension.   Assessment: well controlled hypertension  Plan:  - continue losartan 100 mg dailey, HCTZ 12.5 mg daily, and Atenolol 25 mg daily

## 2019-07-26 NOTE — Progress Notes (Signed)
   CC: Abdominal pain  HPI:Austin Anderson is a 68 y.o. with past medical history below who presents with abdominal pain. Please see problem based charting for details of presentation, assessment, and plan.   Past Medical History:  Diagnosis Date  . Asthma    Reported by patient's last physician before coming to [facility]  No PFT's on file.   . Diabetes mellitus, type 2 ([place])   . Diverticulosis   . Gout   . Hypertension   . Prostate cancer [facility])    Prostatic adenocarcinoma, Gleason score 7 (3+4), s/p radical prostatectomy 12/04/10 by Dr. [name].    Review of Systems:  Review of Systems  Constitutional: Negative for chills and fever.  Gastrointestinal: Negative for constipation and diarrhea.    Physical Exam:  Vitals:   07/26/19 1503  BP: 104/65  Pulse: 64  Temp: 98.8 F (37.1 C)  TempSrc: Oral  SpO2: 98%  Weight: 201 lb 12.8 oz (91.5 kg)  Height: 5\' 5"  (1.651 m)   .Physical Exam Constitutional:      Appearance: He is obese. He is not ill-appearing.  Cardiovascular:     Heart sounds: Normal heart sounds. No murmur. No friction rub. No gallop.   Pulmonary:     Effort: Pulmonary effort is normal.     Breath sounds: Normal breath sounds.  Abdominal:     General: Bowel sounds are normal.     Tenderness: There is abdominal tenderness (below umbilicus). There is no rebound.  Skin:    General: Skin is warm and dry.  Psychiatric:        Mood and Affect: Mood normal.        Behavior: Behavior normal.      Assessment & Plan:   See Encounters Tab for problem based charting.  Patient seen with Dr. Daryll Drown

## 2019-07-27 LAB — CMP14 + ANION GAP
ALT: 27 IU/L (ref 0–44)
AST: 28 IU/L (ref 0–40)
Albumin/Globulin Ratio: 1.4 (ref 1.2–2.2)
Albumin: 4.2 g/dL (ref 3.8–4.8)
Alkaline Phosphatase: 58 IU/L (ref 39–117)
Anion Gap: 13 mmol/L (ref 10.0–18.0)
BUN/Creatinine Ratio: 14 (ref 10–24)
BUN: 20 mg/dL (ref 8–27)
Bilirubin Total: 0.4 mg/dL (ref 0.0–1.2)
CO2: 24 mmol/L (ref 20–29)
Calcium: 9.9 mg/dL (ref 8.6–10.2)
Chloride: 101 mmol/L (ref 96–106)
Creatinine, Ser: 1.48 mg/dL — ABNORMAL HIGH (ref 0.76–1.27)
GFR calc Af Amer: 56 mL/min/{1.73_m2} — ABNORMAL LOW (ref >59)
GFR calc non Af Amer: 49 mL/min/{1.73_m2} — ABNORMAL LOW (ref >59)
Globulin, Total: 3.1 g/dL (ref 1.5–4.5)
Glucose: 123 mg/dL — ABNORMAL HIGH (ref 65–99)
Potassium: 4.6 mmol/L (ref 3.5–5.2)
Sodium: 138 mmol/L (ref 134–144)
Total Protein: 7.3 g/dL (ref 6.0–8.5)

## 2019-07-27 LAB — URINALYSIS, ROUTINE W REFLEX MICROSCOPIC
Bilirubin, UA: NEGATIVE
Glucose, UA: NEGATIVE
Leukocytes,UA: NEGATIVE
Nitrite, UA: NEGATIVE
Protein,UA: NEGATIVE
RBC, UA: NEGATIVE
Specific Gravity, UA: 1.027 (ref 1.005–1.030)
Urobilinogen, Ur: 0.2 mg/dL (ref 0.2–1.0)
pH, UA: 5 (ref 5.0–7.5)

## 2019-07-27 LAB — CBC WITH DIFFERENTIAL/PLATELET
Basophils Absolute: 0 10*3/uL (ref 0.0–0.2)
Basos: 1 %
EOS (ABSOLUTE): 0.3 10*3/uL (ref 0.0–0.4)
Eos: 4 %
Hematocrit: 39.3 % (ref 37.5–51.0)
Hemoglobin: 12.9 g/dL — ABNORMAL LOW (ref 13.0–17.7)
Immature Grans (Abs): 0 10*3/uL (ref 0.0–0.1)
Immature Granulocytes: 0 %
Lymphocytes Absolute: 2.1 10*3/uL (ref 0.7–3.1)
Lymphs: 31 %
MCH: 28.6 pg (ref 26.6–33.0)
MCHC: 32.8 g/dL (ref 31.5–35.7)
MCV: 87 fL (ref 79–97)
Monocytes Absolute: 0.5 10*3/uL (ref 0.1–0.9)
Monocytes: 7 %
Neutrophils Absolute: 3.9 10*3/uL (ref 1.4–7.0)
Neutrophils: 57 %
Platelets: 240 10*3/uL (ref 150–450)
RBC: 4.51 x10E6/uL (ref 4.14–5.80)
RDW: 14.5 % (ref 11.6–15.4)
WBC: 6.7 10*3/uL (ref 3.4–10.8)

## 2019-07-27 LAB — PSA: Prostate Specific Ag, Serum: <0.1 ng/mL (ref 0.0–4.0)

## 2019-07-28 NOTE — Progress Notes (Signed)
Internal Medicine Clinic Attending  I saw and evaluated the patient.  I personally confirmed the key portions of the history and exam documented by Dr. Steen and I reviewed pertinent patient test results.  The assessment, diagnosis, and plan were formulated together and I agree with the documentation in the resident's note.     

## 2019-08-03 ENCOUNTER — Ambulatory Visit (HOSPITAL_COMMUNITY)
Admission: RE | Admit: 2019-08-03 | Discharge: 2019-08-03 | Disposition: A | Discharge status: Home / Self Care | Payer: Medicare Other | Source: Ambulatory Visit | Attending: Internal Medicine | Admitting: Internal Medicine

## 2019-08-03 ENCOUNTER — Other Ambulatory Visit: Payer: Self-pay

## 2019-08-03 DIAGNOSIS — R1031 Right lower quadrant pain: Secondary | ICD-10-CM | POA: Insufficient documentation

## 2019-08-03 DIAGNOSIS — R109 Unspecified abdominal pain: Secondary | ICD-10-CM | POA: Diagnosis not present

## 2019-08-03 MED ORDER — IOHEXOL 300 MG/ML  SOLN
100.0000 mL | Freq: Once | INTRAMUSCULAR | Status: AC | PRN
  Administered 2019-08-03: 100 mL via INTRAVENOUS

## 2019-08-08 ENCOUNTER — Other Ambulatory Visit: Payer: Self-pay | Admitting: Internal Medicine

## 2019-08-08 ENCOUNTER — Ambulatory Visit (HOSPITAL_COMMUNITY): Payer: Medicare Other

## 2019-08-08 DIAGNOSIS — M109 Gout, unspecified: Secondary | ICD-10-CM

## 2019-08-16 ENCOUNTER — Telehealth: Payer: Self-pay | Admitting: Internal Medicine

## 2019-08-16 DIAGNOSIS — I7772 Dissection of iliac artery: Secondary | ICD-10-CM

## 2019-08-16 NOTE — Telephone Encounter (Signed)
Patient has history of diverticulitis and I saw him in Clinic on the 11th. He was sent for CT Abdomen and Pelvis. 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 I reviewed the image and appreciate the finding. I did not see it mentioned on the CT with contrast in July , but appreciate it on review. Shared findings with patient.  Patient denies any symptoms of pain currently, but possible some of his past pain could be related. Will send referral to vascular surgery.  Tamsen Snider, MD PGY1  [phone]

## 2019-09-05 ENCOUNTER — Encounter: Payer: Self-pay | Admitting: *Deleted

## 2019-09-19 ENCOUNTER — Ambulatory Visit (INDEPENDENT_AMBULATORY_CARE_PROVIDER_SITE_OTHER): Payer: Medicare Other | Admitting: Vascular Surgery

## 2019-09-19 ENCOUNTER — Encounter: Payer: Self-pay | Admitting: Vascular Surgery

## 2019-09-19 ENCOUNTER — Other Ambulatory Visit: Payer: Self-pay

## 2019-09-19 DIAGNOSIS — I7772 Dissection of iliac artery: Secondary | ICD-10-CM

## 2019-09-19 NOTE — Progress Notes (Signed)
Patient name: Austin Anderson MRN: [id number] DOB: 1951/07/01 Sex: male  REASON FOR CONSULT: Left external iliac artery dissection noted on CT  HPI: Austin Anderson is a 69 y.o. male, with history of prostate cancer, hypertension, diabetes, blindness that presents for evaluation of incidentally discovered left external iliac artery dissection.  Patient seems to have limited understanding about his presentation today.  States he had no knowledge of the dissection prior to the CT scan.  States the CT was for further work-up of his abdominal pain and suspected diverticulitis.  He denies any instrumentation in his left groin including no previous left femoral artery access.  His only surgical history includes a robot-assisted prostatectomy.  He has no pain in the left leg.  States he can walk on a treadmill without issues and no pain in the calf or thigh.  Past Medical History:  Diagnosis Date  . Asthma    Reported by patient's last physician before coming to [facility].  No PFT's on file.   . Diabetes mellitus, type 2 ([place])   . Diverticulosis   . Gout   . Hypertension   . Prostate cancer [facility])    Prostatic adenocarcinoma, Gleason score 7 (3+4), s/p radical prostatectomy 12/04/10 by Dr. [name].     History reviewed. No pertinent surgical history.  Family History  Problem Relation Age of Onset  . Heart attack Father 47  . Heart disease Mother   . Diabetes Sister     SOCIAL HISTORY: Social History   Socioeconomic History  . Marital status: Single    Spouse name: Not on file  . Number of children: Not on file  . Years of education: Not on file  . Highest education level: Not on file  Occupational History  . Not on file  Tobacco Use  . Smoking status: Former Smoker    Types: Cigarettes  . Smokeless tobacco: Never Used  . Tobacco comment: 30 years ago  Substance and Sexual Activity  . Alcohol use: No    Alcohol/week: 0.0 standard drinks  . Drug use: No  . Sexual activity: Not on file    Other Topics Concern  . Not on file  Social History Narrative   The patient lives alone in [place].  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  The patient is on medicaid/medicare, and receives a disability check.  The patient has a sister in [place].  The patient has a 10th grade education.  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  The patient lost his vision at age 46, and now can only barely see light, but no colors or shapes.  The patient has an aide who comes to his house every week to help him sort his medications.   Social Determinants of Health   Financial Resource Strain:   . Difficulty of Paying Living Expenses: Not on file  Food Insecurity:   . Worried About Charity fundraiser in the Last Year: Not on file  . Ran Out of Food in the Last Year: Not on file  Transportation Needs:   . Lack of Transportation (Medical): Not on file  . Lack of Transportation (Non-Medical): Not on file  Physical Activity:   . Days of Exercise per Week: Not on file  . Minutes of Exercise per Session: Not on file  Stress:   . Feeling of Stress : Not on  file  Social Connections:   . Frequency of Communication with Friends and Family: Not on file  . Frequency of Social Gatherings with Friends and Family: Not on file  . Attends Religious Services: Not on file  . Active Member of Clubs or Organizations: Not on file  . Attends Archivist Meetings: Not on file  . Marital Status: Not on file  Intimate Partner Violence:   . Fear of Current or Ex-Partner: Not on file  . Emotionally Abused: Not on file  . Physically Abused: Not on file  . Sexually Abused: Not on file    No Known Allergies  Current Outpatient Medications  Medication Sig Dispense Refill  . albuterol (PROAIR HFA) 108 (90 Base) MCG/ACT inhaler TAKE 2 PUFFS BY MOUTH EVERY 6 HOURS AS NEEDED FOR WHEEZE 25.5 Inhaler 2  . allopurinol  (ZYLOPRIM) 100 MG tablet TAKE 3 TABLETS BY MOUTH DAILY. 270 tablet 2  . atenolol (TENORMIN) 25 MG tablet TAKE 1 TABLET BY MOUTH EVERY DAY 90 tablet 2  . atorvastatin (LIPITOR) 40 MG tablet TAKE 1 TABLET (40 MG TOTAL) DAILY BY MOUTH. 90 tablet 2  . hydrochlorothiazide (MICROZIDE) 12.5 MG capsule TAKE 1 CAPSULE BY MOUTH EVERY DAY 90 capsule 2  . losartan (COZAAR) 100 MG tablet Take 1 tablet (100 mg total) by mouth daily. 90 tablet 2  . metFORMIN (GLUCOPHAGE-XR) 500 MG 24 hr tablet TAKE 2 TABLETS BY MOUTH TWICE A DAY 360 tablet 2  . polyethylene glycol (MIRALAX / GLYCOLAX) packet TAKE 17 GM (1 PACKET) MIXED IN LIQUID BY MOUTH DAILY. 84 packet 0  . fluticasone (FLONASE) 50 MCG/ACT nasal spray Place 1 spray daily as needed into both nostrils for allergies or rhinitis. 9.9 g 2   No current facility-administered medications for this visit.    REVIEW OF SYSTEMS:  [X]  denotes positive finding, [ ]  denotes negative finding Cardiac  Comments:  Chest pain or chest pressure:    Shortness of breath upon exertion:    Short of breath when lying flat:    Irregular heart rhythm:        Vascular    Pain in calf, thigh, or hip brought on by ambulation:    Pain in feet at night that wakes you up from your sleep:     Blood clot in your veins:    Leg swelling:         Pulmonary    Oxygen at home:    Productive cough:     Wheezing:         Neurologic    Sudden weakness in arms or legs:     Sudden numbness in arms or legs:     Sudden onset of difficulty speaking or slurred speech:    Temporary loss of vision in one eye:     Problems with dizziness:         Gastrointestinal    Blood in stool:     Vomited blood:         Genitourinary    Burning when urinating:     Blood in urine:        Psychiatric    Major depression:         Hematologic    Bleeding problems:    Problems with blood clotting too easily:        Skin    Rashes or ulcers:        Constitutional    Fever or chills:       PHYSICAL  EXAM: Vitals:   09/19/19 1413  BP: 115/80  Pulse: 68  Resp: 18  Temp: 97.6 F (36.4 C)  TempSrc: Temporal  SpO2: 96%  Weight: 200 lb (90.7 kg)  Height: 5\' 5"  (1.651 m)    GENERAL: The patient is a well-nourished male, in no acute distress. The vital signs are documented above. CARDIAC: There is a regular rate and rhythm.  VASCULAR:  2+ palpable left femoral, 2+ palpable right femoral pulse  2+ palpable dorsalis pedis pulses bilaterally PULMONARY: There is good air exchange bilaterally without wheezing or rales. ABDOMEN: Soft and non-tender with normal pitched bowel sounds.  MUSCULOSKELETAL: There are no major deformities or cyanosis. NEUROLOGIC: No focal weakness or paresthesias are detected. SKIN: There are no ulcers or rashes noted. PSYCHIATRIC: The patient has a normal affect.  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  This was initially noted on CT from 08/03/2019 but in review of an old CT from 04/14/2019 there is evidence of the dissection on that scan.  He has a easily palpable left femoral as well as left pedal pulses.  I am not exactly clear as to the etiology of his dissection but certainly does not appear to be flow-limiting and he is asymptomatic.  He does not need any surgical intervention from our standpoint at this time.  I did offer follow-up again in 1 year for another duplex for ongoing surveillance and discussed not clear guidelines on surveillance for this incidental chronic dissection.  He will call with questions or concerns particularly if he starts developing any issues in his left lower extremity.   Marty Heck, MD [facility] Office: [phone]

## 2019-09-22 ENCOUNTER — Other Ambulatory Visit: Payer: Self-pay | Admitting: *Deleted

## 2019-09-22 DIAGNOSIS — I7772 Dissection of iliac artery: Secondary | ICD-10-CM

## 2019-10-05 ENCOUNTER — Other Ambulatory Visit: Payer: Self-pay

## 2019-10-05 DIAGNOSIS — I10 Essential (primary) hypertension: Secondary | ICD-10-CM

## 2019-10-09 MED ORDER — HYDROCHLOROTHIAZIDE 12.5 MG PO CAPS: 12.5000 mg | ORAL_CAPSULE | Freq: Every day | ORAL | 2 refills | Status: DC

## 2019-10-09 MED ORDER — ATENOLOL 25 MG PO TABS: 25.0000 mg | ORAL_TABLET | Freq: Every day | ORAL | 2 refills | Status: DC

## 2019-10-10 ENCOUNTER — Telehealth: Payer: Self-pay | Admitting: Internal Medicine

## 2019-10-10 NOTE — Telephone Encounter (Signed)
Pt has been having pain in stomach, pt wanted some pain medicine;  [phone]

## 2019-10-10 NOTE — Telephone Encounter (Signed)
Return call to pt, he states this pain is ongoing due to what he thinks"they told me" is diverticulosis. States he needs pain medicine ACC 1/29 at [facility]

## 2019-10-11 NOTE — Telephone Encounter (Signed)
Thank you for scheduling patient.

## 2019-10-13 ENCOUNTER — Ambulatory Visit (INDEPENDENT_AMBULATORY_CARE_PROVIDER_SITE_OTHER): Payer: Medicare Other | Admitting: Internal Medicine

## 2019-10-13 ENCOUNTER — Telehealth: Payer: Self-pay | Admitting: *Deleted

## 2019-10-13 VITALS — BP 152/95 | HR 64 | Temp 98.1°F | Ht 65.0 in | Wt 191.1 lb

## 2019-10-13 DIAGNOSIS — R110 Nausea: Secondary | ICD-10-CM | POA: Diagnosis not present

## 2019-10-13 DIAGNOSIS — Z8719 Personal history of other diseases of the digestive system: Secondary | ICD-10-CM | POA: Diagnosis not present

## 2019-10-13 DIAGNOSIS — R1030 Lower abdominal pain, unspecified: Secondary | ICD-10-CM

## 2019-10-13 DIAGNOSIS — H547 Unspecified visual loss: Secondary | ICD-10-CM | POA: Diagnosis not present

## 2019-10-13 DIAGNOSIS — Z9079 Acquired absence of other genital organ(s): Secondary | ICD-10-CM | POA: Diagnosis not present

## 2019-10-13 DIAGNOSIS — Z8546 Personal history of malignant neoplasm of prostate: Secondary | ICD-10-CM

## 2019-10-13 MED ORDER — SENNOSIDES-DOCUSATE SODIUM 8.6-50 MG PO TABS: 1.0000 | ORAL_TABLET | Freq: Every day | ORAL | 0 refills | Status: DC

## 2019-10-13 MED ORDER — HYDROCODONE-ACETAMINOPHEN 5-300 MG PO TABS: 1.0000 | ORAL_TABLET | Freq: Three times a day (TID) | ORAL | 0 refills | Status: DC | PRN

## 2019-10-13 NOTE — Telephone Encounter (Signed)
Called pt - mailbox is full; unable to leave a message. 

## 2019-10-13 NOTE — Progress Notes (Signed)
   CC: suprapubic pain  HPI:  [name] is a 69 y.o. with PMH as below.   Please see A&P for assessment of the patient's acute and chronic medical conditions.   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 Pain is at most 6/10 but sometimes a little lower and is dull. He states it's somewhat similar to previous diverticulitis pain but it seemed sharper before. He takes a stool softener and has BM 2-3 times per day. He has an aide that he has look at his stool, denies BRB in stool, dark stools, diarrhea or constipation. He has mild increase abdominal pain when he urinates but not when he defecates. He denies dysuria, difficulty starting stream, or sensation of incomplete emptying. He denies fever or chills, leg pain, or numbness. He has chronic mild nausea that is unchanged. Eating does effect his pain.    Past Medical History:  Diagnosis Date  . Asthma    Reported by patient's last physician before coming to [facility].  No PFT's on file.   . Diabetes mellitus, type 2 ([place])   . Diverticulosis   . Gout   . Hypertension   . Prostate cancer [facility])    Prostatic adenocarcinoma, Gleason score 7 (3+4), s/p radical prostatectomy 12/04/10 by Dr. [name].    Review of Systems:   10 point ROS negative except as noted in HPI  Physical Exam: Constitution: NAD, appears stated age Eyes: blind, wearing sunglasses Cardio: RRR, no m/r/g, no LE edema, +2 pedal pulses Abdominal: mild TTP suprapubic region, soft, non-distended, +BS Skin: c/d/i    Vitals:   10/13/19 0906  BP: (!) 152/95  Pulse: 64  Temp: 98.1 F (36.7 C)  TempSrc: Oral  SpO2: 99%  Weight: 191 lb 1.6 oz (86.7 kg)  Height: 5\' 5"  (1.651 m)     Assessment & Plan:   See Encounters Tab for problem based charting.  Patient discussed with Dr. Dareen Piano

## 2019-10-13 NOTE — Telephone Encounter (Signed)
Please call patient problem with medicines at the pharmacy.

## 2019-10-13 NOTE — Assessment & Plan Note (Signed)
PE significant for just mild tenderness, no guarding or rebound tenderness, non-distended. Pedal pulses 2+ and intact. Symptoms consistent with mild diverticulitis. Pain from chronic dissection also a possibility although pulses are strong and he has no leg pain. As he has had multiple rounds of antibiotics in the last year and studies have shown resolution without antibiotics, will start with pain management only.   - Percocet 5-300 mg q8h prn for five days, senna-docusate while taking opioids on top of regular stool softener  - CBC, CMP, UA  - f/u in four days for telehealth visit  - if symptoms are unchanged or worsen, start antibiotics and consider CT to evaluate his dissection as well as diverticulitis  - f/u earlier if symptoms worsen acutely or development of fever or other worrisome symptoms.

## 2019-10-13 NOTE — Patient Instructions (Addendum)
Thank you for allowing [place] to provide your care today. Today we discussed your lower abdominal pain  I have ordered the following labs for you:  Complete blood count, complete metabolic panel, and urinalysis   I will call if any are abnormal.    Today we made the following changes to your medications:   Please START taking  Hydrocodone-acetaminophen 5-300 Mg - take one tablet every 8 hours only when needed for abdominal pain   Senna-docusate - take one tablet every day while taking the hydrocodone.    Please follow-up in four days with telehealth visit. If your symptoms worsen or you develop fever, please call the clinic for an earlier appointment.     Please call the internal medicine center clinic if you have any questions or concerns, we may be able to help and keep you from a long and expensive emergency room wait. Our clinic and after hours phone number is [phone], the best time to call is Monday through Friday 9 am to 4 pm but there is always someone available 24/7 if you have an emergency. If you need medication refills please notify your pharmacy one week in advance and they will send [place] a request.

## 2019-10-14 LAB — CBC WITH DIFFERENTIAL/PLATELET
Basophils Absolute: 0 10*3/uL (ref 0.0–0.2)
Basos: 1 %
EOS (ABSOLUTE): 0.3 10*3/uL (ref 0.0–0.4)
Eos: 5 %
Hematocrit: 40.3 % (ref 37.5–51.0)
Hemoglobin: 12.9 g/dL — ABNORMAL LOW (ref 13.0–17.7)
Immature Grans (Abs): 0 10*3/uL (ref 0.0–0.1)
Immature Granulocytes: 0 %
Lymphocytes Absolute: 2 10*3/uL (ref 0.7–3.1)
Lymphs: 29 %
MCH: 28.2 pg (ref 26.6–33.0)
MCHC: 32 g/dL (ref 31.5–35.7)
MCV: 88 fL (ref 79–97)
Monocytes Absolute: 0.7 10*3/uL (ref 0.1–0.9)
Monocytes: 10 %
Neutrophils Absolute: 4 10*3/uL (ref 1.4–7.0)
Neutrophils: 55 %
Platelets: 189 10*3/uL (ref 150–450)
RBC: 4.58 x10E6/uL (ref 4.14–5.80)
RDW: 14.5 % (ref 11.6–15.4)
WBC: 7.1 10*3/uL (ref 3.4–10.8)

## 2019-10-14 LAB — CMP14 + ANION GAP
ALT: 12 IU/L (ref 0–44)
AST: 20 IU/L (ref 0–40)
Albumin/Globulin Ratio: 1.4 (ref 1.2–2.2)
Albumin: 4.2 g/dL (ref 3.8–4.8)
Alkaline Phosphatase: 53 IU/L (ref 39–117)
Anion Gap: 17 mmol/L (ref 10.0–18.0)
BUN/Creatinine Ratio: 15 (ref 10–24)
BUN: 20 mg/dL (ref 8–27)
Bilirubin Total: 0.6 mg/dL (ref 0.0–1.2)
CO2: 24 mmol/L (ref 20–29)
Calcium: 9.8 mg/dL (ref 8.6–10.2)
Chloride: 103 mmol/L (ref 96–106)
Creatinine, Ser: 1.34 mg/dL — ABNORMAL HIGH (ref 0.76–1.27)
GFR calc Af Amer: 63 mL/min/{1.73_m2} (ref >59)
GFR calc non Af Amer: 55 mL/min/{1.73_m2} — ABNORMAL LOW (ref >59)
Globulin, Total: 3 g/dL (ref 1.5–4.5)
Glucose: 106 mg/dL — ABNORMAL HIGH (ref 65–99)
Potassium: 4.4 mmol/L (ref 3.5–5.2)
Sodium: 144 mmol/L (ref 134–144)
Total Protein: 7.2 g/dL (ref 6.0–8.5)

## 2019-10-14 LAB — URINALYSIS, ROUTINE W REFLEX MICROSCOPIC
Bilirubin, UA: NEGATIVE
Glucose, UA: NEGATIVE
Ketones, UA: NEGATIVE
Leukocytes,UA: NEGATIVE
Nitrite, UA: NEGATIVE
Protein,UA: NEGATIVE
RBC, UA: NEGATIVE
Specific Gravity, UA: 1.026 (ref 1.005–1.030)
Urobilinogen, Ur: 0.2 mg/dL (ref 0.2–1.0)
pH, UA: 5 (ref 5.0–7.5)

## 2019-10-16 ENCOUNTER — Other Ambulatory Visit: Payer: Self-pay | Admitting: Internal Medicine

## 2019-10-16 DIAGNOSIS — R1030 Lower abdominal pain, unspecified: Secondary | ICD-10-CM

## 2019-10-16 MED ORDER — HYDROCODONE-ACETAMINOPHEN 5-325 MG PO TABS: 1.0000 | ORAL_TABLET | Freq: Three times a day (TID) | ORAL | 0 refills | Status: DC | PRN

## 2019-10-16 NOTE — Telephone Encounter (Signed)
Pt called / informed of Hydrocodone 5/325 mg rx sent to [facility].

## 2019-10-16 NOTE — Telephone Encounter (Signed)
Called pt - stated [facility] does not [facility].  I called [facility] - stated Hydrocodone 5/300 is not covered by pt's insurance; but 5/325 mg is. Please send new rx Thanks

## 2019-10-16 NOTE — Telephone Encounter (Signed)
Sent new prescription

## 2019-10-16 NOTE — Telephone Encounter (Signed)
Per pt pharmacy is unable to refill [facility] in [facility]; pt contact [phone]

## 2019-10-16 NOTE — Progress Notes (Signed)
Internal Medicine Clinic Attending  Case discussed with Dr. Seawell at the time of the visit.  We reviewed the resident's history and exam and pertinent patient test results.  I agree with the assessment, diagnosis, and plan of care documented in the resident's note.    

## 2019-10-16 NOTE — Progress Notes (Signed)
Hydrocodone- acetaminophen 5-300 mg not covered by insurance. Resent new prescription for 5-325 mg q8h PRN x 5 days.

## 2019-10-18 ENCOUNTER — Ambulatory Visit (INDEPENDENT_AMBULATORY_CARE_PROVIDER_SITE_OTHER): Payer: Medicare Other | Admitting: Internal Medicine

## 2019-10-18 ENCOUNTER — Encounter: Payer: Self-pay | Admitting: Internal Medicine

## 2019-10-18 ENCOUNTER — Other Ambulatory Visit: Payer: Self-pay

## 2019-10-18 DIAGNOSIS — R1032 Left lower quadrant pain: Secondary | ICD-10-CM

## 2019-10-18 NOTE — Progress Notes (Signed)
  [facility] Telephone Encounter Continuity Care Appointment  HPI:   This telephone encounter was created for [name] on 10/18/2019 for the following purpose/cc: Follow-up lower abdominal pain  [name] is a 69 year old very pleasant community dwelling gentleman with blindness, recurrent diverticulitis, prostate cancer status post prostatectomy, stable left external iliac artery dissection whom I had the pleasure of speaking to on the phone to follow-up on recent clinic visit for lower abdominal pain.  Please see problem based charting for further details.    Past Medical History:  Past Medical History:  Diagnosis Date  . Asthma    Reported by patient's last physician before coming to [facility].  No PFT's on file.   . Diabetes mellitus, type 2 ([place])   . Diverticulosis   . Gout   . Hypertension   . Prostate cancer [facility])    Prostatic adenocarcinoma, Gleason score 7 (3+4), s/p radical prostatectomy 12/04/10 by Dr. [name].       ROS:   As per HPI   Assessment / Plan / Recommendations:   Please see A&P under problem oriented charting for assessment of the patient's acute and chronic medical conditions.   As always, pt is advised that if symptoms worsen or new symptoms arise, they should go to an urgent care facility or to to ER for further evaluation.   Consent and Medical Decision Making:   Patient discussed with Dr. Lynnae January  This is a telephone encounter between [name] and [name] on 10/18/2019 for following up on abdominal pain. The visit was conducted with the patient located at home and [name] at [facility]. The patient's identity was confirmed using their DOB and current address. The patient has consented to being evaluated through a telephone encounter and understands the associated risks (an examination cannot be done and the patient may need to come in for an appointment) / benefits (allows the patient to remain at home, decreasing  exposure to coronavirus). I personally spent 13 minutes on medical discussion.

## 2019-10-18 NOTE — Assessment & Plan Note (Signed)
Lower abdominal pain: He was evaluated by one of my colleagues Dr. Sharon Seller on October 13, 2019 with a 2-week complaint of suprapubic abdominal pain that worsened with urination.  He did not complain of any systemic findings such as fevers, chills, nausea or vomiting.  During that visit, he had laboratory exams performed which did not reveal leukocytosis or abnormal urine finding on urinalysis.  He was given a short course of Percocet which he states he started taking yesterday which has improved his abdominal pain.  He rates the pain at 2/10 from a 4/10.  It did show stable creatinine.  His other 2 previous clinic visits were April 14, 2019 and July 26, 2019 for which she complained of abdominal pain.  During these 2 visits, he was started on ciprofloxacin and Flagyl.  He did have radiographic findings on CT abdomen and pelvis that was performed on April 14, 2019.  Repeat CT abdomen and pelvis with contrast on April 02, 2019 showed resolution of diverticulitis.   Assessment/Plan: Given the symptoms has improved, which is not patient centered discussion on either resuming short course of antibiotics versus conservatively watching versus obtaining CT abdomen and pelvis.  Per the patient, he states that antibiotics "makes me sick "and I would like to wait and see how I do first.  We also brought up the discussion of possible referral to general surgery for colectomy and he states he would like to wait and think about it.

## 2019-10-19 NOTE — Progress Notes (Signed)
Internal Medicine Clinic Attending  Case discussed with Dr. Agyei at the time of the visit.  We reviewed the resident's history and exam and pertinent patient test results.  I agree with the assessment, diagnosis, and plan of care documented in the resident's note.    

## 2019-10-26 ENCOUNTER — Other Ambulatory Visit: Payer: Self-pay

## 2019-10-26 DIAGNOSIS — K5790 Diverticulosis of intestine, part unspecified, without perforation or abscess without bleeding: Secondary | ICD-10-CM

## 2019-10-26 DIAGNOSIS — R1030 Lower abdominal pain, unspecified: Secondary | ICD-10-CM

## 2019-10-26 NOTE — Telephone Encounter (Signed)
Requesting to speak with a nurse about med for stomach pain. Please call pt back.

## 2019-10-26 NOTE — Telephone Encounter (Signed)
Pt states his lower abd just hurts and hurts, states due to transportation he cannot come in before wed, appt [facility] 2/17 at 0945.  Wants dr Court Joy to send him some more pain med to last until then

## 2019-10-27 MED ORDER — HYDROCODONE-ACETAMINOPHEN 5-325 MG PO TABS: 1.0000 | ORAL_TABLET | Freq: Three times a day (TID) | ORAL | 0 refills | Status: DC | PRN

## 2019-10-27 MED ORDER — AMOXICILLIN-POT CLAVULANATE 875-125 MG PO TABS: 1.0000 | ORAL_TABLET | Freq: Three times a day (TID) | ORAL | 0 refills | Status: DC

## 2019-10-27 NOTE — Telephone Encounter (Signed)
Austin Anderson is a 69 year old with diverticulosis and seen multiple times recently for diverticulitis. Today he called the clinic for more pain medication to hold him over until  his appointment on 2/17. I called him to check on his abdominal pain before prescribing pain medications.  He continues to have the same abdominal pain he has with diverticulitis.  He declined Rx of ciprofloxacin and Flagyl because he gets sick these medications recently.. I expect this is from Flagyl. He has been treated with Augmentin in the past and had recurrent diverticulitis. This may not represent failure of the antibiotics, but the condition of his diverticulosis. I will prescribe Patient is having regular bowel movement. Discussed continuing sena-docustate with hydrocodone. Counseled patient to go to the emergency room if his pain worsens , if he has fever, loss of appetite , or overall does not start to get better with antibiotics.    P:  Augmentin 875-125 TID x 7days and a 5-day supply of hydrocodone 5-325 q8hrs for pain

## 2019-10-27 NOTE — Telephone Encounter (Signed)
Pt is calling back pls contact [phone]

## 2019-10-31 ENCOUNTER — Other Ambulatory Visit: Payer: Self-pay | Admitting: *Deleted

## 2019-10-31 DIAGNOSIS — E119 Type 2 diabetes mellitus without complications: Secondary | ICD-10-CM

## 2019-10-31 NOTE — Telephone Encounter (Signed)
Discussed with Dr Court Joy at the time, agree with plan.

## 2019-11-01 ENCOUNTER — Other Ambulatory Visit: Payer: Self-pay

## 2019-11-01 ENCOUNTER — Ambulatory Visit (INDEPENDENT_AMBULATORY_CARE_PROVIDER_SITE_OTHER): Payer: Medicare Other | Admitting: Internal Medicine

## 2019-11-01 ENCOUNTER — Encounter: Payer: Self-pay | Admitting: Internal Medicine

## 2019-11-01 VITALS — BP 142/80 | HR 70 | Temp 98.4°F

## 2019-11-01 DIAGNOSIS — R109 Unspecified abdominal pain: Secondary | ICD-10-CM

## 2019-11-01 DIAGNOSIS — Z792 Long term (current) use of antibiotics: Secondary | ICD-10-CM | POA: Diagnosis not present

## 2019-11-01 DIAGNOSIS — R1032 Left lower quadrant pain: Secondary | ICD-10-CM

## 2019-11-01 DIAGNOSIS — Z8719 Personal history of other diseases of the digestive system: Secondary | ICD-10-CM | POA: Diagnosis not present

## 2019-11-01 NOTE — Assessment & Plan Note (Signed)
Patient has a history of recurrent mild diverticulitis requiring several courses of antibiotics in the past 12 months. Most recent CT abd/pelvis on 07/2019 showed resolution of diverticulitis. However, he has continued to experience episodes of abdominal pain that improve with antibiotic therapy. Most recently he was prescribed Augmetin TID x 7 days. His abdominal pain has resolved since starting antibiotics. He reports good PO intake and regular BMs while on stool softener. Denies nausea and vomiting. Abdominal exam is unremarkable today. Will continue to monitor and reassess for symptoms once he completes antibiotic course.

## 2019-11-01 NOTE — Patient Instructions (Addendum)
Austin Anderson,   I am glad your abdominal pain is better. Please continue taking your antibiotic as prescribed. Please call us if your abdominal pain returns once you finish your antibiotic.   - Dr. Frederico Hamman

## 2019-11-01 NOTE — Progress Notes (Signed)
Internal Medicine Clinic Attending  Case discussed with Dr. Santos-Sanchez at the time of the visit.  We reviewed the resident's history and exam and pertinent patient test results.  I agree with the assessment, diagnosis, and plan of care documented in the resident's note.    

## 2019-11-01 NOTE — Progress Notes (Signed)
   CC: abdominal pain   HPI:  [name] is a 69 y.o. year-old male with PMH listed below who presents to clinic for abdominal pain. Please see problem based assessment and plan for further details.   Past Medical History:  Diagnosis Date  . Asthma    Reported by patient's last physician before coming to [facility].  No PFT's on file.   . Diabetes mellitus, type 2 ([place])   . Diverticulosis   . Gout   . Hypertension   . Prostate cancer [facility])    Prostatic adenocarcinoma, Gleason score 7 (3+4), s/p radical prostatectomy 12/04/10 by Dr. [name].    Review of Systems:   Review of Systems  Constitutional: Negative for chills, fever, malaise/fatigue and weight loss.  Gastrointestinal: Negative for abdominal pain, blood in stool, constipation, diarrhea, melena, nausea and vomiting.    Physical Exam:  Vitals:   11/01/19 0929  BP: (!) 142/80  Pulse: 70  Temp: 98.4 F (36.9 C)  TempSrc: Oral  SpO2: 100%    General: well-appearing male, in NAD  Abd: soft, NTND, normoactive bowel sounds    Assessment & Plan:   See Encounters Tab for problem based charting.  Patient discussed with Dr. Angelia Mould

## 2019-11-03 MED ORDER — ATORVASTATIN CALCIUM 40 MG PO TABS: 40.0000 mg | ORAL_TABLET | Freq: Every day | ORAL | 3 refills | Status: DC

## 2019-11-08 ENCOUNTER — Other Ambulatory Visit: Payer: Self-pay | Admitting: *Deleted

## 2019-11-08 DIAGNOSIS — E119 Type 2 diabetes mellitus without complications: Secondary | ICD-10-CM

## 2019-11-08 MED ORDER — METFORMIN HCL ER 500 MG PO TB24: 1000.0000 mg | ORAL_TABLET | Freq: Two times a day (BID) | ORAL | 1 refills | Status: DC

## 2019-11-15 ENCOUNTER — Telehealth: Payer: Self-pay | Admitting: Internal Medicine

## 2019-11-15 NOTE — Telephone Encounter (Signed)
Called  Austin Anderson and his abdominal pain has returned after his course of antibiotics.  I would like to evaluate him in clinic , he says the earliest he can make it is possibly Monday.  Gave him instructions to go to the emergency department if he has severe abdominal pain or his nurses aide notices blood in his stools. He also would like information on where to schedule a Covid 19 vaccination.  Please share this information when scheduling him for clinic.  Patient is blind , but will have his tape recorder ready when called to record the information.   COVID-19 Vaccine Information can be found at: [email or web address].co.uk For questions related to vaccine distribution or appointments, please [email or web address] or [phone].

## 2019-11-15 NOTE — Telephone Encounter (Signed)
TC to patient, states he has diverticulitis and is c/o mid-lower abdominal pain.  He states it "is the same pain as it always is."  He denies any diarrhea, no blood in his stool, no N/V.   LOV was 11/01/19, he states he received ABX tx and is requesting more ABX today.  Pt offered an appointment this afternoon in [facility] for evaluation.  He states he can't come today and would like to know if he can get ABX without being seen first. Will forward to PCP. Thank you, SChaplin, RN,BSN

## 2019-11-15 NOTE — Telephone Encounter (Signed)
TC to patient, Covid-19 Vaccine sign up information given to patient, he recorded information.  Appointment made in [facility] on Monday, 11/20/19 @ 0915.  Reiterated instructions to go to the ED if he has severe abdominal pain or his nurses aide notices blood in his stool, he verbalized understanding. SChaplin, RN,BSN

## 2019-11-15 NOTE — Telephone Encounter (Signed)
Pls contact pt regarding stomach pain; [phone]

## 2019-11-20 ENCOUNTER — Other Ambulatory Visit: Payer: Self-pay

## 2019-11-20 ENCOUNTER — Ambulatory Visit (INDEPENDENT_AMBULATORY_CARE_PROVIDER_SITE_OTHER): Payer: Medicare Other | Admitting: Internal Medicine

## 2019-11-20 ENCOUNTER — Encounter: Payer: Self-pay | Admitting: Internal Medicine

## 2019-11-20 VITALS — BP 137/79 | HR 67 | Temp 98.4°F | Wt 192.6 lb

## 2019-11-20 DIAGNOSIS — K5790 Diverticulosis of intestine, part unspecified, without perforation or abscess without bleeding: Secondary | ICD-10-CM

## 2019-11-20 NOTE — Progress Notes (Signed)
   CC: recurrent diverticulosis   HPI:[name] is a 69 y.o. male who presents for evaluation of recurrent diverticulosis. Please see individual problem based A/P for details.   Past Medical History:  Diagnosis Date  . Asthma    Reported by patient's last physician before coming to [facility].  No PFT's on file.   . Diabetes mellitus, type 2 ([place])   . Diverticulosis   . Gout   . Hypertension   . Prostate cancer [facility])    Prostatic adenocarcinoma, Gleason score 7 (3+4), s/p radical prostatectomy 12/04/10 by Dr. [name].    Review of Systems:  ROS negative except as per HPI.  Physical Exam: Vitals:   11/20/19 0842  BP: 137/79  Pulse: 67  Temp: 98.4 F (36.9 C)  TempSrc: Oral  SpO2: 99%  Weight: 192 lb 9.6 oz (87.4 kg)    General: Alert, nl appearance Cardiovascular: Normal rate, regular rhythm.  No murmurs, rubs, or gallops Pulmonary : Effort normal, breath sounds normal. No wheezes, rales, or rhonchi Abdominal: soft, nontender,bowel sounds normal Musculoskeletal: no swelling , deformity, injury ,or tenderness in extremities, Skin: Warm, dry , no bruising, erythema, or rash Psychiatric/Behavioral:  normal mood, normal behavior    Assessment & Plan:   See Encounters Tab for problem based charting.  Patient discussed with Dr. Dareen Piano

## 2019-11-20 NOTE — Patient Instructions (Addendum)
Thank you for trusting me with your care. To recap, today we discussed the following:  Diverticula Today your diverticulitis has resolved, but you have recurrent diverticuli. In 2019 your primary care physician sent you to gastroenterology and they recommended referral to surgery if you have repeat diverticulitis. They believe  I have referred you to General Surgery for evaluation.   COVID-19 Vaccine Information can be found at: [email or web address].co.uk For questions related to vaccine distribution or appointments, please email [email or web address] or call [phone].    My best,  Tamsen Snider , MD

## 2019-11-20 NOTE — Assessment & Plan Note (Signed)
  Patient presents for follow-up of diverticulitis.  Patient has had multiple courses of antibiotics in the past year and the most recent CT abdomen pelvis on 11/20 showed resolution of diverticulitis. He was treated in February for recurrence of diverticulitis without imaging.  He is not having any symptoms today.  Patient continues to have good p.o. intake and regular bowel movements with use of stool softener/MiraLAX.  Patient was evaluated by gastroenterology in 2019 and they recommended patient be sent to surgery if he continued to have recurrent episodes of diverticulitis.  Last colonoscopy in 2016 was normal with exception of diverticuli.  Plan: -Referral to general surgery

## 2019-11-21 ENCOUNTER — Other Ambulatory Visit: Payer: Self-pay | Admitting: *Deleted

## 2019-11-21 DIAGNOSIS — J45909 Unspecified asthma, uncomplicated: Secondary | ICD-10-CM

## 2019-11-22 MED ORDER — ALBUTEROL SULFATE HFA 108 (90 BASE) MCG/ACT IN AERS: 2.0000 | INHALATION_SPRAY | Freq: Four times a day (QID) | RESPIRATORY_TRACT | 3 refills | Status: DC | PRN

## 2019-11-22 NOTE — Progress Notes (Signed)
Internal Medicine Clinic Attending ° °Case discussed with Dr. Steen  at the time of the visit.  We reviewed the resident’s history and exam and pertinent patient test results.  I agree with the assessment, diagnosis, and plan of care documented in the resident’s note.  °

## 2019-12-18 DIAGNOSIS — Z8719 Personal history of other diseases of the digestive system: Secondary | ICD-10-CM | POA: Diagnosis not present

## 2019-12-18 DIAGNOSIS — K5730 Diverticulosis of large intestine without perforation or abscess without bleeding: Secondary | ICD-10-CM | POA: Diagnosis not present

## 2020-01-11 ENCOUNTER — Telehealth: Payer: Self-pay | Admitting: *Deleted

## 2020-01-11 ENCOUNTER — Ambulatory Visit (INDEPENDENT_AMBULATORY_CARE_PROVIDER_SITE_OTHER): Payer: Medicare Other | Admitting: Internal Medicine

## 2020-01-11 ENCOUNTER — Other Ambulatory Visit: Payer: Self-pay

## 2020-01-11 DIAGNOSIS — K5790 Diverticulosis of intestine, part unspecified, without perforation or abscess without bleeding: Secondary | ICD-10-CM | POA: Diagnosis not present

## 2020-01-11 MED ORDER — AMOXICILLIN-POT CLAVULANATE 875-125 MG PO TABS: 1.0000 | ORAL_TABLET | Freq: Three times a day (TID) | ORAL | 0 refills | Status: AC

## 2020-01-11 MED ORDER — HYDROCODONE-ACETAMINOPHEN 5-325 MG PO TABS: 1.0000 | ORAL_TABLET | Freq: Three times a day (TID) | ORAL | 0 refills | Status: DC | PRN

## 2020-01-11 NOTE — Telephone Encounter (Signed)
Please schedule for a visit. It would be better to be seen in person, but he may not be able to arrange quickly. Patient was referred to General Surgery at my last visit for recurrent diverticulitis. Please have [place] physician follow up on the status of referral with patient.

## 2020-01-11 NOTE — Telephone Encounter (Signed)
Call from pt - requesting rx for abx for diverticulitis flare-up ; states abd is very sore. Dr Court Joy does pt needs to schedule an in- person or telehealth appt?

## 2020-01-11 NOTE — Progress Notes (Signed)
  [facility] Telephone Encounter Continuity Care Appointment  HPI:   This telephone encounter was created for [name] on 01/11/2020 for the following purpose/cc abdominal pain.   Past Medical History:  Past Medical History:  Diagnosis Date  . Asthma    Reported by patient's last physician before coming to [facility].  No PFT's on file.   . Diabetes mellitus, type 2 ([place])   . Diverticulosis   . Gout   . Hypertension   . Prostate cancer [facility])    Prostatic adenocarcinoma, Gleason score 7 (3+4), s/p radical prostatectomy 12/04/10 by Dr. [name].       ROS:  Review of Systems  Constitutional: Negative for chills and fever.  Gastrointestinal: Positive for abdominal pain and nausea. Negative for vomiting.  All other systems reviewed and are negative.      Assessment / Plan / Recommendations:   Please see A&P under problem oriented charting for assessment of the patient's acute and chronic medical conditions.   As always, pt is advised that if symptoms worsen or new symptoms arise, they should go to an urgent care facility or to to ER for further evaluation.   Consent and Medical Decision Making:   Patient discussed with [name]. This is a telephone encounter between [name] and Jeanmarie Hubert on 01/11/2020 for abdominal pain. The visit was conducted with the patient located at home and Jeanmarie Hubert at [facility]. The patient's identity was confirmed using their DOB and current address. The patient has consented to being evaluated through a telephone encounter and understands the associated risks (an examination cannot be done and the patient may need to come in for an appointment) / benefits (allows the patient to remain at home, decreasing exposure to coronavirus). I personally spent 16 minutes on medical discussion.

## 2020-01-11 NOTE — Telephone Encounter (Signed)
Called pt - informed Dr Court Joy prefers he be seen in person. Wanted to know why he has to come in; stated he rides transportation, so will not be able to come in until next week. Stated abx worked the last time. Asking if something could be call in. I asked if willing to a telehealth appt so he can go over his symptoms with a doctor; hesitant but agreed. I asked if he saw the surgeon ( referral per Dr Court Joy); stated the surgeon told him his diverticulosis was "not bad enough" for surgery at this time. Telehealth schedule w/Dr Aundra Dubin @ [id number] PM ( no open slots in [facility]).

## 2020-01-12 NOTE — Telephone Encounter (Signed)
I spoke to Dr.Maclean, appreciate him seeing Austin Anderson for an [place] visit yesterday.

## 2020-01-12 NOTE — Assessment & Plan Note (Addendum)
Patient reports onset of a "sore" abdominal pain located below and to the left of his navel that started 2 weeks ago.  Patient reports the pain is nonradiating, worse with movement and food intake, constantly present but waxing and waning intensity.  Patient reports pain is not preventing him from moving around his home.  Patient also reports associated swelling, increased one bowel loop over the past couple of weeks.  Patient states he has not checked his temperature but does not feel febrile or have chills or sweats.  Patient reports feeling nauseous but denies vomiting. Patient reports the pain feels very similar to his prior episodes of diverticulitis, reports first episode about 4 to 5 years ago, for 5 episodes since. Patient states this is not his worst episode but feels like one of his normal flareups.  Patient reports he saw a general surgeon for this problem, was counseled on the risks of surgery, and they decided not to pursue surgery at this time given the episodes response to antibiotics.  Patient states ciprofloxacin gives him stomach issues and that augmentin works well for him. Discussed that prior documentation indicates that he had recurrence soon after stopping augmentin. Patient states it was a while after and was just another flare.  Assessment/Plan: Symptoms are consistent with acute diverticulitis. Given history, clinical picture, this is the likely diagnosis. *Augmentin 875-125 mg three times daily for 7 days *Hydrocodone-Acetaminophen 5-325 mg Q8HRs PRN for pain

## 2020-01-15 NOTE — Progress Notes (Signed)
Internal Medicine Clinic Attending  Case discussed with Dr. MacLean at the time of the visit.  We reviewed the resident's history and exam and pertinent patient test results.  I agree with the assessment, diagnosis, and plan of care documented in the resident's note.    

## 2020-01-24 ENCOUNTER — Ambulatory Visit (INDEPENDENT_AMBULATORY_CARE_PROVIDER_SITE_OTHER): Payer: Medicare Other | Admitting: Internal Medicine

## 2020-01-24 ENCOUNTER — Other Ambulatory Visit: Payer: Self-pay

## 2020-01-24 DIAGNOSIS — K5790 Diverticulosis of intestine, part unspecified, without perforation or abscess without bleeding: Secondary | ICD-10-CM | POA: Diagnosis not present

## 2020-01-24 NOTE — Assessment & Plan Note (Signed)
Patient states that he has completed augmentin course and states that his abdominal pain has improved. He states that he currently feels as though his stomach swells up when he eats anything. Denies fevers, chills, nausea, vomiting, diarrhea, no excess flatus. He takes milk of magnesia for constipation.   Assessment and plan  Patient symptoms of diverticulitis seem to have resolved.  Patient inquired about antibiotics for prophylactic use.  I informed him about increasing antibiotic resistance with prolonged use and recommended that he call us back if his symptoms return.  -counseled on eating fiber rich foods, drink plenty of water, and stay active moving about -make a food diary and bring to next visit

## 2020-01-24 NOTE — Progress Notes (Signed)
  [facility] Telephone Encounter Continuity Care Appointment  HPI:   This telephone encounter was created for [name] on 01/24/2020 for the following purpose/cc follow up of diverticulosis.       Past Medical History:  Past Medical History:  Diagnosis Date  . Asthma    Reported by patient's last physician before coming to [facility].  No PFT's on file.   . Diabetes mellitus, type 2 ([place])   . Diverticulosis   . Gout   . Hypertension   . Prostate cancer [facility])    Prostatic adenocarcinoma, Gleason score 7 (3+4), s/p radical prostatectomy 12/04/10 by Dr. [name].       ROS:   Per hpi   Assessment / Plan / Recommendations:   Please see A&P under problem oriented charting for assessment of the patient's acute and chronic medical conditions.   As always, pt is advised that if symptoms worsen or new symptoms arise, they should go to an urgent care facility or to to ER for further evaluation.   Consent and Medical Decision Making:   Patient discussed with Dr. Rebeca Alert  This is a telephone encounter between [name] and [name] on 01/24/2020 for diverticulosis follow up. The visit was conducted with the patient located at home and [name] at [facility]. The patient's identity was confirmed using their DOB and current address. The patient has consented to being evaluated through a telephone encounter and understands the associated risks (an examination cannot be done and the patient may need to come in for an appointment) / benefits (allows the patient to remain at home, decreasing exposure to coronavirus). I personally spent 10 minutes on medical discussion.

## 2020-01-25 NOTE — Progress Notes (Signed)
Internal Medicine Clinic Attending  Case discussed with Dr. Chundi at the time of the visit.  We reviewed the resident's history and exam and pertinent patient test results.  I agree with the assessment, diagnosis, and plan of care documented in the resident's note.  Alexander Raines, M.D., Ph.D.  

## 2020-05-09 ENCOUNTER — Other Ambulatory Visit: Payer: Self-pay | Admitting: Internal Medicine

## 2020-05-09 DIAGNOSIS — M109 Gout, unspecified: Secondary | ICD-10-CM

## 2020-05-30 ENCOUNTER — Ambulatory Visit (INDEPENDENT_AMBULATORY_CARE_PROVIDER_SITE_OTHER): Payer: Medicare Other | Admitting: Internal Medicine

## 2020-05-30 ENCOUNTER — Encounter: Payer: Self-pay | Admitting: Internal Medicine

## 2020-05-30 VITALS — BP 150/81 | HR 69 | Temp 99.1°F | Ht 65.0 in | Wt 187.9 lb

## 2020-05-30 DIAGNOSIS — E119 Type 2 diabetes mellitus without complications: Secondary | ICD-10-CM

## 2020-05-30 DIAGNOSIS — R0981 Nasal congestion: Secondary | ICD-10-CM | POA: Diagnosis not present

## 2020-05-30 DIAGNOSIS — I10 Essential (primary) hypertension: Secondary | ICD-10-CM

## 2020-05-30 DIAGNOSIS — K5790 Diverticulosis of intestine, part unspecified, without perforation or abscess without bleeding: Secondary | ICD-10-CM

## 2020-05-30 DIAGNOSIS — R809 Proteinuria, unspecified: Secondary | ICD-10-CM | POA: Diagnosis not present

## 2020-05-30 DIAGNOSIS — E1129 Type 2 diabetes mellitus with other diabetic kidney complication: Secondary | ICD-10-CM

## 2020-05-30 LAB — GLUCOSE, CAPILLARY: Glucose-Capillary: 110 mg/dL — ABNORMAL HIGH (ref 70–99)

## 2020-05-30 LAB — POCT GLYCOSYLATED HEMOGLOBIN (HGB A1C): Hemoglobin A1C: 6.9 % — AB (ref 4.0–5.6)

## 2020-05-30 MED ORDER — HYDROCODONE-ACETAMINOPHEN 5-325 MG PO TABS: 1.0000 | ORAL_TABLET | Freq: Four times a day (QID) | ORAL | 0 refills | Status: AC | PRN

## 2020-05-30 MED ORDER — AMOXICILLIN-POT CLAVULANATE 875-125 MG PO TABS: 1.0000 | ORAL_TABLET | Freq: Two times a day (BID) | ORAL | 0 refills | Status: AC

## 2020-05-30 NOTE — Patient Instructions (Addendum)
I will send a course of antibiotics into your pharmacy for your abdominal pain. If your symptoms worsen or if you develop a fever, please be re-evaluation. I will also increase your hydrochlorothiazide for your blood pressure. Please come back in 4-6 weeks so we can recheck your blood pressure and your labs. I will get an A1C on you today and call you with any medication changes that are needed.

## 2020-05-31 ENCOUNTER — Encounter: Payer: Self-pay | Admitting: Internal Medicine

## 2020-05-31 LAB — BASIC METABOLIC PANEL
BUN/Creatinine Ratio: 11 (ref 10–24)
BUN: 15 mg/dL (ref 8–27)
CO2: 23 mmol/L (ref 20–29)
Calcium: 9.6 mg/dL (ref 8.6–10.2)
Chloride: 103 mmol/L (ref 96–106)
Creatinine, Ser: 1.31 mg/dL — ABNORMAL HIGH (ref 0.76–1.27)
GFR calc Af Amer: 65 mL/min/{1.73_m2} (ref >59)
GFR calc non Af Amer: 56 mL/min/{1.73_m2} — ABNORMAL LOW (ref >59)
Glucose: 109 mg/dL — ABNORMAL HIGH (ref 65–99)
Potassium: 4.3 mmol/L (ref 3.5–5.2)
Sodium: 142 mmol/L (ref 134–144)

## 2020-05-31 LAB — MICROALBUMIN / CREATININE URINE RATIO
Creatinine, Urine: 250 mg/dL
Microalb/Creat Ratio: 7 mg/g creat (ref 0–29)
Microalbumin, Urine: 16.9 ug/mL

## 2020-05-31 LAB — LIPID PANEL
Chol/HDL Ratio: 2.4 ratio (ref 0.0–5.0)
Cholesterol, Total: 172 mg/dL (ref 100–199)
HDL: 71 mg/dL (ref >39)
LDL Chol Calc (NIH): 90 mg/dL (ref 0–99)
Triglycerides: 55 mg/dL (ref 0–149)
VLDL Cholesterol Cal: 11 mg/dL (ref 5–40)

## 2020-05-31 MED ORDER — HYDROCHLOROTHIAZIDE 12.5 MG PO CAPS: 25.0000 mg | ORAL_CAPSULE | Freq: Every day | ORAL | 2 refills | Status: DC

## 2020-05-31 NOTE — Assessment & Plan Note (Addendum)
A1C is 6.9 today.  Minimal proteinuria. Plan:  --Continue current management with 1000mg  metformin bid --f/u in 3 mo

## 2020-05-31 NOTE — Progress Notes (Signed)
Office Visit   Patient ID: Austin Anderson, male    DOB: [birth year], 69 y.o.   MRN: [id number]  Subjective:  CC: abdominal pain, sinus congestion  HPI 69 y.o. male with diverticulosis and recurrent diverticulitis who presents today for evaluation of abdominal pain. Began about 2w ago. Located periumbilcal. Denies fevers, chills, n/v, or diarrhea. He is blind so unable to know about blood in his stool. He is eating and drinking well. He notes this feels similar to his prior diverticulitis attacks.   He also is seeking evaluation for nasal congestion. He notes that he has congestion every year around this time however this is worse than before. He denies systemic symptoms, cough, sore throat, or ear pain but does note some fullness in his ears. Nasal drainage is clear mucous. He has been using a bunch of different sprays but is unsure of what they are. He has also been placing vick's rub in his nose.      ACTIVE MEDICATIONS   Current Outpatient Medications on File Prior to Visit  Medication Sig Dispense Refill  . albuterol (PROAIR HFA) 108 (90 Base) MCG/ACT inhaler Inhale 2 puffs into the lungs every 6 (six) hours as needed for wheezing or shortness of breath. TAKE 2 PUFFS BY MOUTH EVERY 6 HOURS AS NEEDED FOR WHEEZE 18 g 3  . allopurinol (ZYLOPRIM) 100 MG tablet TAKE 3 TABLETS BY MOUTH EVERY DAY 270 tablet 2  . atenolol (TENORMIN) 25 MG tablet Take 1 tablet (25 mg total) by mouth daily. 90 tablet 2  . atorvastatin (LIPITOR) 40 MG tablet Take 1 tablet (40 mg total) by mouth daily. 90 tablet 3  . fluticasone (FLONASE) 50 MCG/ACT nasal spray Place 1 spray daily as needed into both nostrils for allergies or rhinitis. 9.9 g 2  . losartan (COZAAR) 100 MG tablet Take 1 tablet (100 mg total) by mouth daily. 90 tablet 2  . metFORMIN (GLUCOPHAGE-XR) 500 MG 24 hr tablet Take 2 tablets (1,000 mg total) by mouth 2 (two) times daily. 360 tablet 1  . polyethylene glycol (MIRALAX / GLYCOLAX) packet TAKE 17 GM  (1 PACKET) MIXED IN LIQUID BY MOUTH DAILY. 84 packet 0  . senna-docusate (SENOKOT-S) 8.6-50 MG tablet Take 1 tablet by mouth daily. 7 tablet 0   No current facility-administered medications on file prior to visit.    ROS  Review of Systems  Constitutional: Negative for chills and fever.  HENT: Positive for congestion and sinus pressure. Negative for sinus pain, sore throat and tinnitus.   Respiratory: Negative for cough, chest tightness and shortness of breath.   Gastrointestinal: Positive for abdominal pain. Negative for constipation, diarrhea, nausea and vomiting.    Objective:   BP (!) 150/81 (BP Location: Left Arm, Patient Position: Sitting, Cuff Size: Small)   Pulse 69   Temp 99.1 F (37.3 C) (Oral)   Ht 5\' 5"  (1.651 m)   Wt 187 lb 14.4 oz (85.2 kg)   SpO2 100%   BMI 31.27 kg/m  Wt Readings from Last 3 Encounters:  05/30/20 187 lb 14.4 oz (85.2 kg)  11/20/19 192 lb 9.6 oz (87.4 kg)  10/13/19 191 lb 1.6 oz (86.7 kg)   BP Readings from Last 3 Encounters:  05/30/20 (!) 150/81  11/20/19 137/79  11/01/19 (!) 142/80   Physical Exam Constitutional:      Appearance: He is not ill-appearing or toxic-appearing.     Comments: Chronically ill appearing  HENT:     Nose:  Comments: No pain to palpation over frontal or maxillary sinuses. Nares appear inflamed and dry. No apparent drainage in them at time of exam. Cardiovascular:     Rate and Rhythm: Normal rate and regular rhythm.  Pulmonary:     Effort: Pulmonary effort is normal.     Breath sounds: Normal breath sounds.  Abdominal:     General: Bowel sounds are normal. There is no distension.     Palpations: Abdomen is soft.     Tenderness: There is no abdominal tenderness.  Skin:    General: Skin is warm and dry.     Health Maintenance:   Health Maintenance  Topic Date Due  . COVID-19 Vaccine (1) Never done  . FOOT EXAM  08/18/2019  . INFLUENZA VACCINE  04/14/2020  . HEMOGLOBIN A1C  08/29/2020  . LIPID  PANEL  05/30/2021  . PNA vac Low Risk Adult (2 of 2 - PPSV23) 10/15/2021  . COLONOSCOPY  10/12/2024  . TETANUS/TDAP  07/22/2027  . Hepatitis C Screening  Completed     Assessment & Plan:   Problem List Items Addressed This Visit      Cardiovascular and Mediastinum   Hypertension - Primary    Above goal in the clinic today. Chart review indicates it above goal at recent appts as well.currently on losartan 100mg  daily, hctz 12.5mg , atenolol 25mg  daily BMP stable today Plan --increase hctz to 25mg   --f/u 4-6w with repeat BMP       Relevant Medications   hydrochlorothiazide (MICROZIDE) 12.5 MG capsule   Other Relevant Orders   Basic metabolic panel (Completed)   Lipid panel (Completed)     Respiratory   Sinus congestion    Pt presenting for evaluation of sinus congestion.  Doubtful that this is a sinus infection but the augmentin for the diverticulitis should cover a sinus infection if he has one. Discussed nasal hygeine with him         Digestive   Diverticulosis    Pt is presenting for evaluation of abdominal pain. No systemic symptoms. Feels like his prior episodes of diverticulitis.  He is afebrile in the office today. His abdominal pain does not worsen with palpation. Not toxic appearing. Chart review indicates recurrent visits for similar sx. He gets a course of antibiotics and a couple days of pain medication and symptoms resolve.  I don't think that detailed imaging with CT is indicated today. Given his exam and hpi, I question if this is even diverticulitis but I do think his symptoms are a sufficient indication for a course of outpatient antibiotic treatment. Plan --augmentin x7d --3d supply of norco --f/u if sx do not resolve --return precautions discussed.        Endocrine   Diabetes mellitus, type 2 ([facility])    A1C is 6.9 today.  Minimal proteinuria. Plan:  --Continue current management with 1000mg  metformin bid --f/u in 3 mo       Relevant Orders    POC Hbg A1C (Completed)   Microalbumin / Creatinine Urine Ratio (Completed)   Lipid panel (Completed)        Pt discussed with Dr. Forde Radon, MD Internal Medicine Resident PGY-2 [facility] Pager: [phone] 05/31/2020 2:44 PM

## 2020-05-31 NOTE — Assessment & Plan Note (Signed)
Above goal in the clinic today. Chart review indicates it above goal at recent appts as well.currently on losartan 100mg  daily, hctz 12.5mg , atenolol 25mg  daily BMP stable today Plan --increase hctz to 25mg   --f/u 4-6w with repeat BMP

## 2020-05-31 NOTE — Assessment & Plan Note (Signed)
Pt is presenting for evaluation of abdominal pain. No systemic symptoms. Feels like his prior episodes of diverticulitis.  He is afebrile in the office today. His abdominal pain does not worsen with palpation. Not toxic appearing. Chart review indicates recurrent visits for similar sx. He gets a course of antibiotics and a couple days of pain medication and symptoms resolve.  I don't think that detailed imaging with CT is indicated today. Given his exam and hpi, I question if this is even diverticulitis but I do think his symptoms are a sufficient indication for a course of outpatient antibiotic treatment. Plan --augmentin x7d --3d supply of norco --f/u if sx do not resolve --return precautions discussed.

## 2020-05-31 NOTE — Assessment & Plan Note (Signed)
Pt presenting for evaluation of sinus congestion.  Doubtful that this is a sinus infection but the augmentin for the diverticulitis should cover a sinus infection if he has one. Discussed nasal hygeine with him

## 2020-06-05 NOTE — Progress Notes (Signed)
Internal Medicine Clinic Attending  Case discussed with Dr. Christian  At the time of the visit.  We reviewed the resident's history and exam and pertinent patient test results.  I agree with the assessment, diagnosis, and plan of care documented in the resident's note.  

## 2020-06-10 ENCOUNTER — Other Ambulatory Visit: Payer: Self-pay | Admitting: Internal Medicine

## 2020-06-10 DIAGNOSIS — E119 Type 2 diabetes mellitus without complications: Secondary | ICD-10-CM

## 2020-07-12 ENCOUNTER — Other Ambulatory Visit: Payer: Self-pay | Admitting: Internal Medicine

## 2020-07-12 DIAGNOSIS — I10 Essential (primary) hypertension: Secondary | ICD-10-CM

## 2020-07-17 ENCOUNTER — Ambulatory Visit (INDEPENDENT_AMBULATORY_CARE_PROVIDER_SITE_OTHER): Payer: Medicare Other | Admitting: Internal Medicine

## 2020-07-17 ENCOUNTER — Other Ambulatory Visit: Payer: Self-pay

## 2020-07-17 VITALS — BP 132/89 | HR 62 | Temp 98.1°F | Wt 181.7 lb

## 2020-07-17 DIAGNOSIS — I10 Essential (primary) hypertension: Secondary | ICD-10-CM

## 2020-07-17 DIAGNOSIS — R0981 Nasal congestion: Secondary | ICD-10-CM | POA: Diagnosis not present

## 2020-07-17 MED ORDER — FLUTICASONE PROPIONATE 50 MCG/ACT NA SUSP: 1.0000 | Freq: Every day | NASAL | 2 refills | Status: DC | PRN

## 2020-07-17 NOTE — Assessment & Plan Note (Addendum)
Patient's BP  BP Readings from Last 3 Encounters:  07/17/20 132/89  05/30/20 (!) 150/81  11/20/19 137/79   Goal of <130/80. The patient does not manage his own medications. He has a nursing aid. He recently had his HCTZ increased to 25 mg daily. He also has atenolol 25 mg and Losartan 100 mg listed. He doesn't believe he is taking the Losartan. I have called his nursing aid multiple times, but did not reach her. Patient gave me a number [phone].  He denies any light headiness, chest pain or dizziness.  Most recent renal function per BMP as below.  BMP Latest Ref Rng & Units 05/30/2020 10/13/2019 07/26/2019  Glucose 65 - 99 mg/dL 109(H) 106(H) 123(H)  BUN 8 - 27 mg/dL 15 20 20   Creatinine 0.76 - 1.27 mg/dL 1.31(H) 1.34(H) 1.48(H)  BUN/Creat Ratio 10 - 24 11 15 14   Sodium 134 - 144 mmol/L 142 144 138  Potassium 3.5 - 5.2 mmol/L 4.3 4.4 4.6  Chloride 96 - 106 mmol/L 103 103 101  CO2 20 - 29 mmol/L 23 24 24   Calcium 8.6 - 10.2 mg/dL 9.6 9.8 9.9   Medication changes: NO - going to verify his medications.   Assessment: Hypertension Plan: - Call patient's aid again tomorrow, and if I do not reach her I will call patient.  - Pending above - I would like to stop atenolol and continue Losartan and HCTZ at 25 mg daily. Ultimately would like to prescribe as a combination pill.  -Repeat BMP in 3 months

## 2020-07-17 NOTE — Progress Notes (Signed)
   CC: Hypertension and congestion  HPI:[name] is a 69 y.o. male who presents for evaluation of hypertension and congestion. Please see individual problem based A/P for details.  Depression, PHQ-9: Based on the patients    Office Visit from 07/17/2020 in [facility]  PHQ-9 Total Score 4     Total score suggest, Normal  Past Medical History:  Diagnosis Date  . Asthma    Reported by patient's last physician before coming to [facility].  No PFT's on file.   . Diabetes mellitus, type 2 ([place])   . Diverticulosis   . Gout   . Hypertension   . Prostate cancer [facility])    Prostatic adenocarcinoma, Gleason score 7 (3+4), s/p radical prostatectomy 12/04/10 by Dr. [name].    Review of Systems:   Review of Systems  Constitutional: Negative for chills and fever.  Respiratory: Negative for cough and shortness of breath.   Gastrointestinal: Negative for abdominal pain, constipation and diarrhea.     Physical Exam: Vitals:   07/17/20 0928  BP: 132/89  Pulse: 62  Temp: 98.1 F (36.7 C)  TempSrc: Oral  SpO2: 100%  Weight: 181 lb 11.2 oz (82.4 kg)     General: NAD, male sitting in wheelchair with dark sunglasses and hold blind walking stick HEENT: Normocephalic, atraumatic , Conjunctiva nl  Cardiovascular: Normal rate, regular rhythm.  No murmurs, rubs, or gallops Pulmonary : Equal breath sounds, No wheezes, rales, or rhonchi Abdominal: soft, nontender,  bowel sounds present   Assessment & Plan:   See Encounters Tab for problem based charting.  Patient discussed with Dr. Angelia Mould

## 2020-07-17 NOTE — Assessment & Plan Note (Signed)
Patient continues to have sinus congestion since last visit in september. Denies fever, runny nose, or sore throat. No rhinorrhea or tonsillar exudate on exam. Vaccinated for Covid -19, receiving Flu Vaccine .   Assessment: Nasal congestion Plan: - fluticasone (FLONASE) 50 MCG/ACT nasal spray; Place 1 spray into both nostrils daily as needed for allergies or rhinitis.  Dispense: 15.8 mL; Refill: 2

## 2020-07-17 NOTE — Patient Instructions (Signed)
Thank you, [name] for allowing [place] to provide your care today. Today we discussed nasal congestion and high blood pressure.    I have ordered the following labs for you:  Lab Orders  No laboratory test(s) ordered today     Tests ordered today:  None available  Referrals ordered today:   Referral Orders  No referral(s) requested today     I have ordered the following medication/changed the following medications:   Stop the following medications: Medications Discontinued During This Encounter  Medication Reason   fluticasone (FLONASE) 50 MCG/ACT nasal spray Reorder     Start the following medications: Meds ordered this encounter  Medications   fluticasone (FLONASE) 50 MCG/ACT nasal spray    Sig: Place 1 spray into both nostrils daily as needed for allergies or rhinitis.    Dispense:  15.8 mL    Refill:  2     Follow up: 3 months   Remember: Have your nurse check your medications and call clinic to report what you are taking  Should you have any questions or concerns please call the internal medicine clinic at [phone].      Tamsen Snider, M.D. [place]

## 2020-07-23 NOTE — Progress Notes (Signed)
Internal Medicine Clinic Attending  Case discussed with Dr. Steen  At the time of the visit.  We reviewed the resident's history and exam and pertinent patient test results.  I agree with the assessment, diagnosis, and plan of care documented in the resident's note.  

## 2020-08-05 ENCOUNTER — Telehealth (INDEPENDENT_AMBULATORY_CARE_PROVIDER_SITE_OTHER): Payer: Medicare Other

## 2020-08-05 DIAGNOSIS — K5790 Diverticulosis of intestine, part unspecified, without perforation or abscess without bleeding: Secondary | ICD-10-CM

## 2020-08-05 DIAGNOSIS — R1030 Lower abdominal pain, unspecified: Secondary | ICD-10-CM | POA: Diagnosis not present

## 2020-08-05 NOTE — Telephone Encounter (Signed)
Requesting antibiotic for diverticulitis, please call pt back.

## 2020-08-05 NOTE — Progress Notes (Signed)
  [facility] Telephone Encounter Continuity Care Appointment  HPI:   This telephone encounter was created for [name] on 08/05/2020 for the following purpose/cc requesting antibiotic.   Past Medical History:  Past Medical History:  Diagnosis Date  . Asthma    Reported by patient's last physician before coming to [facility].  No PFT's on file.   . Diabetes mellitus, type 2 ([place])   . Diverticulosis   . Gout   . Hypertension   . Prostate cancer [facility])    Prostatic adenocarcinoma, Gleason score 7 (3+4), s/p radical prostatectomy 12/04/10 by Dr. [name].       ROS:   Negative for fever or chills.  Positive for lower abdominal pain  Positive for loose stool   Negative for N/V   Assessment / Plan / Recommendations:  I called Austin Anderson regarding his request for "Abx for diverticulitis". He reports lower abdominal pain for past couple of days. No fever or chills. No N/V. Had some loose stool last night. He was seen in [facility] before for similar presentations and managed out patient Abx for presumed diverticulitis (He had diverticulosis on CT scan on 07/2019). He was referred to surgery once but per patient, no procedure recommended.  I explained to him, that he will need to be assessed before prescribing Abx or pain meds. He mentions that he needs transportation (Medicare transportation needs 3-4 days notice before arrangement).  Although, it might be a similar episode, he should be evaluated in person for abdominal pysical exam. Patient is blind and needs door to door transportation. Our staff arranged [place] transportation for 2 pm for pt tomorrow.  I discussed his case with [name], recommending to repeat imaging as patient has recurrent abdominal pain to rule out other etiology or to confirm diverticulitis as the etiology. Will order CT scan so Ms Chilon start the insurance authorization process so the pt can have the imaging done before  holiday.   As always, pt is advised that if symptoms worsen or new symptoms arise, they should go to an urgent care facility or to to ER for further evaluation.   Consent and Medical Decision Making:   Patient discussed with [name]  This is a telephone encounter between Austin Anderson and Austin Anderson on 08/05/2020 for asking for antibiotic for diverticulitis. The visit was conducted with the patient located at home and [facility] at [facility]. The patient's identity was confirmed using their DOB and current address. The patient has consented to being evaluated through a telephone encounter and understands the associated risks (an examination cannot be done and the patient may need to come in for an appointment) / benefits (allows the patient to remain at home, decreasing exposure to coronavirus). I personally spent 12 minutes on medical discussion.

## 2020-08-06 ENCOUNTER — Other Ambulatory Visit: Payer: Self-pay

## 2020-08-06 ENCOUNTER — Encounter: Payer: Self-pay | Admitting: Internal Medicine

## 2020-08-06 ENCOUNTER — Ambulatory Visit (INDEPENDENT_AMBULATORY_CARE_PROVIDER_SITE_OTHER): Payer: Medicare Other | Admitting: Internal Medicine

## 2020-08-06 VITALS — BP 124/97 | HR 82 | Temp 98.0°F | Ht 65.0 in | Wt 180.0 lb

## 2020-08-06 DIAGNOSIS — K5790 Diverticulosis of intestine, part unspecified, without perforation or abscess without bleeding: Secondary | ICD-10-CM

## 2020-08-06 DIAGNOSIS — R1030 Lower abdominal pain, unspecified: Secondary | ICD-10-CM | POA: Diagnosis not present

## 2020-08-06 LAB — CBC WITH DIFFERENTIAL/PLATELET
Abs Immature Granulocytes: 0.01 10*3/uL (ref 0.00–0.07)
Basophils Absolute: 0 10*3/uL (ref 0.0–0.1)
Basophils Relative: 0 %
Eosinophils Absolute: 0.1 10*3/uL (ref 0.0–0.5)
Eosinophils Relative: 2 %
HCT: 40.9 % (ref 39.0–52.0)
Hemoglobin: 13.2 g/dL (ref 13.0–17.0)
Immature Granulocytes: 0 %
Lymphocytes Relative: 26 %
Lymphs Abs: 1.5 10*3/uL (ref 0.7–4.0)
MCH: 29 pg (ref 26.0–34.0)
MCHC: 32.3 g/dL (ref 30.0–36.0)
MCV: 89.9 fL (ref 80.0–100.0)
Monocytes Absolute: 0.5 10*3/uL (ref 0.1–1.0)
Monocytes Relative: 9 %
Neutro Abs: 3.6 10*3/uL (ref 1.7–7.7)
Neutrophils Relative %: 63 %
Platelets: 234 10*3/uL (ref 150–400)
RBC: 4.55 MIL/uL (ref 4.22–5.81)
RDW: 15.2 % (ref 11.5–15.5)
WBC: 5.8 10*3/uL (ref 4.0–10.5)
nRBC: 0 % (ref 0.0–0.2)

## 2020-08-06 LAB — COMPREHENSIVE METABOLIC PANEL
ALT: 12 U/L (ref 0–44)
AST: 16 U/L (ref 15–41)
Albumin: 3.6 g/dL (ref 3.5–5.0)
Alkaline Phosphatase: 56 U/L (ref 38–126)
Anion gap: 13 (ref 5–15)
BUN: 10 mg/dL (ref 8–23)
CO2: 27 mmol/L (ref 22–32)
Calcium: 9.4 mg/dL (ref 8.9–10.3)
Chloride: 99 mmol/L (ref 98–111)
Creatinine, Ser: 1.32 mg/dL — ABNORMAL HIGH (ref 0.61–1.24)
GFR, Estimated: 59 mL/min — ABNORMAL LOW (ref >60)
Glucose, Bld: 124 mg/dL — ABNORMAL HIGH (ref 70–99)
Potassium: 3.8 mmol/L (ref 3.5–5.1)
Sodium: 139 mmol/L (ref 135–145)
Total Bilirubin: 1 mg/dL (ref 0.3–1.2)
Total Protein: 7.8 g/dL (ref 6.5–8.1)

## 2020-08-06 LAB — LIPASE, BLOOD: Lipase: 23 U/L (ref 11–51)

## 2020-08-06 MED ORDER — HYDROCODONE-ACETAMINOPHEN 5-325 MG PO TABS: 1.0000 | ORAL_TABLET | Freq: Three times a day (TID) | ORAL | 0 refills | Status: AC | PRN

## 2020-08-06 NOTE — Patient Instructions (Signed)
Thank you for allowing [place] to provide your care today. Today you came in for abdominal pain.  You are afebrile. Your pain might be due to diverticulosis that you have had. However, we need to rule out other possible reason. I order a Ct scan and blood work for you to see if there is evidence of infection and if you need antibiotic. Meanwhile I send some pain medication for you to be taken as needed. Be aware that this pain medication, may make you drowsy. It can also make you constipated, so take your stool softener with that.   I have ordered some labs for you. I will call if any are abnormal.    Today we made no other changes to your medications.    Please follow-up in clinic if no improvement or if your symptoms get worse.   As always, if having severe symptoms, please seek medical attention at emergency room.  Should you have any questions or concerns please call the internal medicine clinic at [phone].    Thank you

## 2020-08-06 NOTE — Progress Notes (Signed)
Acute Office Visit  Subjective:    Patient ID: Austin Anderson, male    DOB: 17-May-1951, 69 y.o.   MRN: [id number]  Chief Complaint  Patient presents with  . Abdominal Pain    lower   . Bloated    HPI Patient is in today for abdominal pain.  Please refer to problem based charting for further details and assessment and plan.  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  Medications: Albuterol, allopurinol, atenolol, atorvastatin, HCTZ, losartan, Metformin, headache, senna docusate.  Past Medical History:  Diagnosis Date  . Asthma    Reported by patient's last physician before coming to [facility].  No PFT's on file.   . Diabetes mellitus, type 2 ([place])   . Diverticulosis   . Gout   . Hypertension   . Prostate cancer [facility])    Prostatic adenocarcinoma, Gleason score 7 (3+4), s/p radical prostatectomy 12/04/10 by Dr. [name].     History reviewed. No pertinent surgical history.  Family History  Problem Relation Age of Onset  . Heart attack Father 65  . Heart disease Mother   . Diabetes Sister     Social History   Socioeconomic History  . Marital status: Single    Spouse name: Not on file  . Number of children: Not on file  . Years of education: Not on file  . Highest education level: Not on file  Occupational History  . Not on file  Tobacco Use  . Smoking status: Former Smoker    Types: Cigarettes  . Smokeless tobacco: Never Used  . Tobacco comment: 30 years ago  Substance and Sexual Activity  . Alcohol use: No    Alcohol/week: 0.0 standard drinks  . Drug use: No  . Sexual activity: Not on file  Other Topics Concern  . Not on file  Social History Narrative   The patient lives alone in [place].  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  The patient is on medicaid/medicare, and receives a disability check.  The patient has a  sister in [place].  The patient has a 10th grade education.  The patient was diagnosed with Juvenile Glaucoma at age 70, but it was advanced at that time.  The patient lost his vision at age 38, and now can only barely see light, but no colors or shapes.  The patient has an aide who comes to his house every week to help him sort his medications.   Social Determinants of Health   Financial Resource Strain:   . Difficulty of Paying Living Expenses: Not on file  Food Insecurity:   . Worried About Charity fundraiser in the Last Year: Not on file  . Ran Out of Food in the Last Year: Not on file  Transportation Needs:   . Lack of Transportation (Medical): Not on file  . Lack of Transportation (Non-Medical): Not on file  Physical Activity:   . Days of Exercise per Week: Not on file  . Minutes of Exercise per Session: Not on file  Stress:   . Feeling of Stress : Not on file  Social Connections:   . Frequency of Communication with Friends and Family: Not on file  . Frequency of Social Gatherings with Friends and Family: Not on file  . Attends Religious Services: Not on file  . Active Member of Clubs or Organizations: Not on  file  . Attends Archivist Meetings: Not on file  . Marital Status: Not on file  Intimate Partner Violence:   . Fear of Current or Ex-Partner: Not on file  . Emotionally Abused: Not on file  . Physically Abused: Not on file  . Sexually Abused: Not on file    Outpatient Medications Prior to Visit  Medication Sig Dispense Refill  . albuterol (PROAIR HFA) 108 (90 Base) MCG/ACT inhaler Inhale 2 puffs into the lungs every 6 (six) hours as needed for wheezing or shortness of breath. TAKE 2 PUFFS BY MOUTH EVERY 6 HOURS AS NEEDED FOR WHEEZE 18 g 3  . allopurinol (ZYLOPRIM) 100 MG tablet TAKE 3 TABLETS BY MOUTH EVERY DAY 270 tablet 2  . atenolol (TENORMIN) 25 MG tablet TAKE 1 TABLET BY MOUTH EVERY DAY 90 tablet 2  . atorvastatin (LIPITOR) 40 MG tablet Take 1 tablet  (40 mg total) by mouth daily. 90 tablet 3  . fluticasone (FLONASE) 50 MCG/ACT nasal spray Place 1 spray into both nostrils daily as needed for allergies or rhinitis. 15.8 mL 2  . hydrochlorothiazide (MICROZIDE) 12.5 MG capsule Take 2 capsules (25 mg total) by mouth daily. 180 capsule 2  . losartan (COZAAR) 100 MG tablet Take 1 tablet (100 mg total) by mouth daily. 90 tablet 2  . metFORMIN (GLUCOPHAGE-XR) 500 MG 24 hr tablet TAKE 2 TABLETS BY MOUTH TWICE A DAY 360 tablet 1  . polyethylene glycol (MIRALAX / GLYCOLAX) packet TAKE 17 GM (1 PACKET) MIXED IN LIQUID BY MOUTH DAILY. 84 packet 0  . senna-docusate (SENOKOT-S) 8.6-50 MG tablet Take 1 tablet by mouth daily. 7 tablet 0   No facility-administered medications prior to visit.    No Known Allergies  Review of Systems  Constitutional: Negative for chills and fever.  Gastrointestinal: Positive for abdominal pain and constipation. Negative for blood in stool, diarrhea and vomiting.  Neurological: Negative for dizziness.       Objective:    BP (!) 124/97 (BP Location: Left Arm, Patient Position: Sitting, Cuff Size: Large)   Pulse 82   Temp 98 F (36.7 C) (Oral)   Ht 5\' 5"  (1.651 m)   Wt 180 lb (81.6 kg)   SpO2 100% Comment: room air  BMI 29.95 kg/m  Wt Readings from Last 3 Encounters:  08/06/20 180 lb (81.6 kg)  07/17/20 181 lb 11.2 oz (82.4 kg)  05/30/20 187 lb 14.4 oz (85.2 kg)   Physical Exam Vitals reviewed.  Eyes:     Comments: Blind  Cardiovascular:     Rate and Rhythm: Normal rate.     Heart sounds: Normal heart sounds.  Pulmonary:     Effort: Pulmonary effort is normal.     Breath sounds: Normal breath sounds. No wheezing or rales.  Abdominal:     General: Bowel sounds are decreased.     Palpations: Abdomen is soft.     Tenderness: There is abdominal tenderness in the right lower quadrant, suprapubic area and left lower quadrant. There is no right CVA tenderness, left CVA tenderness or guarding.     Comments: Mild  tenderness to deep palpation of lower abdomen. BS are mildly decreased.       Health Maintenance Due  Topic Date Due  . COVID-19 Vaccine (1) Never done  . FOOT EXAM  08/18/2019  . INFLUENZA VACCINE  04/14/2020    There are no preventive care reminders to display for this patient.   No results found for: TSH Lab Results  Component Value Date   WBC 5.8 08/06/2020   HGB 13.2 08/06/2020   HCT 40.9 08/06/2020   MCV 89.9 08/06/2020   PLT 234 08/06/2020   Lab Results  Component Value Date   NA 139 08/06/2020   K 3.8 08/06/2020   CO2 27 08/06/2020   GLUCOSE 124 (H) 08/06/2020   BUN 10 08/06/2020   CREATININE 1.32 (H) 08/06/2020   BILITOT 1.0 08/06/2020   ALKPHOS 56 08/06/2020   AST 16 08/06/2020   ALT 12 08/06/2020   PROT 7.8 08/06/2020   ALBUMIN 3.6 08/06/2020   CALCIUM 9.4 08/06/2020   ANIONGAP 13 08/06/2020   Lab Results  Component Value Date   CHOL 172 05/30/2020   Lab Results  Component Value Date   HDL 71 05/30/2020   Lab Results  Component Value Date   LDLCALC 90 05/30/2020   Lab Results  Component Value Date   TRIG 55 05/30/2020   Lab Results  Component Value Date   CHOLHDL 2.4 05/30/2020   Lab Results  Component Value Date   HGBA1C 6.9 (A) 05/30/2020       Assessment & Plan:   Problem List Items Addressed This Visit      Digestive   Diverticulosis    Patient presented with abdominal pain (recommended to come for in-person evaluation after called the office yesterday and asked for Abx).  He has 4-6/10 lower abdomina pain. Afebrile and no abdominal exam benign except mild tenderness to deep palpation of lower abdomen. No leucocytosis on stat CBC. No finding today to warrant Abx therapy or admission. Ordering a CT scan to rule out other etiologies of abdominal pain. Please read the details under "abdomin pain"        Relevant Medications   HYDROcodone-acetaminophen (NORCO) 5-325 MG tablet     Other   Abdominal pain - Primary     Patient presented with abdominal pain (recommended to come for in-person evaluation after called the office yesterday and asked for Abx. because he thinks this is similar to his diverticulitis episodes he had before. He has 4-6/10 lower abdomina pain. Afebrile and no abdominal exam benign except mild tenderness to deep palpation of lower abdomen. His abdominal pain can be due to constipation (pt thinks he had enough bowel movement yesterday and after taking stool softener though), vs mild diverticulitis. Can not rule out other etiologies such as pancreatitis (his pain is mostly at lower abdomen though). No UTI symptoms and no CVA tenderness. His pain is not related to eating so I am not suspicious to ischemic pain. He had an incidental finding of iliac artery dissection on Ct scan before that evaluated by vascular surgeon, Dr. Carlis Abbott and reported to be stable. We will consider this if the pain gets worse or any clinical decompensation happens but his current pain appears to be similar to prior episodes, intermittent and mild-moderate and no relevant finding on p/E so I do not think we need to approach it as a disection pain currently.  -CBC -Lipase -CMP -CT abdomen w contrast  ADDENDUM labs are unremarkable. No leucocytosis on stat CBC. Lipase negative and LFT nl. No finding today to warrant Abx therapy or admission. Ordering a CT scan to rule out other etiology.  -F/u CT scan -Ok to give another short course of Norco without refill meanwhile.        Relevant Orders   CBC with Diff (Completed)   CMP w Anion Gap (STAT/Sunquest-performed on-site) (Completed)   Lipase, blood (STAT) (Completed)  Meds ordered this encounter  Medications  . HYDROcodone-acetaminophen (NORCO) 5-325 MG tablet    Sig: Take 1 tablet by mouth every 8 (eight) hours as needed for up to 5 days for moderate pain.    Dispense:  15 tablet    Refill:  0   Patient discussed with [name]. Dewayne Hatch, MD

## 2020-08-07 ENCOUNTER — Encounter: Payer: Self-pay | Admitting: Internal Medicine

## 2020-08-07 NOTE — Assessment & Plan Note (Addendum)
Patient presented with abdominal pain (recommended to come for in-person evaluation after called the office yesterday and asked for Abx. because he thinks this is similar to his diverticulitis episodes he had before. He has 4-6/10 lower abdomina pain. Afebrile and no abdominal exam benign except mild tenderness to deep palpation of lower abdomen. His abdominal pain can be due to constipation (pt thinks he had enough bowel movement yesterday and after taking stool softener though), vs mild diverticulitis. Can not rule out other etiologies such as pancreatitis (his pain is mostly at lower abdomen though). No UTI symptoms and no CVA tenderness. His pain is not related to eating so I am not suspicious to ischemic pain. He had an incidental finding of iliac artery dissection on Ct scan before that evaluated by vascular surgeon, Dr. Carlis Abbott and reported to be stable. We will consider this if the pain gets worse or any clinical decompensation happens but his current pain appears to be similar to prior episodes, intermittent and mild-moderate and no relevant finding on p/E so I do not think we need to approach it as a disection pain currently.  -CBC -Lipase -CMP -CT abdomen w contrast  ADDENDUM labs are unremarkable. No leucocytosis on stat CBC. Lipase negative and LFT nl. No finding today to warrant Abx therapy or admission. Ordering a CT scan to rule out other etiology.  -F/u CT scan -Ok to give another short course of Norco without refill meanwhile.

## 2020-08-07 NOTE — Assessment & Plan Note (Addendum)
Patient presented with abdominal pain (recommended to come for in-person evaluation after called the office yesterday and asked for Abx).  He has 4-6/10 lower abdomina pain. Afebrile and no abdominal exam benign except mild tenderness to deep palpation of lower abdomen. No leucocytosis on stat CBC. No finding today to warrant Abx therapy or admission. Ordering a CT scan to rule out other etiologies of abdominal pain. Please read the details under "abdomin pain"

## 2020-08-12 ENCOUNTER — Ambulatory Visit (HOSPITAL_COMMUNITY): Payer: Medicare Other

## 2020-08-12 NOTE — Progress Notes (Signed)
Internal Medicine Clinic Attending  Case discussed with Dr. Myrtie Hawk  At the time of the visit.  We reviewed the resident's history and exam and pertinent patient test results.  I agree with the assessment, diagnosis, and plan of care documented in the resident's note.   Patient complaining of recurrent diverticulitis, requesting antibiotics and narcotic pain medication. He has had multiple episodes over the past year always treated empirically with the same. Previously seen by GI for this who recommended evaluation by general surgery if episodes persist. Last CT abdomen in 2020 showed diverticulosis without evidence of diverticulitis. He was asked to come in person today for evaluation. Per Dr. Myrtie Hawk, he is non toxic appearing. Vitals and labs today are reassuring. Plan is for CT abdomen to confirm diagnosis and evaluate for complications. Will hold off on antibiotics for now, agree with short course of pain mediation.

## 2020-08-13 ENCOUNTER — Ambulatory Visit (HOSPITAL_COMMUNITY)
Admission: RE | Admit: 2020-08-13 | Discharge: 2020-08-13 | Disposition: A | Discharge status: Home / Self Care | Payer: Medicare Other | Source: Ambulatory Visit | Attending: Internal Medicine | Admitting: Internal Medicine

## 2020-08-13 ENCOUNTER — Other Ambulatory Visit: Payer: Self-pay

## 2020-08-13 DIAGNOSIS — R1030 Lower abdominal pain, unspecified: Secondary | ICD-10-CM | POA: Insufficient documentation

## 2020-08-13 DIAGNOSIS — N261 Atrophy of kidney (terminal): Secondary | ICD-10-CM | POA: Diagnosis not present

## 2020-08-13 DIAGNOSIS — I7779 Dissection of other artery: Secondary | ICD-10-CM | POA: Diagnosis not present

## 2020-08-13 DIAGNOSIS — N281 Cyst of kidney, acquired: Secondary | ICD-10-CM | POA: Diagnosis not present

## 2020-08-13 DIAGNOSIS — K5732 Diverticulitis of large intestine without perforation or abscess without bleeding: Secondary | ICD-10-CM | POA: Diagnosis not present

## 2020-08-13 MED ORDER — IOHEXOL 300 MG/ML  SOLN
100.0000 mL | Freq: Once | INTRAMUSCULAR | Status: AC | PRN
  Administered 2020-08-13: 100 mL via INTRAVENOUS

## 2020-08-16 ENCOUNTER — Other Ambulatory Visit: Payer: Self-pay | Admitting: Internal Medicine

## 2020-08-16 DIAGNOSIS — E119 Type 2 diabetes mellitus without complications: Secondary | ICD-10-CM

## 2020-08-16 DIAGNOSIS — K5790 Diverticulosis of intestine, part unspecified, without perforation or abscess without bleeding: Secondary | ICD-10-CM

## 2020-08-16 MED ORDER — AMOXICILLIN-POT CLAVULANATE 875-125 MG PO TABS: 1.0000 | ORAL_TABLET | Freq: Three times a day (TID) | ORAL | 0 refills | Status: AC

## 2020-08-16 NOTE — Progress Notes (Signed)
I called Mr. Baca and informed him about CT result. Abdominal CT showed sigmoid diverticulitis w/o complication. He mentions that he feels kind of better. His pain is controled with 1 Norco a day. He was able to tolerate PO. No change in his bowel movement. (Takes stool softener once a day to prevent constipation). He has mild symptoms, tolerate PO intake and we can manage his diverticulitis out pt  (He was afebrile last visit and his CBC did not show leukocytosis). He is agreeable to come to clinic next week if needed.  Prescribing Po Augmenting and instructed to come back to clinic if no improvement or worsening/get a tele visit for follow up.

## 2020-10-09 ENCOUNTER — Other Ambulatory Visit: Payer: Self-pay | Admitting: Student

## 2020-10-09 ENCOUNTER — Other Ambulatory Visit: Payer: Self-pay | Admitting: Internal Medicine

## 2020-10-09 DIAGNOSIS — M109 Gout, unspecified: Secondary | ICD-10-CM

## 2020-10-09 DIAGNOSIS — R0981 Nasal congestion: Secondary | ICD-10-CM

## 2020-10-09 DIAGNOSIS — I10 Essential (primary) hypertension: Secondary | ICD-10-CM

## 2020-10-15 ENCOUNTER — Encounter: Payer: Self-pay | Admitting: *Deleted

## 2020-10-15 NOTE — Progress Notes (Signed)

## 2020-10-18 ENCOUNTER — Other Ambulatory Visit: Payer: Self-pay | Admitting: Internal Medicine

## 2020-10-18 DIAGNOSIS — E119 Type 2 diabetes mellitus without complications: Secondary | ICD-10-CM

## 2020-10-23 NOTE — Progress Notes (Signed)
Things That May Be Affecting Your Health:  Alcohol  Hearing loss  Pain    Depression  Home Safety  Sexual Health   Diabetes  Lack of physical activity  Stress  x Difficulty with daily activities  Loneliness  Tiredness   Drug use  Medicines ? Tobacco use   Falls  Visual merchandiser  Weight   Food choices  Oral Health  Other    YOUR PERSONALIZED HEALTH PLAN : 1. Schedule your next subsequent Medicare Wellness visit in one year 2. Attend all of your regular appointments to address your medical issues 3. Complete the preventative screenings and services   Annual Wellness Visit   Medicare Covered Preventative Screenings and [place] Men and Women Who How Often Need? Date of Last Service Action  Abdominal Aortic Aneurysm Adults with AAA risk factors Once  CT abdomen 08/13/20   Alcohol Misuse and Counseling All Adults      Bone Density Measurement  Adults at risk for osteoporosis Once every 2 yrs     Lipid Panel Z13.6 All adults without CV disease Once every 5 yrs  05/30/20   Colorectal Cancer   Stool sample or  Colonoscopy All adults 68 and older   Once every year  Every 10 years  05/30/20   Depression All Adults Once a year  Today   Diabetes Screening Blood glucose, post glucose load, or GTT Z13.1  All adults at risk  Pre-diabetics  Once per year  Twice per year     Diabetes  Self-Management Training All adults Diabetics 10 hrs first year; 2 hours subsequent years. Requires Copay     Glaucoma  Diabetics  Family history of glaucoma  African Americans 61 yrs +  Hispanic Americans 73 yrs + Annually - requires coppay     Hepatitis C Z72.89 or F19.20  High Risk for HCV  Born between 1945 and 1965  Annually  Once  10/25/2016   HIV Z11.4 All adults based on risk  Annually btw ages 62 & 48 regardless of risk  Annually > 65 yrs if at increased risk  10/25/2016   Lung Cancer Screening Asymptomatic adults aged 42-77 with 30 pack yr history and  current smoker OR quit within the last 15 yrs Annually Must have counseling and shared decision making documentation before first screen 30 pack years. Establish quit date, if he quit in the last 15 years or current smoker he should be offered screening.     Medical Nutrition Therapy Adults with   Diabetes  Renal disease  Kidney transplant within past 3 yrs 3 hours first year; 2 hours subsequent years     Obesity and Counseling All adults Screening once a year Counseling if BMI 30 or higher  Today   Tobacco Use Counseling Adults who use tobacco  Up to 8 visits in one year ? Smoking history    Vaccines Z23  Hepatitis B  Influenza   Pneumonia  Adults   Once  Once every flu season  Two different vaccines separated by one year     Next Annual Wellness Visit People with Medicare Every year  Today     [place] Women Who How Often Need  Date of Last Service Action  Mammogram  Z12.31 Women over 40 One baseline ages 66-39. Annually ager 40 yrs+     Pap tests All women Annually if high risk. Every 2 yrs for normal risk women     Screening for cervical  cancer with   Pap (Z01.419 nl or Z01.411abnl) &  HPV Z11.51 Women aged 45 to 10 Once every 5 yrs     Screening pelvic and breast exams All women Annually if high risk. Every 2 yrs for normal risk women     Sexually Transmitted Diseases  Chlamydia  Gonorrhea  Syphilis All at risk adults Annually for non pregnant females at increased risk         [place] Men Who How Ofter Need  Date of Last Service Action  Prostate Cancer - DRE & PSA Men over 50 Annually.  DRE might require a copay. Can offer to patient 07/26/2019   Sexually Transmitted Diseases  Syphilis All at risk adults Annually for men at increased risk

## 2020-11-14 ENCOUNTER — Other Ambulatory Visit: Payer: Self-pay | Admitting: Student

## 2020-11-14 DIAGNOSIS — M109 Gout, unspecified: Secondary | ICD-10-CM

## 2020-11-29 ENCOUNTER — Other Ambulatory Visit: Payer: Self-pay | Admitting: Internal Medicine

## 2020-11-29 DIAGNOSIS — J45909 Unspecified asthma, uncomplicated: Secondary | ICD-10-CM

## 2020-12-01 ENCOUNTER — Other Ambulatory Visit: Payer: Self-pay | Admitting: Student

## 2020-12-01 DIAGNOSIS — M109 Gout, unspecified: Secondary | ICD-10-CM

## 2020-12-02 NOTE — Telephone Encounter (Signed)
[facility]- Pt already picked up medication from local pharmacy Per pt-he's ok with using [facility] for now and will call back prior to prescription running out, if he wants [facility] to [facility] rx to [facility].  No further action needed, phone call complete.Despina Hidden Cassady3/21/20229:48 AM

## 2021-01-05 ENCOUNTER — Other Ambulatory Visit: Payer: Self-pay | Admitting: Student

## 2021-01-05 DIAGNOSIS — R0981 Nasal congestion: Secondary | ICD-10-CM

## 2021-01-09 ENCOUNTER — Encounter: Payer: Self-pay | Admitting: Student

## 2021-01-09 ENCOUNTER — Ambulatory Visit (INDEPENDENT_AMBULATORY_CARE_PROVIDER_SITE_OTHER): Payer: Medicare Other | Admitting: Student

## 2021-01-09 ENCOUNTER — Other Ambulatory Visit: Payer: Self-pay

## 2021-01-09 VITALS — BP 128/92 | HR 69 | Temp 98.2°F | Wt 182.3 lb

## 2021-01-09 DIAGNOSIS — R1032 Left lower quadrant pain: Secondary | ICD-10-CM | POA: Diagnosis not present

## 2021-01-09 DIAGNOSIS — I10 Essential (primary) hypertension: Secondary | ICD-10-CM

## 2021-01-09 DIAGNOSIS — E119 Type 2 diabetes mellitus without complications: Secondary | ICD-10-CM

## 2021-01-09 DIAGNOSIS — C61 Malignant neoplasm of prostate: Secondary | ICD-10-CM

## 2021-01-09 DIAGNOSIS — N182 Chronic kidney disease, stage 2 (mild): Secondary | ICD-10-CM

## 2021-01-09 DIAGNOSIS — K5790 Diverticulosis of intestine, part unspecified, without perforation or abscess without bleeding: Secondary | ICD-10-CM | POA: Diagnosis not present

## 2021-01-09 LAB — POCT GLYCOSYLATED HEMOGLOBIN (HGB A1C): Hemoglobin A1C: 6.5 % — AB (ref 4.0–5.6)

## 2021-01-09 LAB — GLUCOSE, CAPILLARY: Glucose-Capillary: 114 mg/dL — ABNORMAL HIGH (ref 70–99)

## 2021-01-09 MED ORDER — AMOXICILLIN-POT CLAVULANATE 875-125 MG PO TABS: 1.0000 | ORAL_TABLET | Freq: Two times a day (BID) | ORAL | 0 refills | Status: AC

## 2021-01-09 MED ORDER — METFORMIN HCL 1000 MG PO TABS
1000.0000 mg | ORAL_TABLET | Freq: Two times a day (BID) | ORAL | 3 refills | Status: DC
Start: 2021-01-09 — End: 2021-07-10

## 2021-01-09 NOTE — Progress Notes (Signed)
   CC: Abdominal discomfort/swelling  HPI:  [name] is a 70 y.o. male with PMH as below who presents to clinic for evaluation of recurrent diverticulitis. Please see problem based charting for evaluation, assessment and plan.  Past Medical History:  Diagnosis Date  . Asthma    Reported by patient's last physician before coming to [facility].  No PFT's on file.   . Diabetes mellitus, type 2 ([place])   . Diverticulosis   . Gout   . Hypertension   . Prostate cancer [facility])    Prostatic adenocarcinoma, Gleason score 7 (3+4), s/p radical prostatectomy 12/04/10 by Dr. [name].     Review of Systems:  Constitutional: Negative for fever, dizziness, chills or fatigue Eyes: Negative for visual changes Respiratory: Negative for shortness of breath Abdomen: Positive for abdominal discomfort, nausea and constipation. Negative for vomiting, bloody stools or diarrhea Neuro: Negative for headache or weakness  Physical Exam: General: Pleasant, wheelchair-bound blind male. No acute distress. Cardiac: RRR. No murmurs, rubs or gallops. No LE edema Respiratory: Lungs CTAB. No wheezing or crackles. Abdominal: Soft. Moderate distention. Mild diffuse tenderness to palpation. Normal BS.  No guarding.  Skin: Warm, dry and intact without rashes or lesions Extremities: Atraumatic. Full ROM. Pulse palpable. Neuro: A&O x 3. Moves all extremities Psych: Appropriate mood and affect.  Vitals:   01/09/21 0931  BP: (!) 128/92  Pulse: 69  Temp: 98.2 F (36.8 C)  TempSrc: Oral  SpO2: 99%  Weight: 182 lb 4.8 oz (82.7 kg)    Assessment & Plan:   See Encounters Tab for problem based charting.  Patient discussed with Dr. Lockie Pares, MD, MPH

## 2021-01-09 NOTE — Patient Instructions (Addendum)
Thank you, Austin Anderson for allowing [place] to provide your care today. Today we discussed your diabetes, blood pressure and stomach pain.    I have ordered the following labs for you:   Lab Orders     Glucose, capillary     CMP14 + Anion Gap     CBC with Diff     PSA     POC Hbg A1C   I will call if any are abnormal. All of your labs can be accessed through "My Chart".   I have ordered the following tests:  I have ordered the following medication/changed the following medications:  1. Start Augmentin 875-125 mg twice daily for 7 days  Please follow-up in 6 months or as needed  Should you have any questions or concerns please call the internal medicine clinic at [phone].    Linwood Dibbles, MD, MPH [facility]   My Chart Access: [email or web address]   If you have not already done so, please get your COVID 19 vaccine  To schedule an appointment for a COVID vaccine choice any of the following: Go to [email or web address]   Go to [email or web address]                  Call [phone]                                     Call [phone] and select Option 2

## 2021-01-10 ENCOUNTER — Encounter: Payer: Self-pay | Admitting: Student

## 2021-01-10 LAB — CMP14 + ANION GAP
ALT: 14 IU/L (ref 0–44)
AST: 16 IU/L (ref 0–40)
Albumin/Globulin Ratio: 1.5 (ref 1.2–2.2)
Albumin: 4.4 g/dL (ref 3.8–4.8)
Alkaline Phosphatase: 50 IU/L (ref 44–121)
Anion Gap: 20 mmol/L — ABNORMAL HIGH (ref 10.0–18.0)
BUN/Creatinine Ratio: 16 (ref 10–24)
BUN: 23 mg/dL (ref 8–27)
Bilirubin Total: 0.4 mg/dL (ref 0.0–1.2)
CO2: 22 mmol/L (ref 20–29)
Calcium: 9.7 mg/dL (ref 8.6–10.2)
Chloride: 97 mmol/L (ref 96–106)
Creatinine, Ser: 1.42 mg/dL — ABNORMAL HIGH (ref 0.76–1.27)
Globulin, Total: 3 g/dL (ref 1.5–4.5)
Glucose: 101 mg/dL — ABNORMAL HIGH (ref 65–99)
Potassium: 4.4 mmol/L (ref 3.5–5.2)
Sodium: 139 mmol/L (ref 134–144)
Total Protein: 7.4 g/dL (ref 6.0–8.5)
eGFR: 54 mL/min/{1.73_m2} — ABNORMAL LOW (ref >59)

## 2021-01-10 LAB — CBC WITH DIFFERENTIAL/PLATELET
Basophils Absolute: 0 10*3/uL (ref 0.0–0.2)
Basos: 1 %
EOS (ABSOLUTE): 0.3 10*3/uL (ref 0.0–0.4)
Eos: 5 %
Hematocrit: 42.8 % (ref 37.5–51.0)
Hemoglobin: 13.6 g/dL (ref 13.0–17.7)
Immature Grans (Abs): 0 10*3/uL (ref 0.0–0.1)
Immature Granulocytes: 0 %
Lymphocytes Absolute: 2.1 10*3/uL (ref 0.7–3.1)
Lymphs: 33 %
MCH: 28.1 pg (ref 26.6–33.0)
MCHC: 31.8 g/dL (ref 31.5–35.7)
MCV: 88 fL (ref 79–97)
Monocytes Absolute: 0.5 10*3/uL (ref 0.1–0.9)
Monocytes: 7 %
Neutrophils Absolute: 3.6 10*3/uL (ref 1.4–7.0)
Neutrophils: 54 %
Platelets: 254 10*3/uL (ref 150–450)
RBC: 4.84 x10E6/uL (ref 4.14–5.80)
RDW: 14.2 % (ref 11.6–15.4)
WBC: 6.5 10*3/uL (ref 3.4–10.8)

## 2021-01-10 LAB — PSA: Prostate Specific Ag, Serum: <0.1 ng/mL (ref 0.0–4.0)

## 2021-01-10 NOTE — Assessment & Plan Note (Signed)
Creatinine slightly up to 1.42 with a GFR of 54. Patient instructed to stay hydrated. Continue metformin for now and monitor kidney function closely. --Can consider discontinuing metformin with progression of his CKD at next office visit.

## 2021-01-10 NOTE — Assessment & Plan Note (Signed)
Repeat PSA <0.1. Patient denies any urinary symptoms. --Repeat PSA in 2 years

## 2021-01-10 NOTE — Assessment & Plan Note (Signed)
Patient with a history of diverticulosis and recurrent diverticulitis here for evaluation of abdominal discomfort and swelling. Patient states he has had abdominal discomfort for a few days and believes his diverticulitis is back. He endorses associated constipation and nausea but denies any fever, chills, bloody stools, diarrhea or vomiting. Patient has some mild diffuse tenderness on exam but overall nontoxic appearing and has no guarding. CBC with normal white count. No signs to indicate diverticular perforation or abscess. Antibiotics has been successful in treating symptoms in the past so will prescribe 7 days course of Augmentin.  Plan: --Augmentin 875-125, twice daily for 7 days --Continue MiraLAX as needed for constipation --Return precautions given

## 2021-01-10 NOTE — Assessment & Plan Note (Signed)
Diabetes currently well controlled.  A1c improved to 6.5 today from 6.9 seven months ago.  Plan: --Continue metformin 1000 mg twice daily --Continue lifestyle modifications --Repeat A1c in 6 months

## 2021-01-10 NOTE — Progress Notes (Signed)
Internal Medicine Clinic Attending  Case discussed with Dr. Amponsah  At the time of the visit.  We reviewed the resident's history and exam and pertinent patient test results.  I agree with the assessment, diagnosis, and plan of care documented in the resident's note.  

## 2021-02-04 ENCOUNTER — Other Ambulatory Visit: Payer: Self-pay | Admitting: Internal Medicine

## 2021-02-04 DIAGNOSIS — I10 Essential (primary) hypertension: Secondary | ICD-10-CM

## 2021-02-05 NOTE — Telephone Encounter (Signed)
I have changed his Rx from 2 x 12.5mg  tablets to 1 x 25mg  tablet daily, called patient and discussed this.

## 2021-04-27 ENCOUNTER — Other Ambulatory Visit: Payer: Self-pay | Admitting: Student

## 2021-04-27 DIAGNOSIS — I10 Essential (primary) hypertension: Secondary | ICD-10-CM

## 2021-05-08 ENCOUNTER — Ambulatory Visit (INDEPENDENT_AMBULATORY_CARE_PROVIDER_SITE_OTHER): Payer: Medicare Other | Admitting: Internal Medicine

## 2021-05-08 VITALS — BP 141/87 | HR 80 | Temp 98.6°F | Wt 179.5 lb

## 2021-05-08 DIAGNOSIS — K5790 Diverticulosis of intestine, part unspecified, without perforation or abscess without bleeding: Secondary | ICD-10-CM

## 2021-05-08 DIAGNOSIS — I10 Essential (primary) hypertension: Secondary | ICD-10-CM

## 2021-05-08 DIAGNOSIS — I7772 Dissection of iliac artery: Secondary | ICD-10-CM

## 2021-05-08 DIAGNOSIS — E119 Type 2 diabetes mellitus without complications: Secondary | ICD-10-CM

## 2021-05-08 LAB — GLUCOSE, CAPILLARY: Glucose-Capillary: 128 mg/dL — ABNORMAL HIGH (ref 70–99)

## 2021-05-08 LAB — POCT GLYCOSYLATED HEMOGLOBIN (HGB A1C): Hemoglobin A1C: 6.3 % — AB (ref 4.0–5.6)

## 2021-05-08 MED ORDER — AMOXICILLIN-POT CLAVULANATE 875-125 MG PO TABS: 1.0000 | ORAL_TABLET | Freq: Two times a day (BID) | ORAL | 0 refills | Status: AC

## 2021-05-08 MED ORDER — OLMESARTAN MEDOXOMIL 20 MG PO TABS: 20.0000 mg | ORAL_TABLET | Freq: Every day | ORAL | 1 refills | Status: DC

## 2021-05-08 MED ORDER — HYDROCHLOROTHIAZIDE 25 MG PO TABS: 25.0000 mg | ORAL_TABLET | Freq: Every day | ORAL | 3 refills | Status: DC

## 2021-05-08 MED ORDER — FIBERCON 625 MG PO TABS: 625.0000 mg | ORAL_TABLET | Freq: Every day | ORAL | 1 refills | Status: DC

## 2021-05-08 NOTE — Patient Instructions (Signed)
1. Type 2 diabetes mellitus without complication, without long-term current use of insulin ([place]) - Continue Metformon, controlled - POC Hbg A1C - Microalbumin / Creatinine Urine Ratio  2. Diverticulosis Start fiber.Follow up with Dr.Clark to check your external iliac vein dissection.  - polycarbophil (FIBERCON) 625 MG tablet; Take 1 tablet (625 mg total) by mouth daily.  Dispense: 90 tablet; Refill: 1 - amoxicillin-clavulanate (AUGMENTIN) 875-125 MG tablet; Take 1 tablet by mouth 2 (two) times daily for 7 days.  Dispense: 14 tablet; Refill: 0  3. Primary hypertension Change in medications today. Stop Losartan. Stop Atenolol. Continue Hydrochlorothiazide. Start new medication below. Follow up in 4 - 6 weeks.  - BMP8+Anion Gap - olmesartan (BENICAR) 20 MG tablet; Take 1 tablet (20 mg total) by mouth daily.  Dispense: 30 tablet; Refill: 1 - hydrochlorothiazide (HYDRODIURIL) 25 MG tablet; Take 1 tablet (25 mg total) by mouth daily.  Dispense: 90 tablet; Refill: 3

## 2021-05-08 NOTE — Progress Notes (Signed)
   CC: Type 2 diabetes mellitus , Diverticulosis, primary hypertension  HPI:Austin Anderson is a 70 y.o. male who presents for evaluation of type 2 diabetes mellitus , diverticulosis, and primary hypertension. Please see individual problem based A/P for details.  Past Medical History:  Diagnosis Date   Asthma    Reported by patient's last physician before coming to [facility].  No PFT's on file.    Diabetes mellitus, type 2 ([place])    Diverticulosis    Gout    Hypertension    Prostate cancer [facility])    Prostatic adenocarcinoma, Gleason score 7 (3+4), s/p radical prostatectomy 12/04/10 by Dr. [name].    Review of Systems:   Review of Systems  Constitutional:  Negative for fever and weight loss.  Gastrointestinal:  Positive for abdominal pain and constipation. Negative for melena.    Physical Exam: Vitals:   05/08/21 0952  BP: (!) 141/87  Pulse: 80  Temp: 98.6 F (37 C)  TempSrc: Oral  SpO2: 98%  Weight: 179 lb 8 oz (81.4 kg)   General: Sitting in wheel chair, NAD, has walking stick he use as he is blind, wearing dark shades.  Cardiovascular: Normal rate, regular rhythm.  No murmurs, rubs, or gallops Pulmonary : Equal breath sounds, No wheezes, rales, or rhonchi Abdominal: soft, mild tenderness on suprapubic palpation Ext: No edema in lower extremities, no tenderness to palpation of lower extremities. 2+ PT pulse  Assessment & Plan:   See Encounters Tab for problem based charting.  Patient discussed with Dr. Dareen Piano

## 2021-05-09 LAB — MICROALBUMIN / CREATININE URINE RATIO
Creatinine, Urine: 248.4 mg/dL
Microalb/Creat Ratio: 15 mg/g creat (ref 0–29)
Microalbumin, Urine: 37 ug/mL

## 2021-05-09 LAB — BMP8+ANION GAP
Anion Gap: 19 mmol/L — ABNORMAL HIGH (ref 10.0–18.0)
BUN/Creatinine Ratio: 14 (ref 10–24)
BUN: 17 mg/dL (ref 8–27)
CO2: 24 mmol/L (ref 20–29)
Calcium: 10.1 mg/dL (ref 8.6–10.2)
Chloride: 98 mmol/L (ref 96–106)
Creatinine, Ser: 1.24 mg/dL (ref 0.76–1.27)
Glucose: 106 mg/dL — ABNORMAL HIGH (ref 65–99)
Potassium: 3.9 mmol/L (ref 3.5–5.2)
Sodium: 141 mmol/L (ref 134–144)
eGFR: 63 mL/min/{1.73_m2} (ref >59)

## 2021-05-11 ENCOUNTER — Encounter: Payer: Self-pay | Admitting: Internal Medicine

## 2021-05-11 NOTE — Assessment & Plan Note (Signed)
Patient past due for one year follow up with vascular surgery to evaluate iliac artery disection. Referral placed for follow up.

## 2021-05-11 NOTE — Assessment & Plan Note (Addendum)
HPI:  Patient is having mild symptoms on my exam and discussed guidelines suggest patient will improve without antibiotics. He has found relieve with antibiotics and patient aware of risk of Cdiff. Patient is addiment he needs antibiotics. We will treat today with antibiotics, but will avoid opioid pain medications which would worsen constipation. He is going to follow up with vascular surgery for incidental finding of iliac artery dissection. We have referred him to surgery previously, patient would not like to see surgery at this time. Discussed bowel regimen. Patient does not like taking powder fiber supplement. Will prescribe a fiber pill.  Assessment/Plan: Recurrent diverticulosis. - polycarbophil (FIBERCON) 625 MG tablet; Take 1 tablet (625 mg total) by mouth daily.  Dispense: 90 tablet; Refill: 1 - amoxicillin-clavulanate (AUGMENTIN) 875-125 MG tablet; Take 1 tablet by mouth 2 (two) times daily for 7 days.  Dispense: 14 tablet; Refill: 0

## 2021-05-11 NOTE — Assessment & Plan Note (Signed)
HPI Hgb A1c 6.5 % at the last visit. Current A1c 6.3 %.  The patient denied polyuria, polydipsia, confusion, nausea, vomiting or diaphoresis.   Assessment/Plan: Type 2 diabetes mellitus,  controlled Continue Metformin 1000 mg twice daily Repeat A1c at next visit if after 3 months

## 2021-05-11 NOTE — Assessment & Plan Note (Signed)
HPI: Patient currently taking atenolol and HCTZ. Reports a few episodes of light headiness going from sitting to standing .  Orthostatics taking this appointment : Sitting 113/84 P 76, Standing 109/85 P -82, Standing for 3 minutes 121/89 P82.   Assessment/Plan: Controlled HTN, with symptoms of orthostatic hypotension at home, not reproduced in clinic. Patient has CKD stage 3a, will discontinue atenolol. Continue HCTZ and add low dose olmesartan. Patient should follow up in 4 -6 weeks to have BP checked and labs.  - olmesartan (BENICAR) 20 MG tablet; Take 1 tablet (20 mg total) by mouth daily.  Dispense: 30 tablet; Refill: 1

## 2021-05-12 NOTE — Progress Notes (Signed)
Internal Medicine Clinic Attending  Case discussed with Dr. Steen  At the time of the visit.  We reviewed the resident's history and exam and pertinent patient test results.  I agree with the assessment, diagnosis, and plan of care documented in the resident's note.  

## 2021-05-14 ENCOUNTER — Other Ambulatory Visit: Payer: Self-pay

## 2021-05-14 DIAGNOSIS — I7772 Dissection of iliac artery: Secondary | ICD-10-CM

## 2021-05-22 ENCOUNTER — Encounter: Payer: Self-pay | Admitting: Physician Assistant

## 2021-05-22 ENCOUNTER — Ambulatory Visit (HOSPITAL_COMMUNITY)
Admission: RE | Admit: 2021-05-22 | Discharge: 2021-05-22 | Disposition: A | Discharge status: Home / Self Care | Payer: Medicare Other | Source: Ambulatory Visit | Attending: Vascular Surgery | Admitting: Vascular Surgery

## 2021-05-22 ENCOUNTER — Other Ambulatory Visit: Payer: Self-pay

## 2021-05-22 ENCOUNTER — Ambulatory Visit (INDEPENDENT_AMBULATORY_CARE_PROVIDER_SITE_OTHER): Payer: Medicare Other | Admitting: Physician Assistant

## 2021-05-22 VITALS — BP 136/88 | HR 84 | Temp 98.4°F | Resp 16 | Ht 65.0 in | Wt 180.0 lb

## 2021-05-22 DIAGNOSIS — I7772 Dissection of iliac artery: Secondary | ICD-10-CM

## 2021-05-22 NOTE — Progress Notes (Signed)
History of Present Illness:  Patient is a 70 y.o. 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  The CT was for further work-up of his abdominal pain and suspected diverticulitis.  He denise symptoms of claudication, non healing wounds or rest pain.  Past Medical History:  Diagnosis Date   Asthma    Reported by patient's last physician before coming to [facility].  No PFT's on file.    Diabetes mellitus, type 2 ([place])    Diverticulosis    Gout    Hypertension    Prostate cancer [facility])    Prostatic adenocarcinoma, Gleason score 7 (3+4), s/p radical prostatectomy 12/04/10 by Dr. [name].     History reviewed. No pertinent surgical history.   Social History Social History   Tobacco Use   Smoking status: Former    Types: Cigarettes   Smokeless tobacco: Never   Tobacco comments:    30 years ago  Substance Use Topics   Alcohol use: No    Alcohol/week: 0.0 standard drinks   Drug use: No    Family History Family History  Problem Relation Age of Onset   Heart attack Father 32   Heart disease Mother    Diabetes Sister     Allergies  No Known Allergies   Current Outpatient Medications  Medication Sig Dispense Refill   albuterol (VENTOLIN HFA) 108 (90 Base) MCG/ACT inhaler INHALE 2 PUFFS INTO THE LUNGS EVERY 6 (SIX) HOURS AS NEEDED FOR WHEEZING OR SHORTNESS OF BREATH. 8.5 each 3   allopurinol (ZYLOPRIM) 100 MG tablet TAKE 3 TABLETS BY MOUTH EVERY DAY 270 tablet 2   atorvastatin (LIPITOR) 40 MG tablet TAKE 1 TABLET BY MOUTH  DAILY 90 tablet 3   fluticasone (FLONASE) 50 MCG/ACT nasal spray PLACE 1 SPRAY INTO BOTH NOSTRILS DAILY AS NEEDED FOR ALLERGIES OR RHINITIS. 48 mL 1   hydrochlorothiazide (HYDRODIURIL) 25 MG tablet Take 1 tablet (25 mg total) by mouth daily. 90 tablet 3   metFORMIN (GLUCOPHAGE) 1000 MG tablet Take 1 tablet (1,000 mg total) by mouth 2 (two) times daily with a meal. 90 tablet 3   olmesartan (BENICAR)  20 MG tablet Take 1 tablet (20 mg total) by mouth daily. 30 tablet 1   polycarbophil (FIBERCON) 625 MG tablet Take 1 tablet (625 mg total) by mouth daily. 90 tablet 1   No current facility-administered medications for this visit.    ROS:   General:  No weight loss, Fever, chills  HEENT: No recent headaches, no nasal bleeding, no visual changes, no sore throat  Neurologic: No dizziness, blackouts, seizures. No recent symptoms of stroke or mini- stroke. No recent episodes of slurred speech, or temporary blindness.  Cardiac: No recent episodes of chest pain/pressure, no shortness of breath at rest.  No shortness of breath with exertion.  Denies history of atrial fibrillation or irregular heartbeat  Vascular: No history of rest pain in feet.  No history of claudication.  No history of non-healing ulcer, No history of DVT   Pulmonary: No home oxygen, no productive cough, no hemoptysis,  No asthma or wheezing  Musculoskeletal:  '[ ]'$  Arthritis, '[ ]'$  Low back pain,  '[ ]'$  Joint pain  Hematologic:No history of hypercoagulable state.  No history of easy bleeding.  No history of anemia  Gastrointestinal: No hematochezia or melena,  No gastroesophageal reflux, no trouble swallowing  Urinary: '[ ]'$  chronic Kidney disease, '[ ]'$  on HD - '[ ]'$  MWF or '[ ]'$   TTHS, '[ ]'$  Burning with urination, '[ ]'$  Frequent urination, '[ ]'$  Difficulty urinating;   Skin: No rashes  Psychological: No history of anxiety,  No history of depression   Physical Examination  Vitals:   05/22/21 0849  BP: 136/88  Pulse: 84  Resp: 16  Temp: 98.4 F (36.9 C)  TempSrc: Temporal  SpO2: 98%  Weight: 180 lb (81.6 kg)  Height: '5\' 5"'$  (1.651 m)    Body mass index is 29.95 kg/m.  General:  Alert and oriented, no acute distress HEENT: Normal Neck: No bruit or JVD Pulmonary: Clear to auscultation bilaterally Cardiac: Regular Rate and Rhythm without murmur Gastrointestinal: Soft, non-tender, non-distended, no mass, no scars Skin: No  rash Extremity Pulses:  2+ radial, brachial, femoral, dorsalis pedis,  pulses bilaterally Musculoskeletal: No deformity or edema  Neurologic: Upper and lower extremity motor 5/5 and symmetric  DATA:  Abdominal Aorta Findings:  +-------------+-------+----------+----------+--------+--------+--------+  Location     AP (cm)Trans (cm)PSV (cm/s)WaveformThrombusComments  +-------------+-------+----------+----------+--------+--------+--------+  Proximal     1.81   2.05      81                                  +-------------+-------+----------+----------+--------+--------+--------+  Mid          1.77   1.79      90                                  +-------------+-------+----------+----------+--------+--------+--------+  Distal       1.66   1.92      85                                  +-------------+-------+----------+----------+--------+--------+--------+  RT CIA Prox  1.2    1.2       66                                  +-------------+-------+----------+----------+--------+--------+--------+  LT CIA Prox  1.1    1.3       98                                  +-------------+-------+----------+----------+--------+--------+--------+  LT EIA Mid                    66                                  +-------------+-------+----------+----------+--------+--------+--------+  LT EIA Distal                 60                                  +-------------+-------+----------+----------+--------+--------+--------+   Left external iliac is patent with small flap identified consistent with  dissection.   ASSESSMENT:  Patent left external iliac artery with findings consistent  with dissection. He remains asymptomatic without claudication, non healing wounds or rest pain.     PLAN: He will continue daily activity as tolerates.  If he develops symptoms of  ischemia he will call, otherwise he will f/u in 1 year for repeat Aortoiliac duplex.      Roxy Horseman PA-C [facility] Office: [phone]  MD in clinic [place]

## 2021-05-31 ENCOUNTER — Other Ambulatory Visit: Payer: Self-pay | Admitting: Internal Medicine

## 2021-05-31 DIAGNOSIS — I10 Essential (primary) hypertension: Secondary | ICD-10-CM

## 2021-06-28 ENCOUNTER — Other Ambulatory Visit: Payer: Self-pay | Admitting: Internal Medicine

## 2021-06-28 DIAGNOSIS — I10 Essential (primary) hypertension: Secondary | ICD-10-CM

## 2021-07-06 ENCOUNTER — Other Ambulatory Visit: Payer: Self-pay | Admitting: Student

## 2021-07-06 DIAGNOSIS — E119 Type 2 diabetes mellitus without complications: Secondary | ICD-10-CM

## 2021-07-07 NOTE — Telephone Encounter (Signed)
Just spoke with patient.  He agreed to an appointment for next Wednesday 07/16/21 at 2:45 pm with Dr. Court Joy.

## 2021-07-16 ENCOUNTER — Other Ambulatory Visit: Payer: Self-pay

## 2021-07-16 ENCOUNTER — Encounter: Payer: Self-pay | Admitting: Internal Medicine

## 2021-07-16 ENCOUNTER — Ambulatory Visit (INDEPENDENT_AMBULATORY_CARE_PROVIDER_SITE_OTHER): Payer: Medicare Other | Admitting: Internal Medicine

## 2021-07-16 VITALS — BP 135/94 | HR 81 | Temp 97.5°F | Ht 65.0 in | Wt 175.7 lb

## 2021-07-16 DIAGNOSIS — Z23 Encounter for immunization: Secondary | ICD-10-CM | POA: Diagnosis not present

## 2021-07-16 DIAGNOSIS — I10 Essential (primary) hypertension: Secondary | ICD-10-CM

## 2021-07-16 DIAGNOSIS — K5904 Chronic idiopathic constipation: Secondary | ICD-10-CM

## 2021-07-16 MED ORDER — POLYETHYLENE GLYCOL 3350 17 GM/SCOOP PO POWD: ORAL | 1 refills | Status: DC

## 2021-07-16 MED ORDER — OLMESARTAN MEDOXOMIL 20 MG PO TABS: 20.0000 mg | ORAL_TABLET | Freq: Every day | ORAL | 1 refills | Status: DC

## 2021-07-16 NOTE — Patient Instructions (Signed)
Thank you for trusting me with your care. To recap, today we discussed the following:   1. Primary hypertension - BMP8+Anion Gap - olmesartan (BENICAR) 20 MG tablet; Take 1 tablet (20 mg total) by mouth daily.  Dispense: 90 tablet; Refill: 1  2. Chronic idiopathic constipation - Stop fiber tablet - polyethylene glycol powder (MIRALAX) 17 GM/SCOOP powder; Take one scoop as needed for constipation  Dispense: 255 g; Refill: 1

## 2021-07-16 NOTE — Progress Notes (Signed)
   CC: Hypertension and chronic idiopathic constipation  HPI:Austin Anderson is a 70 y.o. male who presents for evaluation of Hypertension and chronic idiopathic constipation. Please see individual problem based A/P for details.   Past Medical History:  Diagnosis Date   Asthma    Reported by patient's last physician before coming to [facility].  No PFT's on file.    Diabetes mellitus, type 2 ([place])    Diverticulosis    Gout    Hypertension    Prostate cancer [facility])    Prostatic adenocarcinoma, Gleason score 7 (3+4), s/p radical prostatectomy 12/04/10 by Dr. [name].    Review of Systems:   Review of Systems  Constitutional:  Negative for chills and fever.  Gastrointestinal:  Positive for constipation. Negative for abdominal pain.    Physical Exam: Vitals:   07/16/21 1409  BP: (!) 135/94  Pulse: 81  Temp: (!) 97.5 F (36.4 C)  TempSrc: Oral  SpO2: 100%  Weight: 175 lb 11.2 oz (79.7 kg)  Height: 5\' 5"  (1.651 m)     General: NAD, Has a long white blind stick Cardiovascular: Normal rate, regular rhythm.  No murmurs, rubs, or gallops Pulmonary : Equal breath sounds, No wheezes, rales, or rhonchi Abdominal: soft, nontender,  bowel sounds present Ext: No edema in lower extremities, no tenderness to palpation of lower extremities.   Assessment & Plan:   See Encounters Tab for problem based charting.  Patient discussed with Dr. Daryll Drown

## 2021-07-17 LAB — BMP8+ANION GAP
Anion Gap: 15 mmol/L (ref 10.0–18.0)
BUN/Creatinine Ratio: 14 (ref 10–24)
BUN: 17 mg/dL (ref 8–27)
CO2: 25 mmol/L (ref 20–29)
Calcium: 9.8 mg/dL (ref 8.6–10.2)
Chloride: 102 mmol/L (ref 96–106)
Creatinine, Ser: 1.25 mg/dL (ref 0.76–1.27)
Glucose: 108 mg/dL — ABNORMAL HIGH (ref 70–99)
Potassium: 4 mmol/L (ref 3.5–5.2)
Sodium: 142 mmol/L (ref 134–144)
eGFR: 63 mL/min/{1.73_m2} (ref >59)

## 2021-07-20 ENCOUNTER — Encounter: Payer: Self-pay | Admitting: Internal Medicine

## 2021-07-20 DIAGNOSIS — Z23 Encounter for immunization: Secondary | ICD-10-CM | POA: Insufficient documentation

## 2021-07-20 DIAGNOSIS — K5900 Constipation, unspecified: Secondary | ICD-10-CM | POA: Insufficient documentation

## 2021-07-20 NOTE — Assessment & Plan Note (Signed)
Patient was tried on fiber tablet and reports no improvement. Stools are bristol stool chart type 1 and 2. He self treats constipation with over the counter medications. He is using Maalox frequently. We discussed this medication is not for constipation and not recommended to use everyday. He is up to date on colorectal cancer screening.  Assessment/Plan:Chronic constipation, idiopathic. He will start Miralax. He does not want to take the fiber tablet. Encourage eating foods with fiber and at least 2 liters of water intake per day.

## 2021-07-20 NOTE — Assessment & Plan Note (Signed)
Need for immunization against influenza - Flu Vaccine QUAD High Dose(Fluad)polyethylene glycol powder (MIRALAX) 17 GM/SCOOP powder; Take one scoop as needed for constipation  Dispense: 255 g; Refill: 1

## 2021-07-21 ENCOUNTER — Other Ambulatory Visit: Payer: Self-pay | Admitting: Internal Medicine

## 2021-07-21 DIAGNOSIS — E119 Type 2 diabetes mellitus without complications: Secondary | ICD-10-CM

## 2021-07-21 NOTE — Progress Notes (Signed)
Internal Medicine Clinic Attending  Case discussed with Dr. Steen  At the time of the visit.  We reviewed the resident's history and exam and pertinent patient test results.  I agree with the assessment, diagnosis, and plan of care documented in the resident's note.  

## 2021-08-03 IMAGING — CT CT ABD-PELV W/ CM
2 of 4 series · 17 of 46 positions shown, 19 images · IV contrast (Omni 300)
Comparison: 04/14/2019

CLINICAL DATA: Abdominal pain, diverticulitis suspected

EXAM:
CT ABDOMEN AND PELVIS WITH CONTRAST
TECHNIQUE: Multidetector CT imaging of the abdomen and pelvis was performed
using the standard protocol following bolus administration of
intravenous contrast.
CONTRAST:  100mL OMNIPAQUE IOHEXOL 300 MG/ML  SOLN

[Series 3: a/p w/ 5mm · axial · 0.95mm/px · z∈[+904,+1284]mm · 14 of 86 slices shown, 16 images]
[im 5/86  soft-tissue]
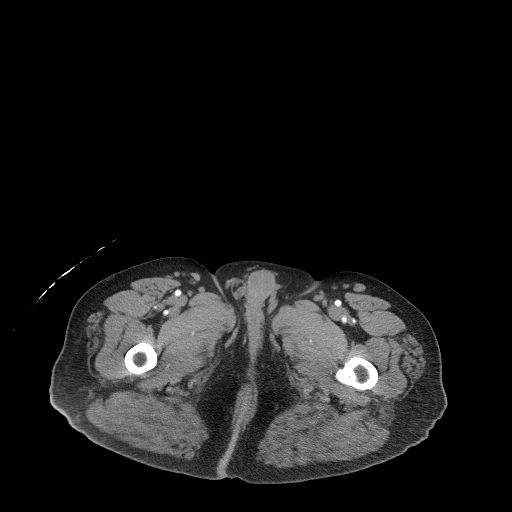
[im 5/86  bone]
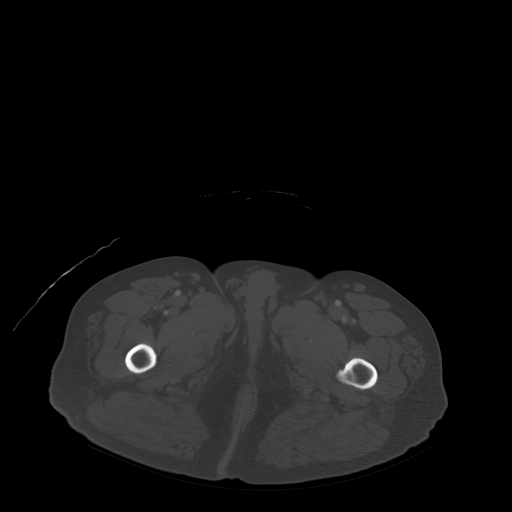
[im 13/86  soft-tissue]
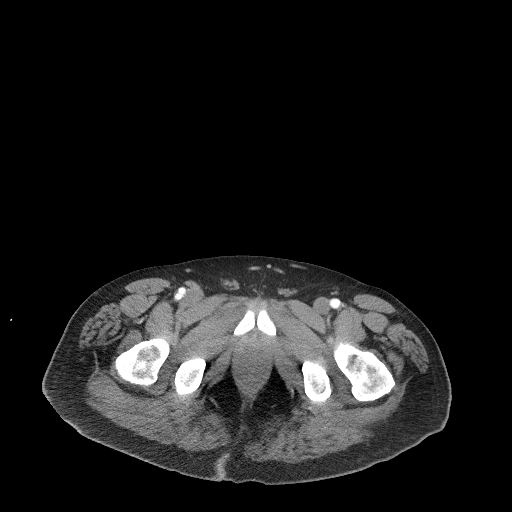
[im 17/86  soft-tissue]
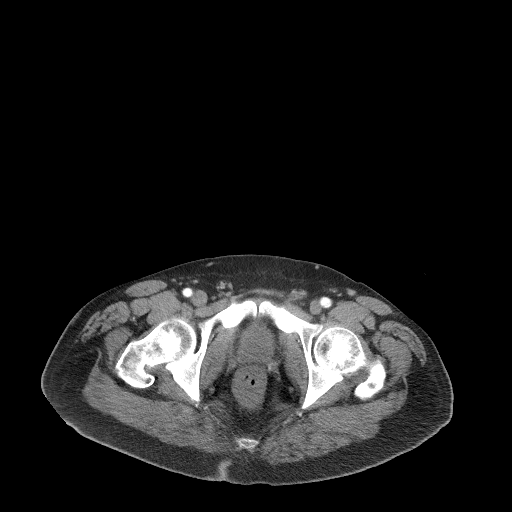
[im 25/86  soft-tissue]
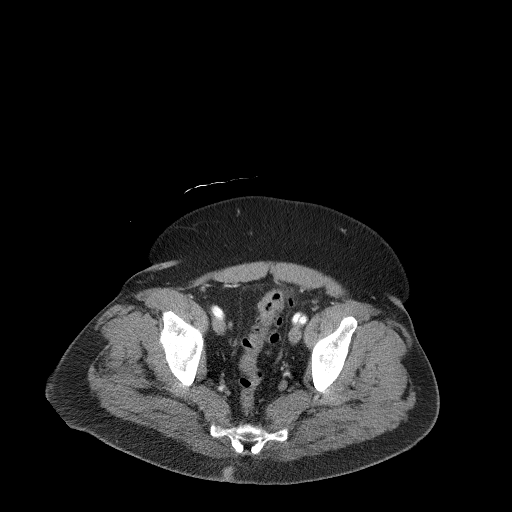
[im 29/86  soft-tissue]
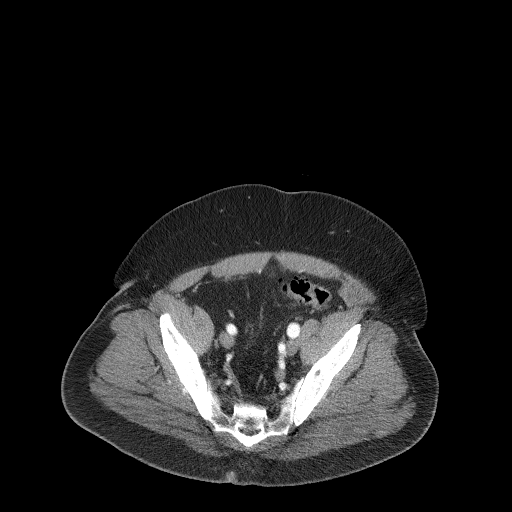
[im 33/86  soft-tissue]
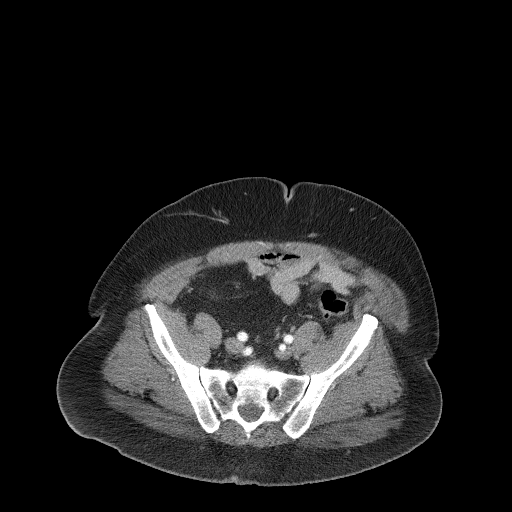
[im 41/86  soft-tissue]
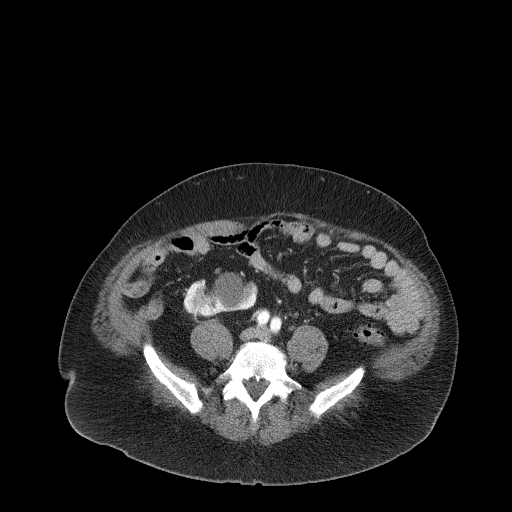
[im 45/86  soft-tissue]
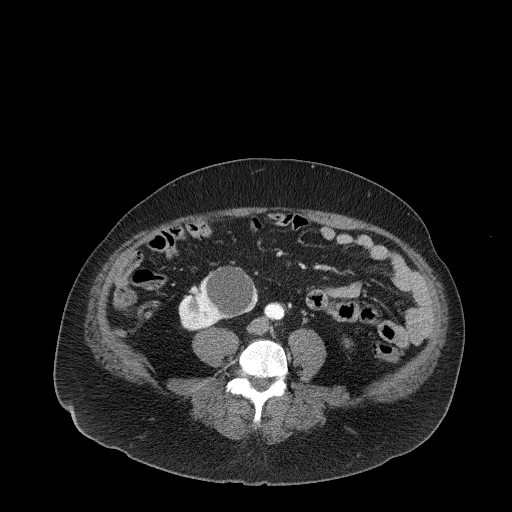
[im 53/86  soft-tissue]
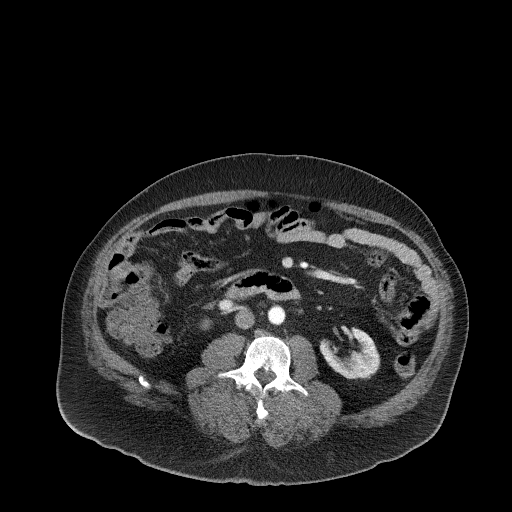
[im 53/86  bone]
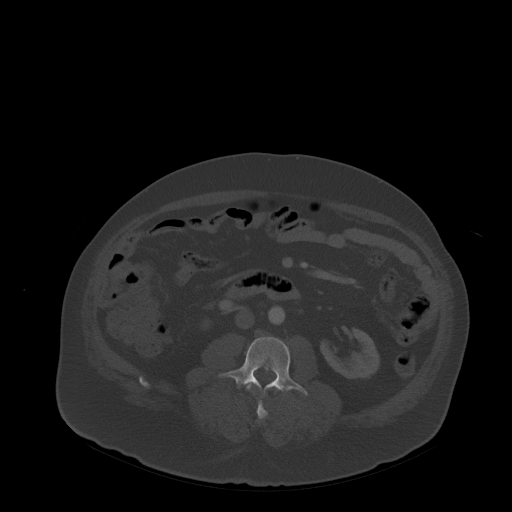
[im 57/86  soft-tissue]
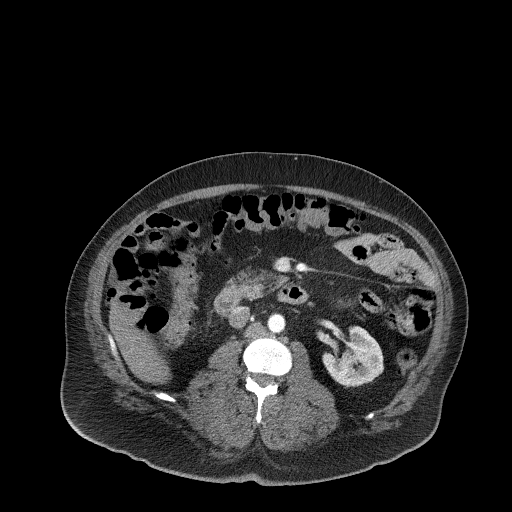
[im 65/86  soft-tissue]
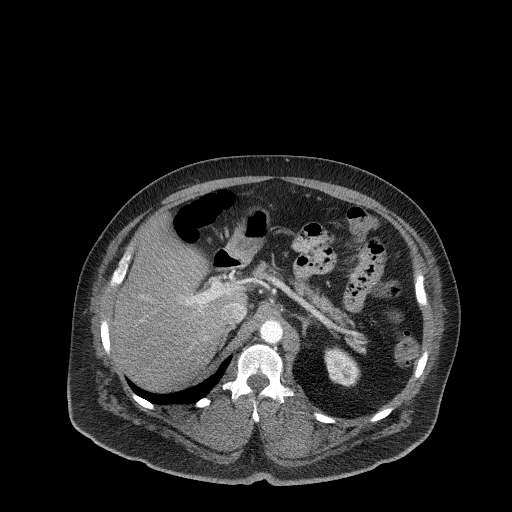
[im 69/86  soft-tissue]
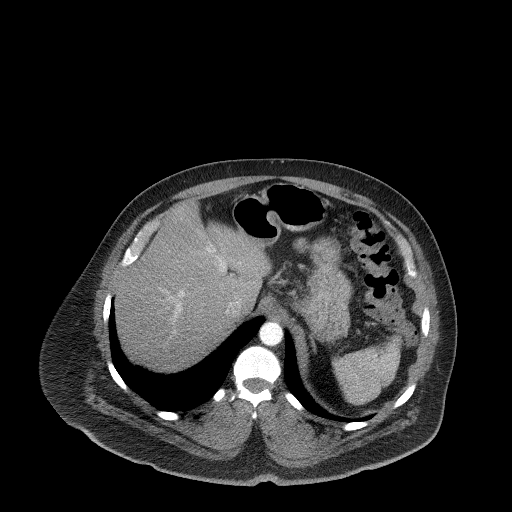
[im 73/86  soft-tissue]
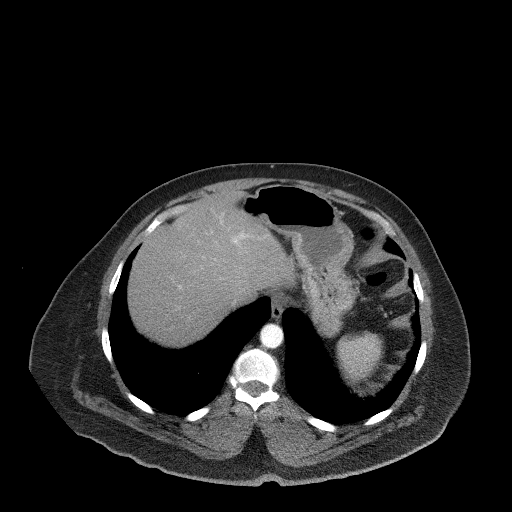
[im 81/86  soft-tissue]
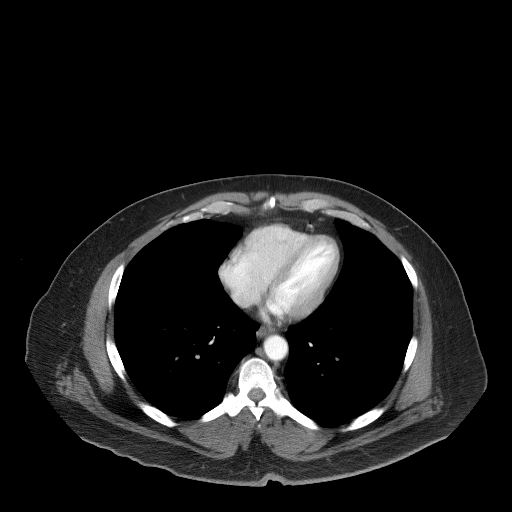

[Series 6: a/p w/ cor · coronal · 0.84mm/px · 3 of 178 slices shown]
[im 60/178  soft-tissue]
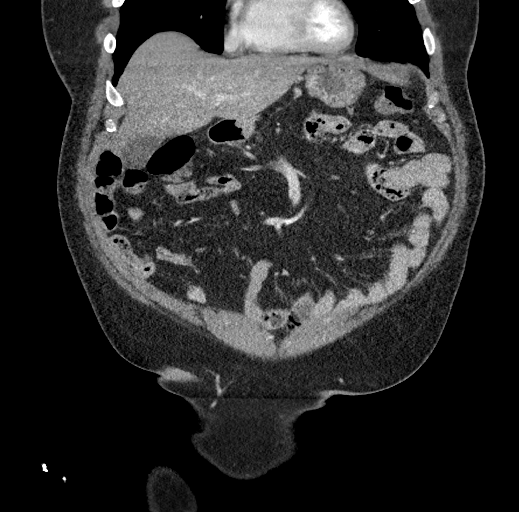
[im 79/178  soft-tissue]
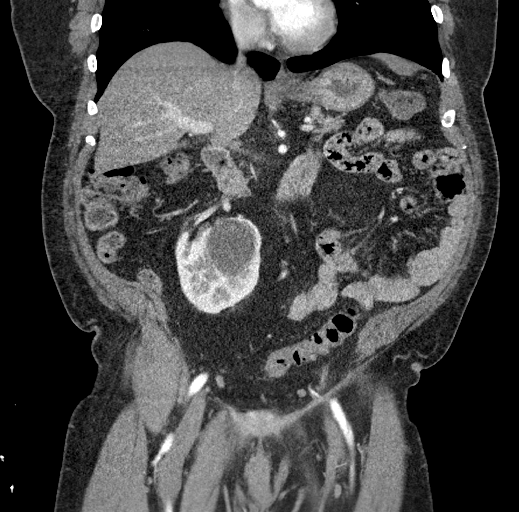
[im 99/178  soft-tissue]
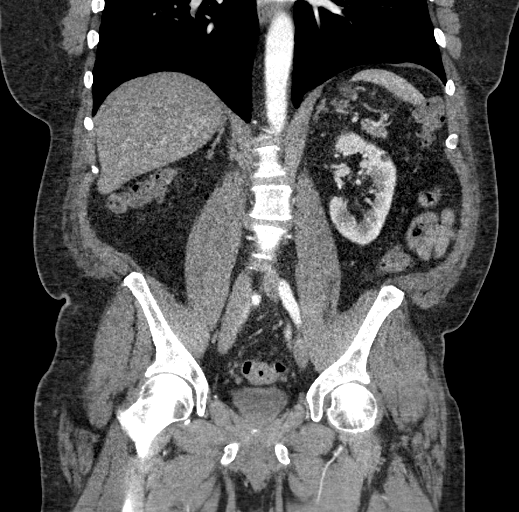

[17 of 46 positions shown; findings below may reference images not displayed]

FINDINGS: Lower chest: No acute abnormality.

Hepatobiliary: No solid liver abnormality is seen. Hepatic steatosis
no gallstones, gallbladder wall thickening, or biliary dilatation.

Pancreas: Unremarkable. No pancreatic ductal dilatation or
surrounding inflammatory changes.

Spleen: Normal in size without significant abnormality.

Adrenals/Urinary Tract: Adrenal glands are unremarkable. Under
rotated right kidney. Simple cyst of the inferior pole right kidney.
No renal calculi or hydronephrosis. Bladder is unremarkable.

Stomach/Bowel: Stomach is within normal limits. Appendix appears
normal. No evidence of bowel wall thickening, distention, or
inflammatory changes. Descending and sigmoid diverticulosis without
evidence of acute diverticulitis.

Vascular/Lymphatic: Iliac atherosclerosis. There is a
redemonstrated, chronic dissection of the left external iliac artery
(series 3, image 62). No enlarged abdominal or pelvic lymph nodes.

Reproductive: No mass or other significant abnormality.

Other: No abdominal wall hernia or abnormality. No abdominopelvic
ascites.

Musculoskeletal: No acute or significant osseous findings.
IMPRESSION: 1. No acute CT findings of the abdomen or pelvis to explain
abdominal pain.

2. Descending and sigmoid diverticulosis without evidence of acute
diverticulitis.

3. There is a redemonstrated, chronic dissection of the left
external iliac artery (series 3, image 62).

4.  Hepatic steatosis.

## 2021-08-05 ENCOUNTER — Other Ambulatory Visit: Payer: Self-pay

## 2021-08-05 DIAGNOSIS — J45909 Unspecified asthma, uncomplicated: Secondary | ICD-10-CM

## 2021-08-06 MED ORDER — ALBUTEROL SULFATE HFA 108 (90 BASE) MCG/ACT IN AERS: 2.0000 | INHALATION_SPRAY | Freq: Four times a day (QID) | RESPIRATORY_TRACT | 3 refills | Status: DC | PRN

## 2021-08-31 ENCOUNTER — Other Ambulatory Visit: Payer: Self-pay | Admitting: Internal Medicine

## 2021-08-31 DIAGNOSIS — M109 Gout, unspecified: Secondary | ICD-10-CM

## 2021-10-06 ENCOUNTER — Other Ambulatory Visit: Payer: Self-pay | Admitting: Internal Medicine

## 2021-10-06 DIAGNOSIS — R0981 Nasal congestion: Secondary | ICD-10-CM

## 2021-10-06 NOTE — Telephone Encounter (Signed)
Next appt scheduled 10/16/21 with Dr Ileene Musa.

## 2021-10-16 ENCOUNTER — Ambulatory Visit (INDEPENDENT_AMBULATORY_CARE_PROVIDER_SITE_OTHER): Payer: Medicare Other | Admitting: Internal Medicine

## 2021-10-16 ENCOUNTER — Other Ambulatory Visit: Payer: Self-pay

## 2021-10-16 ENCOUNTER — Encounter: Payer: Self-pay | Admitting: Internal Medicine

## 2021-10-16 VITALS — BP 127/94 | HR 85 | Temp 98.2°F | Resp 24 | Ht 65.0 in | Wt 177.0 lb

## 2021-10-16 DIAGNOSIS — K5900 Constipation, unspecified: Secondary | ICD-10-CM | POA: Diagnosis not present

## 2021-10-16 DIAGNOSIS — C61 Malignant neoplasm of prostate: Secondary | ICD-10-CM

## 2021-10-16 DIAGNOSIS — R0981 Nasal congestion: Secondary | ICD-10-CM

## 2021-10-16 DIAGNOSIS — J45909 Unspecified asthma, uncomplicated: Secondary | ICD-10-CM

## 2021-10-16 DIAGNOSIS — K5904 Chronic idiopathic constipation: Secondary | ICD-10-CM

## 2021-10-16 DIAGNOSIS — I10 Essential (primary) hypertension: Secondary | ICD-10-CM | POA: Diagnosis not present

## 2021-10-16 DIAGNOSIS — E119 Type 2 diabetes mellitus without complications: Secondary | ICD-10-CM

## 2021-10-16 DIAGNOSIS — M109 Gout, unspecified: Secondary | ICD-10-CM

## 2021-10-16 DIAGNOSIS — R102 Pelvic and perineal pain: Secondary | ICD-10-CM

## 2021-10-16 LAB — POCT URINALYSIS DIPSTICK
Bilirubin, UA: NEGATIVE
Blood, UA: NEGATIVE
Glucose, UA: NEGATIVE
Ketones, UA: NEGATIVE
Leukocytes, UA: NEGATIVE
Nitrite, UA: NEGATIVE
Protein, UA: NEGATIVE
Spec Grav, UA: >=1.03 — AB (ref 1.010–1.025)
Urobilinogen, UA: 0.2 E.U./dL
pH, UA: 5 (ref 5.0–8.0)

## 2021-10-16 LAB — POCT GLYCOSYLATED HEMOGLOBIN (HGB A1C): Hemoglobin A1C: 6 % — AB (ref 4.0–5.6)

## 2021-10-16 LAB — GLUCOSE, CAPILLARY: Glucose-Capillary: 97 mg/dL (ref 70–99)

## 2021-10-16 MED ORDER — ALBUTEROL SULFATE HFA 108 (90 BASE) MCG/ACT IN AERS: 2.0000 | INHALATION_SPRAY | Freq: Four times a day (QID) | RESPIRATORY_TRACT | 3 refills | Status: DC | PRN

## 2021-10-16 MED ORDER — ATORVASTATIN CALCIUM 40 MG PO TABS: 40.0000 mg | ORAL_TABLET | Freq: Every day | ORAL | 3 refills | Status: DC

## 2021-10-16 MED ORDER — METFORMIN HCL 1000 MG PO TABS: 1000.0000 mg | ORAL_TABLET | Freq: Two times a day (BID) | ORAL | 1 refills | Status: DC

## 2021-10-16 MED ORDER — ALLOPURINOL 100 MG PO TABS: 300.0000 mg | ORAL_TABLET | Freq: Every day | ORAL | 2 refills | Status: DC

## 2021-10-16 MED ORDER — HYDROCHLOROTHIAZIDE 25 MG PO TABS: 25.0000 mg | ORAL_TABLET | Freq: Every day | ORAL | 3 refills | Status: DC

## 2021-10-16 MED ORDER — POLYETHYLENE GLYCOL 3350 17 GM/SCOOP PO POWD: ORAL | 1 refills | Status: DC

## 2021-10-16 MED ORDER — FLUTICASONE PROPIONATE 50 MCG/ACT NA SUSP: 1.0000 | Freq: Every day | NASAL | 2 refills | Status: DC | PRN

## 2021-10-16 MED ORDER — OLMESARTAN MEDOXOMIL 20 MG PO TABS: 20.0000 mg | ORAL_TABLET | Freq: Every day | ORAL | 1 refills | Status: DC

## 2021-10-16 MED ORDER — LACTULOSE 10 GM/15ML PO SOLN: 10.0000 g | Freq: Two times a day (BID) | ORAL | 0 refills | Status: DC | PRN

## 2021-10-16 NOTE — Assessment & Plan Note (Addendum)
Patient mentioned that he is still having ongoing abdominal pain and constipation. He says he is intermittently taking miralax but he doesn't like using it since he doesn't like the powder. Patient also complaining of suprapubic pain today however UA looks unremarkable. Do not suspect he has a UTI at this time, this pain is likely all related to the constipation.  Plan: - continue fiber supplements - discontinue miralax, start lactulose.

## 2021-10-16 NOTE — Assessment & Plan Note (Signed)
Well controlled today, A1c 6.0.  - continue metformin 1000 BID

## 2021-10-16 NOTE — Assessment & Plan Note (Signed)
Allopurinol refilled today

## 2021-10-16 NOTE — Assessment & Plan Note (Signed)
Patient mentioned that his prostate has not been checked in over ten years today, however per chart review he had normal PSA 9 months ago. - repeat PSA in about 1 year

## 2021-10-16 NOTE — Assessment & Plan Note (Signed)
Well controlled today on olmesartan  HCTZ. BP 127/94. - continue current management - refills sent

## 2021-10-16 NOTE — Patient Instructions (Addendum)
Austin Anderson  It was a pleasure seeing you in the clinic today.   We talked about your abdominal pain, diabetes, your prostate, and constipation.  Diabetes- your diabetes is well controlled. Continue to take your metformin. 2. Blood pressure- your blood pressure is well controlled. Continue to take your HCTZ and olmesartan. 3. Constipation- Start taking lactulose 1-2 times a day until you have soft bowel movements and your constipation improves  Please call our clinic at [phone] if you have any questions or concerns. The best time to call is Monday-Friday from 9am-4pm, but there is someone available 24/7 at the same number. If you need medication refills, please notify your pharmacy one week in advance and they will send [place] a request.   Thank you for letting us take part in your care. We look forward to seeing you next time!

## 2021-10-16 NOTE — Progress Notes (Signed)
° °  CC: Abdominal pain  HPI:  [name] is a 71 y.o. PMH noted below, who presents to the [facility] with complaints of abdominal pain. To see the management of his acute and chronic conditions, please refer to the A&P note under the encounters tab.   Past Medical History:  Diagnosis Date   Asthma    Reported by patient's last physician before coming to [facility].  No PFT's on file.    Diabetes mellitus, type 2 ([place])    Diverticulosis    Gout    Hypertension    Prostate cancer [facility])    Prostatic adenocarcinoma, Gleason score 7 (3+4), s/p radical prostatectomy 12/04/10 by Dr. [name].    Review of Systems:  Positive for constipation, suprapubic pain, negative for dysuria, fever, chills  Physical Exam: Gen: Elderly man in NAD HEENT: normocephalic atraumatic, MMM CV: RRR, no m/r/g   Resp: CTAB, normal WOB  GI: soft, suprapubic tenderness present MSK: moves all extremities without difficulty Skin:warm and dry Neuro:alert answering questions appropriately Psych: normal affect   Assessment & Plan:   See Encounters Tab for problem based charting.  Patient discussed with Dr. Daryll Drown

## 2021-10-17 ENCOUNTER — Other Ambulatory Visit: Payer: Self-pay

## 2021-10-17 DIAGNOSIS — K5900 Constipation, unspecified: Secondary | ICD-10-CM

## 2021-10-17 LAB — URINALYSIS, ROUTINE W REFLEX MICROSCOPIC
Bilirubin, UA: NEGATIVE
Glucose, UA: NEGATIVE
Ketones, UA: NEGATIVE
Leukocytes,UA: NEGATIVE
Nitrite, UA: NEGATIVE
Protein,UA: NEGATIVE
RBC, UA: NEGATIVE
Specific Gravity, UA: 1.022 (ref 1.005–1.030)
Urobilinogen, Ur: 0.2 mg/dL (ref 0.2–1.0)
pH, UA: 5 (ref 5.0–7.5)

## 2021-10-17 MED ORDER — LACTULOSE 10 GM/15ML PO SOLN: 10.0000 g | Freq: Two times a day (BID) | ORAL | 2 refills | Status: DC | PRN

## 2021-10-20 ENCOUNTER — Other Ambulatory Visit: Payer: Self-pay

## 2021-10-20 DIAGNOSIS — K5900 Constipation, unspecified: Secondary | ICD-10-CM

## 2021-10-20 DIAGNOSIS — E119 Type 2 diabetes mellitus without complications: Secondary | ICD-10-CM

## 2021-10-20 DIAGNOSIS — J45909 Unspecified asthma, uncomplicated: Secondary | ICD-10-CM

## 2021-10-23 MED ORDER — LACTULOSE 10 GM/15ML PO SOLN: 10.0000 g | Freq: Two times a day (BID) | ORAL | 2 refills | Status: DC | PRN

## 2021-10-23 MED ORDER — ALBUTEROL SULFATE HFA 108 (90 BASE) MCG/ACT IN AERS: 2.0000 | INHALATION_SPRAY | Freq: Four times a day (QID) | RESPIRATORY_TRACT | 3 refills | Status: DC | PRN

## 2021-10-23 MED ORDER — METFORMIN HCL 1000 MG PO TABS: 1000.0000 mg | ORAL_TABLET | Freq: Two times a day (BID) | ORAL | 1 refills | Status: DC

## 2021-10-23 NOTE — Progress Notes (Signed)
Internal Medicine Clinic Attending  Case discussed with Dr. Ileene Musa  at the time of the visit.  We reviewed the residents history and exam and pertinent patient test results.  I agree with the assessment, diagnosis, and plan of care documented in the residents note.

## 2021-10-24 MED ORDER — ALBUTEROL SULFATE HFA 108 (90 BASE) MCG/ACT IN AERS: 1.0000 | INHALATION_SPRAY | Freq: Four times a day (QID) | RESPIRATORY_TRACT | 2 refills | Status: DC | PRN

## 2021-10-24 MED ORDER — ALBUTEROL SULFATE HFA 108 (90 BASE) MCG/ACT IN AERS: 2.0000 | INHALATION_SPRAY | Freq: Four times a day (QID) | RESPIRATORY_TRACT | 2 refills | Status: DC | PRN

## 2021-10-24 NOTE — Addendum Note (Signed)
Addended by: Inda Coke on: 10/24/2021 03:10 PM   Modules accepted: Orders

## 2021-10-24 NOTE — Addendum Note (Signed)
Addended by: Inda Coke on: 10/24/2021 02:43 PM   Modules accepted: Orders

## 2021-11-03 ENCOUNTER — Telehealth: Payer: Self-pay | Admitting: *Deleted

## 2021-11-03 NOTE — Telephone Encounter (Signed)
Call from patient states he is having abdominal pain x 7 days from his diverticulitis.  Stated he mentioned it at his last visit but was not given anything for.  Wants to know if he can get something for the pain.  Also patient will need to use transportation to get to the clinics which can sometimes take 3 days notice.  No diarrhea or fevers .

## 2021-11-03 NOTE — Telephone Encounter (Signed)
RTC to patient.  Scheduled for an appointment for Wednesday morning.  Unable to come in today for the appointment.  Told to take Tylenol for the pain and no strong pain meds and to get some Miralax to have a bowel movement to see if this will help.  Given instructions about the transportation for Wednesday.  Voiced understanding of.

## 2021-11-03 NOTE — Telephone Encounter (Signed)
RTC to patient about abdominal pain and give him an appointment for follow up.  No answer and unable to leave a message.  Will call patient again and attempt to give instructions and a follow up appointment.

## 2021-11-05 ENCOUNTER — Encounter: Payer: Self-pay | Admitting: Student

## 2021-11-05 ENCOUNTER — Ambulatory Visit (INDEPENDENT_AMBULATORY_CARE_PROVIDER_SITE_OTHER): Payer: Medicare Other | Admitting: Student

## 2021-11-05 VITALS — BP 118/73 | HR 97 | Temp 98.4°F | Wt 174.0 lb

## 2021-11-05 DIAGNOSIS — K5790 Diverticulosis of intestine, part unspecified, without perforation or abscess without bleeding: Secondary | ICD-10-CM

## 2021-11-05 DIAGNOSIS — K5732 Diverticulitis of large intestine without perforation or abscess without bleeding: Secondary | ICD-10-CM | POA: Diagnosis not present

## 2021-11-05 MED ORDER — AMOXICILLIN-POT CLAVULANATE 875-125 MG PO TABS: 1.0000 | ORAL_TABLET | Freq: Two times a day (BID) | ORAL | 0 refills | Status: DC

## 2021-11-05 NOTE — Assessment & Plan Note (Signed)
Patient with longstanding history of sigmoid diverticulosis complicated by recurrent diverticulitis (seen about 7 times in clinic for diverticulosis/diverticulitis in the past 2 years). Today, he states that he has been experiencing abdominal pain for the past 2-3 days. Abdominal pain is located below his navel and feels like someone is squeezing his stomach, both of which are similar to prior episodes. States that this current episode is not the worst he has experienced. Denies any nausea, vomiting, fevers, chills, chest pain, SHOB, changes in urination. Does endorse chronic constipation for which he uses miralax and milk of magnesia daily. He has been having regular BMs with use of laxatives (about once a day).  Discussed that patient's symptoms will likely improve without need for antibiotics. However, he reports that he has found relief with antibiotics in the past and would like a course for treatment. Will provided 7-day course of Augmentin per patient preference. Discussed avoiding opiate pain medications to prevent worsening of constipation, but recommended tylenol prn to help with pain relief. Recommended continued use of laxative therapy and increasing fiber intake to help with constipation.  Given diverticulosis with recurrent episodes of diverticulitis, will refer to general surgery for evaluation. He states that he was seen by general surgery in the past (1 or 2 years ago) and was told that he would not be a candidate for surgical intervention due to symptoms being mild. Discussed that given recurrent episodes since that visit, it would be beneficial to obtain reevaluation. He is agreeable with this.  Plan: -continue laxative therapy, fiber supplementation -Augmentin 875-125mg  BID for 7 days -referral to general surgery

## 2021-11-05 NOTE — Progress Notes (Signed)
° °  CC: abdominal pain  HPI:  [name] is a 71 y.o. male with history listed below presenting to the [facility] for abdominal pain. Please see individualized problem based charting for full HPI.  Past Medical History:  Diagnosis Date   Asthma    Reported by patient's last physician before coming to [facility].  No PFT's on file.    Diabetes mellitus, type 2 ([place])    Diverticulosis    Gout    Hypertension    Prostate cancer [facility])    Prostatic adenocarcinoma, Gleason score 7 (3+4), s/p radical prostatectomy 12/04/10 by Dr. [name].     Review of Systems:  Negative aside from that listed in individualized problem based charting.  Physical Exam:  Vitals:   11/05/21 1043  BP: 118/73  Pulse: 97  Temp: 98.4 F (36.9 C)  TempSrc: Oral  SpO2: 99%  Weight: 174 lb (78.9 kg)   Physical Exam Constitutional:      Appearance: He is well-developed. He is not ill-appearing.  Eyes:     Comments: Legally blind bilaterally  Cardiovascular:     Rate and Rhythm: Normal rate and regular rhythm.     Heart sounds: Normal heart sounds. No murmur heard.   No gallop.  Pulmonary:     Effort: Pulmonary effort is normal.     Breath sounds: Normal breath sounds. No wheezing, rhonchi or rales.  Abdominal:     General: Abdomen is protuberant. Bowel sounds are decreased.     Palpations: Abdomen is soft.     Tenderness: There is abdominal tenderness in the right lower quadrant, suprapubic area and left lower quadrant. There is no guarding or rebound.     Hernia: No hernia is present.  Skin:    General: Skin is warm and dry.  Neurological:     General: No focal deficit present.     Mental Status: He is alert and oriented to person, place, and time.  Psychiatric:        Mood and Affect: Mood normal.        Behavior: Behavior normal.     Assessment & Plan:   See Encounters Tab for problem based charting.  Patient discussed with Dr. Dareen Piano

## 2021-11-05 NOTE — Patient Instructions (Signed)
Mr. Seelbach,  It was a pleasure seeing you in the clinic today.   I have prescribed a 7 day course of Augmentin. Please take it twice a day. I have placed a referral to general surgery for management of your diverticulosis.  Please call our clinic at [phone] if you have any questions or concerns. The best time to call is Monday-Friday from 9am-4pm, but there is someone available 24/7 at the same number. If you need medication refills, please notify your pharmacy one week in advance and they will send [place] a request.   Thank you for letting us take part in your care. We look forward to seeing you next time!

## 2021-11-14 NOTE — Progress Notes (Signed)
Internal Medicine Clinic Attending  Case discussed with Dr. Jinwala  At the time of the visit.  We reviewed the resident's history and exam and pertinent patient test results.  I agree with the assessment, diagnosis, and plan of care documented in the resident's note.  

## 2022-01-11 ENCOUNTER — Other Ambulatory Visit: Payer: Self-pay | Admitting: Internal Medicine

## 2022-01-11 DIAGNOSIS — E119 Type 2 diabetes mellitus without complications: Secondary | ICD-10-CM

## 2022-01-30 ENCOUNTER — Other Ambulatory Visit: Payer: Self-pay | Admitting: Internal Medicine

## 2022-01-30 DIAGNOSIS — I10 Essential (primary) hypertension: Secondary | ICD-10-CM

## 2022-02-02 ENCOUNTER — Encounter (INDEPENDENT_AMBULATORY_CARE_PROVIDER_SITE_OTHER): Payer: Self-pay

## 2022-02-02 ENCOUNTER — Encounter: Payer: Self-pay | Admitting: Internal Medicine

## 2022-02-02 ENCOUNTER — Ambulatory Visit (INDEPENDENT_AMBULATORY_CARE_PROVIDER_SITE_OTHER): Payer: Medicare Other | Admitting: Internal Medicine

## 2022-02-02 VITALS — BP 143/85 | HR 99 | Temp 98.2°F | Ht 65.0 in | Wt 175.9 lb

## 2022-02-02 DIAGNOSIS — R102 Pelvic and perineal pain: Secondary | ICD-10-CM

## 2022-02-02 DIAGNOSIS — Z87891 Personal history of nicotine dependence: Secondary | ICD-10-CM

## 2022-02-02 DIAGNOSIS — K5790 Diverticulosis of intestine, part unspecified, without perforation or abscess without bleeding: Secondary | ICD-10-CM | POA: Diagnosis not present

## 2022-02-02 DIAGNOSIS — E119 Type 2 diabetes mellitus without complications: Secondary | ICD-10-CM

## 2022-02-02 DIAGNOSIS — I10 Essential (primary) hypertension: Secondary | ICD-10-CM

## 2022-02-02 DIAGNOSIS — C61 Malignant neoplasm of prostate: Secondary | ICD-10-CM

## 2022-02-02 DIAGNOSIS — R109 Unspecified abdominal pain: Secondary | ICD-10-CM | POA: Insufficient documentation

## 2022-02-02 LAB — POCT URINALYSIS DIPSTICK
Bilirubin, UA: NEGATIVE
Blood, UA: NEGATIVE
Glucose, UA: NEGATIVE
Ketones, UA: NEGATIVE
Leukocytes, UA: NEGATIVE
Nitrite, UA: NEGATIVE
Protein, UA: POSITIVE — AB
Spec Grav, UA: >=1.03 — AB (ref 1.010–1.025)
Urobilinogen, UA: 0.2 E.U./dL
pH, UA: 5.5 (ref 5.0–8.0)

## 2022-02-02 LAB — POCT GLYCOSYLATED HEMOGLOBIN (HGB A1C): Hemoglobin A1C: 6.2 % — AB (ref 4.0–5.6)

## 2022-02-02 LAB — GLUCOSE, CAPILLARY: Glucose-Capillary: 79 mg/dL (ref 70–99)

## 2022-02-02 NOTE — Assessment & Plan Note (Signed)
PSA check today -f/u PSA

## 2022-02-02 NOTE — Assessment & Plan Note (Signed)
>>  ASSESSMENT AND PLAN FOR FUNCTIONAL ABDOMINAL PAIN SYNDROME WRITTEN ON 02/05/2022 10:27 AM BY Ilene Qua, MD  Patient has suprapubic pain that occurs very frequently and is worse after eating. He thinks this pain is related to her diverticulosis. However the pain happens almost every day and it is worse after eating. It sometimes gets better when he has a bowel movement. He says that the pain gets better with antibiotics. He feels like he has had blood in his urine once recently because his friend told him his urine looked dark. However he is blind so he cannot see it. He denies constipation and says he takes regular stool softeners and has 2-3 bowel movements a day. No nausea or vomiting. Has not seen a urologist in a long time nor a GI doctor. On chart review he does have a significant unintentional weight loss of about 40 lbs.  Given his pain out of proportion to exam I am concerned that there could be a component of mesenteric ischemia. Will order a CTA. If the CTA shows nothing he may benefit from a GI referral. - BMP, CBC, PSA - CTA - consider GI referral

## 2022-02-02 NOTE — Assessment & Plan Note (Signed)
Patient has suprapubic pain that occurs very frequently and is worse after eating. He thinks this pain is related to her diverticulosis. However the pain happens almost every day and it is worse after eating. It sometimes gets better when he has a bowel movement. He says that the pain gets better with antibiotics. He feels like he has had blood in his urine once recently because his friend told him his urine looked dark. However he is blind so he cannot see it. He denies constipation and says he takes regular stool softeners and has 2-3 bowel movements a day. No nausea or vomiting. Has not seen a urologist in a long time nor a GI doctor.  Given his pain out of proportion to exam I am concerned that there could be a component of mesenteric ischemia. Will order a CTA. If the CTA shows nothing he may benefit from a GI referral. - BMP, CBC, PSA - CTA - consider GI referral

## 2022-02-02 NOTE — Assessment & Plan Note (Signed)
A1c at goal continue metformin 

## 2022-02-02 NOTE — Patient Instructions (Signed)
Austin Anderson  It was a pleasure seeing you in the clinic today.   We talked about your abdominal pain. I am going to get some lab tests today and also order an imaging study. Depending on the results I may refer you to the stomach doctors.   Please call our clinic at [phone] if you have any questions or concerns. The best time to call is Monday-Friday from 9am-4pm, but there is someone available 24/7 at the same number. If you need medication refills, please notify your pharmacy one week in advance and they will send [place] a request.   Thank you for letting us take part in your care. We look forward to seeing you next time!

## 2022-02-02 NOTE — Progress Notes (Unsigned)
   CC: abdominal pain  HPI:  [name] is a 71 y.o. PMH noted below, who presents to the [name] with complaints of abdominal pain. To see the management of his acute and chronic conditions, please refer to the A&P note under the encounters tab.   Past Medical History:  Diagnosis Date   Asthma    Reported by patient's last physician before coming to [name].  No PFT's on file.    Diabetes mellitus, type 2 ([name])    Diverticulosis    Gout    Hypertension    Prostate cancer [name])    Prostatic adenocarcinoma, Gleason score 7 (3+4), s/p radical prostatectomy 12/04/10 by Dr. Alinda Anderson.    Review of Systems:  positive for suprapubic pain, hematuria, post-prandial pain, negative for constipation, nausea, vomiting  Physical Exam: Gen: elderly man in NAD HEENT: normocephalic atraumatic, visually impaired CV: RRR, no m/r/g   Resp: CTAB, normal WOB  GI: soft, mild tenderness to palpation in suprapubic region MSK: moves all extremities without difficulty Skin:warm and dry Neuro:alert answering questions appropriately Psych: normal affect   Assessment & Plan:   See Encounters Tab for problem based charting.  Patient discussed with Dr. Philipp Anderson

## 2022-02-03 LAB — LIPID PANEL
Chol/HDL Ratio: 2.6 ratio (ref 0.0–5.0)
Cholesterol, Total: 157 mg/dL (ref 100–199)
HDL: 61 mg/dL (ref >39)
LDL Chol Calc (NIH): 86 mg/dL (ref 0–99)
Triglycerides: 49 mg/dL (ref 0–149)
VLDL Cholesterol Cal: 10 mg/dL (ref 5–40)

## 2022-02-03 LAB — CBC
Hematocrit: 39 % (ref 37.5–51.0)
Hemoglobin: 12.6 g/dL — ABNORMAL LOW (ref 13.0–17.7)
MCH: 27.6 pg (ref 26.6–33.0)
MCHC: 32.3 g/dL (ref 31.5–35.7)
MCV: 86 fL (ref 79–97)
Platelets: 283 10*3/uL (ref 150–450)
RBC: 4.56 x10E6/uL (ref 4.14–5.80)
RDW: 14.6 % (ref 11.6–15.4)
WBC: 5.4 10*3/uL (ref 3.4–10.8)

## 2022-02-03 LAB — BMP8+ANION GAP
Anion Gap: 14 mmol/L (ref 10.0–18.0)
BUN/Creatinine Ratio: 11 (ref 10–24)
BUN: 15 mg/dL (ref 8–27)
CO2: 25 mmol/L (ref 20–29)
Calcium: 10 mg/dL (ref 8.6–10.2)
Chloride: 100 mmol/L (ref 96–106)
Creatinine, Ser: 1.34 mg/dL — ABNORMAL HIGH (ref 0.76–1.27)
Glucose: 92 mg/dL (ref 70–99)
Potassium: 4.3 mmol/L (ref 3.5–5.2)
Sodium: 139 mmol/L (ref 134–144)
eGFR: 58 mL/min/{1.73_m2} — ABNORMAL LOW (ref >59)

## 2022-02-03 LAB — PSA: Prostate Specific Ag, Serum: <0.1 ng/mL (ref 0.0–4.0)

## 2022-02-05 NOTE — Progress Notes (Signed)
Internal Medicine Clinic Attending ° °Case discussed with Dr. DeMaio  At the time of the visit.  We reviewed the resident’s history and exam and pertinent patient test results.  I agree with the assessment, diagnosis, and plan of care documented in the resident’s note. ° ° °

## 2022-02-15 ENCOUNTER — Other Ambulatory Visit: Payer: Self-pay | Admitting: Internal Medicine

## 2022-02-15 DIAGNOSIS — E119 Type 2 diabetes mellitus without complications: Secondary | ICD-10-CM

## 2022-02-26 ENCOUNTER — Ambulatory Visit (HOSPITAL_COMMUNITY): Payer: Medicare Other

## 2022-03-02 ENCOUNTER — Encounter: Payer: Medicare Other | Admitting: Student

## 2022-03-02 ENCOUNTER — Other Ambulatory Visit: Payer: Self-pay | Admitting: Internal Medicine

## 2022-03-02 DIAGNOSIS — I10 Essential (primary) hypertension: Secondary | ICD-10-CM

## 2022-03-03 ENCOUNTER — Encounter: Payer: Self-pay | Admitting: Internal Medicine

## 2022-03-04 ENCOUNTER — Ambulatory Visit (HOSPITAL_COMMUNITY)
Admission: RE | Admit: 2022-03-04 | Discharge: 2022-03-04 | Disposition: A | Discharge status: Home / Self Care | Payer: Medicare Other | Source: Ambulatory Visit | Attending: Internal Medicine | Admitting: Internal Medicine

## 2022-03-04 DIAGNOSIS — K5790 Diverticulosis of intestine, part unspecified, without perforation or abscess without bleeding: Secondary | ICD-10-CM | POA: Diagnosis not present

## 2022-03-04 DIAGNOSIS — R102 Pelvic and perineal pain: Secondary | ICD-10-CM | POA: Diagnosis present

## 2022-03-04 DIAGNOSIS — I7772 Dissection of iliac artery: Secondary | ICD-10-CM | POA: Diagnosis not present

## 2022-03-04 DIAGNOSIS — N281 Cyst of kidney, acquired: Secondary | ICD-10-CM | POA: Diagnosis not present

## 2022-03-04 DIAGNOSIS — K551 Chronic vascular disorders of intestine: Secondary | ICD-10-CM | POA: Diagnosis not present

## 2022-03-04 DIAGNOSIS — I723 Aneurysm of iliac artery: Secondary | ICD-10-CM | POA: Diagnosis not present

## 2022-03-04 MED ORDER — IOHEXOL 350 MG/ML SOLN
100.0000 mL | Freq: Once | INTRAVENOUS | Status: AC | PRN
  Administered 2022-03-04: 100 mL via INTRAVENOUS

## 2022-03-16 ENCOUNTER — Ambulatory Visit (INDEPENDENT_AMBULATORY_CARE_PROVIDER_SITE_OTHER): Payer: Medicare Other | Admitting: Internal Medicine

## 2022-03-16 ENCOUNTER — Encounter: Payer: Self-pay | Admitting: Internal Medicine

## 2022-03-16 VITALS — BP 132/85 | HR 72 | Temp 98.2°F | Wt 171.4 lb

## 2022-03-16 DIAGNOSIS — I10 Essential (primary) hypertension: Secondary | ICD-10-CM | POA: Diagnosis not present

## 2022-03-16 DIAGNOSIS — R1030 Lower abdominal pain, unspecified: Secondary | ICD-10-CM

## 2022-03-16 DIAGNOSIS — R102 Pelvic and perineal pain: Secondary | ICD-10-CM

## 2022-03-16 DIAGNOSIS — Z87891 Personal history of nicotine dependence: Secondary | ICD-10-CM

## 2022-03-16 DIAGNOSIS — I7772 Dissection of iliac artery: Secondary | ICD-10-CM | POA: Diagnosis not present

## 2022-03-16 MED ORDER — OLMESARTAN MEDOXOMIL 20 MG PO TABS: 20.0000 mg | ORAL_TABLET | Freq: Every day | ORAL | 2 refills | Status: DC

## 2022-03-16 MED ORDER — HYDROCHLOROTHIAZIDE 25 MG PO TABS: 25.0000 mg | ORAL_TABLET | Freq: Every day | ORAL | 3 refills | Status: DC

## 2022-03-16 NOTE — Assessment & Plan Note (Signed)
>>  ASSESSMENT AND PLAN FOR FUNCTIONAL ABDOMINAL PAIN SYNDROME WRITTEN ON 03/16/2022  6:18 PM BY [name], [name], DO  Patient presents with pain below navel and midline.  He states that abdomen feels tender constantly and he has frequent severe abdominal pain episodes.  He is changed his eating habits because of pain and now eats about 1 meal a day to try to prevent pain.  He has daily bowel movements and does take MiraLAX if he feels like he is constipated.  He has an aide who helps with IADLs and they have noted blood in stool in the past.  He denies hematochezia, weight loss, and dysuria/difficulty voiding.  He was recently evaluated with a CTA due to concerns for mesenteric ischemia and imaging showed patent arteries.  Last colonoscopy was in 2016 and showed diverticula. A/P: Symptoms do not appear to be consistent with diverticulosis or diverticulitis.  He is having continuous abdominal tenderness.  With continued symptoms I would like to have him further evaluated by GI for possible repeat of EGD or colonoscopy.  It is reassuring that despite his dietary changes his weight has remained stable around 170 pounds.

## 2022-03-16 NOTE — Patient Instructions (Signed)
Thank you, [name] for allowing [place] to provide your care today. Today we discussed abdominal pain. I would like you to go back to GI to repeat colonoscopy and endoscopy due to your continued abdominal pain.    I have ordered the following labs for you:  Lab Orders  No laboratory test(s) ordered today     Referrals ordered today:   Referral Orders  No referral(s) requested today     I have ordered the following medication/changed the following medications:   Stop the following medications: Medications Discontinued During This Encounter  Medication Reason   amoxicillin-clavulanate (AUGMENTIN) 875-125 MG tablet Completed Course     Start the following medications: No orders of the defined types were placed in this encounter.    Follow up: 2 months to recheck diabetes blood work  We look forward to seeing you next time. Please call our clinic at [phone] if you have any questions or concerns. The best time to call is Monday-Friday from 9am-4pm, but there is someone available 24/7. If after hours or the weekend, call the main hospital number and ask for the Internal Medicine Resident On-Call. If you need medication refills, please notify your pharmacy one week in advance and they will send [place] a request.   Thank you for trusting me with your care. Wishing you the best!   Austin Anderson, [place]

## 2022-03-16 NOTE — Assessment & Plan Note (Addendum)
Patient presents with pain below navel and midline.  He states that abdomen feels tender constantly and he has frequent severe abdominal pain episodes.  He is changed his eating habits because of pain and now eats about 1 meal a day to try to prevent pain.  He has daily bowel movements and does take MiraLAX if he feels like he is constipated.  He has an aide who helps with IADLs and they have noted blood in stool in the past.  He denies hematochezia, weight loss, and dysuria/difficulty voiding.  He was recently evaluated with a CTA due to concerns for mesenteric ischemia and imaging showed patent arteries.  Last colonoscopy was in 2016 and showed diverticula. A/P: Symptoms do not appear to be consistent with diverticulosis or diverticulitis.  He is having continuous abdominal tenderness.  With continued symptoms I would like to have him further evaluated by GI for possible repeat of EGD or colonoscopy.  It is reassuring that despite his dietary changes his weight has remained stable around 170 pounds.

## 2022-03-16 NOTE — Progress Notes (Addendum)
Subjective:  CC: abdominal pain  HPI:  [name] is a 71 y.o. male with a past medical history stated below and presents today for abdominal pain. Please see problem based assessment and plan for additional details.  Past Medical History:  Diagnosis Date   Asthma    Reported by patient's last physician before coming to [facility].  No PFT's on file.    Diabetes mellitus, type 2 ([place])    Diverticulosis    Gout    Hypertension    Prostate cancer [facility])    Prostatic adenocarcinoma, Gleason score 7 (3+4), s/p radical prostatectomy 12/04/10 by Dr. [name].     Current Outpatient Medications on File Prior to Visit  Medication Sig Dispense Refill   albuterol (PROVENTIL HFA) 108 (90 Base) MCG/ACT inhaler Inhale 1-2 puffs into the lungs every 6 (six) hours as needed for wheezing or shortness of breath. 6.7 g 2   allopurinol (ZYLOPRIM) 100 MG tablet Take 3 tablets (300 mg total) by mouth daily. 270 tablet 2   atorvastatin (LIPITOR) 40 MG tablet Take 1 tablet (40 mg total) by mouth daily. 90 tablet 3   fluticasone (FLONASE) 50 MCG/ACT nasal spray Place 1 spray into both nostrils daily as needed for allergies or rhinitis. 32 mL 2   lactulose (CHRONULAC) 10 GM/15ML solution Take 15 mLs (10 g total) by mouth 2 (two) times daily as needed for mild constipation. 473 mL 2   metFORMIN (GLUCOPHAGE) 1000 MG tablet TAKE 1 TABLET BY MOUTH TWICE  DAILY WITH A MEAL 200 tablet 2   No current facility-administered medications on file prior to visit.    Family History  Problem Relation Age of Onset   Heart attack Father 67   Heart disease Mother    Diabetes Sister     Social History   Socioeconomic History   Marital status: Single    Spouse name: Not on file   Number of children: Not on file   Years of education: Not on file   Highest education level: Not on file  Occupational History   Not on file  Tobacco Use   Smoking status: Former    Types: Cigarettes   Smokeless tobacco: Never    Tobacco comments:    30 years ago  Substance and Sexual Activity   Alcohol use: No    Alcohol/week: 0.0 standard drinks of alcohol   Drug use: No   Sexual activity: Not on file  Other Topics Concern   Not on file  Social History Narrative   The patient lives alone in [place].  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  The patient is on medicaid/medicare, and receives a disability check.  The patient has a sister in [place].  The patient has a 10th grade education.  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  The patient lost his vision at age 43, and now can only barely see light, but no colors or shapes.  The patient has an aide who comes to his house every week to help him sort his medications.   Social Determinants of Health   Financial Resource Strain: Not on file  Food Insecurity: Not on file  Transportation Needs: Not on file  Physical Activity: Not on file  Stress: Not on file  Social Connections: Not on file  Intimate Partner Violence: Not on file    Review of Systems:  ROS negative except for what is noted on the assessment and plan.  Objective:   Vitals:   03/16/22 1322  BP: 132/85  Pulse: 72  Temp: 98.2 F (36.8 C)  TempSrc: Oral  SpO2: 100%  Weight: 171 lb 6.4 oz (77.7 kg)    Physical Exam: Constitutional: well-appearing, sitting in wheelchair with walking stick beside him Eyes: Sunglasses in place Cardiovascular: regular rate and rhythm, no m/r/g Pulmonary/Chest: normal work of breathing on room air, lungs clear to auscultation bilaterally Abdominal: soft, non-tender, non-distended, bowel sounds appreciated in all quadrants   Assessment & Plan:  Abdominal pain Patient presents with pain below navel and midline.  He states that abdomen feels tender constantly and he has frequent severe abdominal pain episodes.  He is changed his eating habits because of pain and  now eats about 1 meal a day to try to prevent pain.  He has daily bowel movements and does take MiraLAX if he feels like he is constipated.  He has an aide who helps with IADLs and they have noted blood in stool in the past.  He denies hematochezia, weight loss, and dysuria/difficulty voiding.  He was recently evaluated with a CTA due to concerns for mesenteric ischemia and imaging showed patent arteries.  Last colonoscopy was in 2016 and showed diverticula. A/P: Symptoms do not appear to be consistent with diverticulosis or diverticulitis.  He is having continuous abdominal tenderness.  With continued symptoms I would like to have him further evaluated by GI for possible repeat of EGD or colonoscopy.  It is reassuring that despite his dietary changes his weight has remained stable around 170 pounds.   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 MWallace CullensMasters, D.O. C[facility] Medicine  PGY-2 Pager: [phone] Phone: [phone] 03/23/2022  Time 8:03 AM

## 2022-03-18 NOTE — Progress Notes (Signed)
Internal Medicine Clinic Attending  Case discussed with Dr. Howie Ill  At the time of the visit.  We reviewed the resident's history and exam and pertinent patient test results.  I agree with the assessment, diagnosis, and plan of care documented in the resident's note.    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

## 2022-03-23 NOTE — Assessment & Plan Note (Addendum)
CT showed chronic 46m non-flow limiting dissection of left external iliac artery, stable from CT in 2017

## 2022-04-16 ENCOUNTER — Encounter: Payer: Medicare Other | Admitting: Internal Medicine

## 2022-05-13 ENCOUNTER — Ambulatory Visit (INDEPENDENT_AMBULATORY_CARE_PROVIDER_SITE_OTHER): Payer: Medicare Other

## 2022-05-13 VITALS — BP 139/89 | HR 98 | Temp 97.8°F | Wt 175.7 lb

## 2022-05-13 DIAGNOSIS — K5900 Constipation, unspecified: Secondary | ICD-10-CM | POA: Diagnosis not present

## 2022-05-13 DIAGNOSIS — E119 Type 2 diabetes mellitus without complications: Secondary | ICD-10-CM

## 2022-05-13 DIAGNOSIS — H543 Unqualified visual loss, both eyes: Secondary | ICD-10-CM

## 2022-05-13 LAB — GLUCOSE, CAPILLARY: Glucose-Capillary: 98 mg/dL (ref 70–99)

## 2022-05-13 LAB — POCT GLYCOSYLATED HEMOGLOBIN (HGB A1C): Hemoglobin A1C: 6.5 % — AB (ref 4.0–5.6)

## 2022-05-13 MED ORDER — BISACODYL 10 MG RE SUPP: 10.0000 mg | RECTAL | 0 refills | Status: DC | PRN

## 2022-05-13 MED ORDER — SENNA 8.6 MG PO TABS: 1.0000 | ORAL_TABLET | Freq: Every day | ORAL | 0 refills | Status: DC

## 2022-05-13 NOTE — Progress Notes (Signed)
   CC: constipation  HPI:  [name] is a 71 y.o.-year-old male with past medical history as below presenting for constipation.  Please see encounters tab for problem-based charting.  Past Medical History:  Diagnosis Date   Asthma    Reported by patient's last physician before coming to [facility].  No PFT's on file.    Diabetes mellitus, type 2 ([name])    Diverticulosis    Gout    Hypertension    Prostate cancer [facility])    Prostatic adenocarcinoma, Gleason score 7 (3+4), s/p radical prostatectomy 12/04/10 by Dr. [name].    Review of Systems: As in HPI.  Please see encounters tab for problem is charting.  Physical Exam:  Vitals:   05/13/22 1326  BP: 139/89  Pulse: 98  Temp: 97.8 F (36.6 C)  TempSrc: Oral  SpO2: 98%  Weight: 175 lb 11.2 oz (79.7 kg)   General:Well-appearing, pleasant, In NAD Cardiac: RRR, no murmurs rubs or gallops. Respiratory: Normal work of breathing on room air, CTA B Abdominal: Soft, nontender, nondistended, bowel sounds present   Assessment & Plan:   Constipation Patient with chronic constipation presenting with slight worsening in the past few days.  No change in diet, no infectious symptoms, no other precipitating factors.  No blood in stool.  Mentions he has a strain to have bowel movements.  He is visually impaired and unsure if he has had any dark stools or blood in the stool but mentions that his aide has not seen anything recently (of note she had previously described 1 episode of "pink stools" several months ago but nothing since then).  He had a colonoscopy in 2016 showing diverticulosis but was otherwise unremarkable.  He has no abdominal pain, urinary changes, nausea or vomiting today.  He does not drink much water and reports that his last bowel movement was 3 days ago and he is still passing gas.  He takes MiraLAX inconsistently but has not tried anything else.  Abdominal exam is benign with bowel sounds present. - Scheduled MiraLAX daily and  senna nightly - Dulcolax suppository as needed for severe constipation - Follow-up in 1 week for reevaluation. - Put in social work consult as he is visually impaired and does not have enough assistance at home to sort through meds and perform other IADLs.    Patient seen with Dr.  Saverio Danker

## 2022-05-13 NOTE — Assessment & Plan Note (Signed)
Patient with chronic constipation presenting with slight worsening in the past few days.  No change in diet, no infectious symptoms, no other precipitating factors.  No blood in stool.  Mentions he has a strain to have bowel movements.  He is visually impaired and unsure if he has had any dark stools or blood in the stool but mentions that his aide has not seen anything recently (of note she had previously described 1 episode of "pink stools" several months ago but nothing since then).  He had a colonoscopy in 2016 showing diverticulosis but was otherwise unremarkable.  He has no abdominal pain, urinary changes, nausea or vomiting today.  He does not drink much water and reports that his last bowel movement was 3 days ago and he is still passing gas.  He takes MiraLAX inconsistently but has not tried anything else.  Abdominal exam is benign with bowel sounds present. - Scheduled MiraLAX daily and senna nightly - Dulcolax suppository as needed for severe constipation - Follow-up in 1 week for reevaluation. - Put in social work consult as he is visually impaired and does not have enough assistance at home to sort through meds and perform other IADLs.

## 2022-05-13 NOTE — Patient Instructions (Signed)
[name], it was a pleasure seeing you today! You endorsed feeling well today. Below are some of the things we talked about this visit. We look forward to seeing you in the follow up appointment!  Today we discussed: Constipation: I don't think you have a bowel obstruction currently. Try taking miralax every day and senna at night as well as drinking 6-8 cups of water a day. You can use the dulcolax suppository if needed.  I have ordered the following labs today:   Lab Orders         Glucose, capillary         POC Hbg A1C       Referrals ordered today:   Referral Orders  No referral(s) requested today     I have ordered the following medication/changed the following medications:   Stop the following medications: There are no discontinued medications.   Start the following medications: Meds ordered this encounter  Medications   senna (SENOKOT) 8.6 MG TABS tablet    Sig: Take 1 tablet (8.6 mg total) by mouth at bedtime.    Dispense:  120 tablet    Refill:  0   bisacodyl (DULCOLAX) 10 MG suppository    Sig: Place 1 suppository (10 mg total) rectally as needed for moderate constipation or severe constipation.    Dispense:  12 suppository    Refill:  0     Follow-up:  1 week    Please make sure to arrive 15 minutes prior to your next appointment. If you arrive late, you may be asked to reschedule.   We look forward to seeing you next time. Please call our clinic at [phone] if you have any questions or concerns. The best time to call is Monday-Friday from 9am-4pm, but there is someone available 24/7. If after hours or the weekend, call the main hospital number and ask for the Internal Medicine Resident On-Call. If you need medication refills, please notify your pharmacy one week in advance and they will send [place] a request.  Thank you for letting us take part in your care. Wishing you the best!  Thank you, Linus Galas MD

## 2022-05-14 ENCOUNTER — Telehealth: Payer: Self-pay | Admitting: *Deleted

## 2022-05-14 NOTE — Chronic Care Management (AMB) (Signed)
  Care Coordination   Note   05/14/2022 Name: SHREY BOIKE MRN: [id number] DOB: Apr 15, 1951  KUNTA HILLEARY is a 71 y.o. year old male who sees Serita Butcher, MD for primary care. I reached out to [name] by phone today to offer care coordination services for referral.  Mr. Maye was given information about Care Coordination services today including:   The Care Coordination services include support from the care team which includes your Nurse Coordinator, Clinical Social Worker, or Pharmacist.  The Care Coordination team is here to help remove barriers to the health concerns and goals most important to you. Care Coordination services are voluntary, and the patient may decline or stop services at any time by request to their care team member.   Care Coordination Consent Status: Patient agreed to services and verbal consent obtained.   Follow up plan:  Telephone appointment with care coordination team member scheduled for:  05/19/22  Encounter Outcome:  Pt. Scheduled  [place]  Direct Dial: [phone]

## 2022-05-14 NOTE — Progress Notes (Signed)
Internal Medicine Clinic Attending  I saw and evaluated the patient.  I personally confirmed the key portions of the history and exam documented by Dr. Sridharan and I reviewed pertinent patient test results.  The assessment, diagnosis, and plan were formulated together and I agree with the documentation in the resident's note.  

## 2022-05-19 ENCOUNTER — Ambulatory Visit: Payer: Self-pay | Admitting: *Deleted

## 2022-05-19 NOTE — Patient Outreach (Signed)
  Care Coordination   Initial Visit Note   05/19/2022 Name: Austin Anderson MRN: [id number] DOB: Oct 24, 1950  Austin Anderson is a 71 y.o. year old male who sees Serita Butcher, MD for primary care. I spoke with  [name] by phone today.  What matters to the patients health and wellness today?  Blind and needs help at home with managing of self, RX, etc.      Goals Addressed               This Visit's Progress     I am blind and need help (pt-stated)        Care Coordination Interventions:  Discussed pt's current in-home assistance- per pt [employer] aide helps him 6 days/week Discussed considering a request to increase hours (if not maxed out), conside PACE program Pt indicates his medications are not always "straight"- will refer to Pharmacy to assist Pt with HTN and DM- refer to [facility] for medical management support/assessment Solution-Focused Strategies employed:  Active listening / Reflection utilized  Consideration of in-home help encouraged : options discussed Discussed caregiver resources and support:  Made referral to Pharmacy and RNCM         SDOH assessments and interventions completed:  Yes  SDOH Interventions Today    Flowsheet Row Most Recent Value  SDOH Interventions   Food Insecurity Interventions Intervention Not Indicated  Housing Interventions Intervention Not Indicated  Transportation Interventions Intervention Not Indicated  [Pt uses Medicaid transportation]        Care Coordination Interventions Activated:  Yes  Care Coordination Interventions:  Yes, provided   Follow up plan: Referral made to Pharmacy and RNCM Follow up call scheduled for 06/05/22    Encounter Outcome:  Pt. Visit Completed

## 2022-05-25 ENCOUNTER — Other Ambulatory Visit: Payer: Self-pay | Admitting: Internal Medicine

## 2022-05-25 ENCOUNTER — Telehealth: Payer: Self-pay

## 2022-05-25 DIAGNOSIS — R0981 Nasal congestion: Secondary | ICD-10-CM

## 2022-05-25 NOTE — Progress Notes (Signed)
[place] [facility])  [place] Team    05/25/2022  Austin Anderson 1951-08-12 [id number]  Reason for referral:  "Help with getting medications straight"  Referral source: [facility] Social Worker Current insurance: [employer]  Brief Summary:  [place] received a referral for "help with getting medications straight". Per discussion with referring social worker, "he cannot read at all and thus I am requesting assistance with recommendations about options for medication delivery method for adherence."  Per chart review, patient receives medication via [place]. I called patient with three unsuccessful outreach attempts on 9/6, 9/7, and 9/11.    [employer] and [employer], [employer] [facility]:  Script Ability: Talking Therapist, [email or web address]  [place]   ScripTalk: Utilizes a Child psychotherapist or reader to have label information read aloud.  Labels are designed with RFID text-to-speech technology.  ScriptView: Includes 3 Labeling Solutions: Ecologist, Dual Language, and Controlled Substance Safety Labels.  Labels are affixed to bottle for easy reading.  BrailleRX: Labels feature Grade 2 Braile.  Clear adhesive that goes over the pharmacy label.   Will close [facility] pharmacy case as no successful outreach attempts. I am happy to assist if patient needs medication education with specific needs identified or discuss with medication adherence.   Thank you for allowing pharmacy to be a part of this patient's care.  Kristeen Miss, PharmD Clinical Pharmacist [place] Cell: [phone]

## 2022-05-27 NOTE — Progress Notes (Signed)
RS 06/05/22 by SW

## 2022-05-31 ENCOUNTER — Other Ambulatory Visit: Payer: Self-pay | Admitting: Internal Medicine

## 2022-05-31 DIAGNOSIS — M109 Gout, unspecified: Secondary | ICD-10-CM

## 2022-06-05 ENCOUNTER — Encounter: Payer: Self-pay | Admitting: *Deleted

## 2022-06-05 ENCOUNTER — Telehealth: Payer: Self-pay | Admitting: *Deleted

## 2022-06-05 NOTE — Patient Outreach (Signed)
  Care Coordination   06/05/2022 Name: Austin Anderson MRN: [id number] DOB: 1951-01-22   Care Coordination Outreach Attempts:  An unsuccessful telephone outreach was attempted today to offer the patient information about available care coordination services as a benefit of their health plan.   Follow Up Plan:  Additional outreach attempts will be made to offer the patient care coordination information and services.   Encounter Outcome:  No Answer  Care Coordination Interventions Activated:  No   Care Coordination Interventions:  No, not indicated    Eduard Clos MSW, LCSW Licensed Clinical Social Worker      [phone]

## 2022-06-17 ENCOUNTER — Ambulatory Visit: Payer: Self-pay | Admitting: *Deleted

## 2022-06-17 NOTE — Patient Instructions (Signed)
Visit Information  Thank you for taking time to visit with me today. Please don't hesitate to contact me if I can be of assistance to you.   Following are the goals we discussed today:   Goals Addressed               This Visit's Progress     I am blind and need help (pt-stated)        Care Coordination Interventions:  Discussed pt's current in-home assistance- per pt [employer] aide helps him 6 days/week Discussed considering a request to increase hours (if not maxed out), consider PACE program Pt indicates his medications are not always "straight"- referred to Pharmacy who were unable to reach pt- will follow up with RNCM to assist Pt with HTN and DM- refer to [facility] for medical management support/assessment Solution-Focused Strategies employed:  Active listening / Reflection utilized  Consideration of in-home help encouraged : options discussed Discussed caregiver resources and support:           Our next appointment is by telephone on 07/16/22   Please call the care guide team at [phone] if you need to cancel or reschedule your appointment.   If you are experiencing a Mental Health or [place] or need someone to talk to, please call 911   The patient verbalized understanding of instructions, educational materials, and care plan provided today and DECLINED offer to receive copy of patient instructions, educational materials, and care plan.   Telephone follow up appointment with care management team member scheduled for:07/16/22   Eduard Clos MSW, LCSW Licensed Clinical Social Worker      [phone]

## 2022-06-17 NOTE — Patient Outreach (Signed)
  Care Coordination   Follow Up Visit Note   06/17/2022 Name: Austin Anderson MRN: [id number] DOB: 11-09-1950  Austin Anderson is a 71 y.o. year old male who sees Serita Butcher, MD for primary care. I spoke with  [name] by phone today.  What matters to the patients health and wellness today?  Assistance with medication- concerns with blindness and DM, etc.     Goals Addressed               This Visit's Progress     I am blind and need help (pt-stated)        Care Coordination Interventions:  Discussed pt's current in-home assistance- per pt [employer] aide helps him 6 days/week Discussed considering a request to increase hours (if not maxed out), consider PACE program Pt indicates his medications are not always "straight"- referred to Pharmacy who were unable to reach pt- will follow up with RNCM to assist Pt with HTN and DM- refer to [facility] for medical management support/assessment Solution-Focused Strategies employed:  Active listening / Reflection utilized  Consideration of in-home help encouraged : options discussed Discussed caregiver resources and support:           SDOH assessments and interventions completed:  Yes     Care Coordination Interventions Activated:  Yes  Care Coordination Interventions:  Yes, provided   Follow up plan: Referral made to [facility]  Follow up call scheduled for 07/16/22    Encounter Outcome:  Pt. Visit Completed   Eduard Clos MSW, LCSW Licensed Clinical Social Worker      [phone]

## 2022-06-25 ENCOUNTER — Ambulatory Visit: Payer: Self-pay

## 2022-06-25 NOTE — Patient Outreach (Signed)
  Care Coordination   06/25/2022 Name: ZAE KIRTZ MRN: [id number] DOB: September 14, 1951   Care Coordination Outreach Attempts:  An unsuccessful telephone outreach was attempted today to offer the patient information about available care coordination services as a benefit of their health plan.   Follow Up Plan:  Additional outreach attempts will be made to offer the patient care coordination information and services.   Encounter Outcome:  No Answer  Care Coordination Interventions Activated:  No   Care Coordination Interventions:  No, not indicated    Lazaro Arms RN, BSN, [place] [facility]   Phone: [phone]

## 2022-07-01 ENCOUNTER — Telehealth: Payer: Self-pay | Admitting: *Deleted

## 2022-07-01 NOTE — Chronic Care Management (AMB) (Signed)
  Care Coordination Note  07/01/2022 Name: Austin Anderson MRN: [id number] DOB: [birth year]  Austin Anderson is a 71 y.o. year old male who is a primary care patient of Serita Butcher, MD and is actively engaged with the care management team. I reached out to [name] by phone today to assist with re-scheduling a follow up visit with the RN Case Manager  Follow up plan: Unsuccessful telephone outreach attempt made. A HIPAA compliant phone message was left for the patient providing contact information and requesting a return call.   [place]  Direct Dial: [phone]

## 2022-07-08 NOTE — Chronic Care Management (AMB) (Signed)
  Care Coordination Note  07/08/2022 Name: TAYON PAREKH MRN: [id number] DOB: [birth year]  Austin Anderson is a 71 y.o. year old male who is a primary care patient of Serita Butcher, MD and is actively engaged with the care management team. I reached out to [name] by phone today to assist with re-scheduling an initial visit with the RN Case Manager  Follow up plan: Telephone appointment with care management team member scheduled for:07/17/22  [place]  Direct Dial: [phone]

## 2022-07-16 ENCOUNTER — Encounter: Payer: Self-pay | Admitting: *Deleted

## 2022-07-16 ENCOUNTER — Telehealth: Payer: Self-pay | Admitting: *Deleted

## 2022-07-16 NOTE — Patient Outreach (Signed)
  Care Coordination   07/16/2022 Name: Austin Anderson MRN: [id number] DOB: 05-11-1951   Care Coordination Outreach Attempts:  An unsuccessful telephone outreach was attempted today to offer the patient information about available care coordination services as a benefit of their health plan.   Follow Up Plan:  Additional outreach attempts will be made to offer the patient care coordination information and services.   Encounter Outcome:  No Answer  Care Coordination Interventions Activated:  Yes   Care Coordination Interventions:  Yes, provided    Eduard Clos MSW, LCSW Licensed Clinical Social Worker      [phone]

## 2022-07-17 ENCOUNTER — Telehealth: Payer: Self-pay

## 2022-07-17 NOTE — Patient Outreach (Signed)
  Care Coordination   07/17/2022 Name: Austin Anderson MRN: [id number] DOB: February 23, 1951   Care Coordination Outreach Attempts:  An unsuccessful telephone outreach was attempted today to offer the patient information about available care coordination services as a benefit of their health plan.   Follow Up Plan:  Additional outreach attempts will be made to offer the patient care coordination information and services.   Encounter Outcome:  No Answer  Care Coordination Interventions Activated:  Yes   Care Coordination Interventions:  Yes, provided    Eduard Clos MSW, LCSW Licensed Clinical Social Worker      [phone]

## 2022-07-17 NOTE — Patient Outreach (Signed)
  Care Coordination   07/17/2022 Name: Austin Anderson MRN: [id number] DOB: May 22, 1951   Care Coordination Outreach Attempts:  A second unsuccessful outreach was attempted today to offer the patient with information about available care coordination services as a benefit of their health plan.     Follow Up Plan:  Additional outreach attempts will be made to offer the patient care coordination information and services.   Encounter Outcome:  No Answer  Care Coordination Interventions Activated:  No   Care Coordination Interventions:  No, not indicated    Lazaro Arms RN, BSN, [place] [facility]   Phone: [phone]

## 2022-07-24 ENCOUNTER — Other Ambulatory Visit: Payer: Self-pay

## 2022-07-24 DIAGNOSIS — K5900 Constipation, unspecified: Secondary | ICD-10-CM

## 2022-07-26 ENCOUNTER — Other Ambulatory Visit: Payer: Self-pay | Admitting: Internal Medicine

## 2022-07-26 DIAGNOSIS — E119 Type 2 diabetes mellitus without complications: Secondary | ICD-10-CM

## 2022-08-17 ENCOUNTER — Telehealth: Payer: Self-pay | Admitting: *Deleted

## 2022-08-17 NOTE — Progress Notes (Unsigned)
  Care Coordination Note  08/17/2022 Name: [name] MRN: [id number] DOB: 1950/11/16  Austin Anderson is a 71 y.o. year old male who is a primary care patient of Serita Butcher, MD and is actively engaged with the care management team. I reached out to [name] by phone today to assist with re-scheduling Unsuccessful telephone outreach attempt made. A HIPAA compliant phone message was left for the patient providing contact information and requesting a return call.  with the RN Case Manager and Licensed Clinical Social Worker  Follow up plan: Unsuccessful telephone outreach attempt made. A HIPAA compliant phone message was left for the patient providing contact information and requesting a return call.    [place]  Direct Dial: [phone]

## 2022-08-20 NOTE — Progress Notes (Signed)
  Care Coordination Note  08/20/2022 Name: ALFIE RIDEAUX MRN: [id number] DOB: 1951-07-22  JEANCLAUDE WENTWORTH is a 71 y.o. year old male who is a primary care patient of Serita Butcher, MD and is actively engaged with the care management team. I reached out to [name] by phone today to assist with re-scheduling an initial visit with the RN Case Manager and follow up with SW.  Follow up plan: Telephone appointment with care management team member scheduled for: 08/25/22 with RNCM and 08/31/22 with [place]: [phone]

## 2022-08-25 ENCOUNTER — Telehealth: Payer: Self-pay

## 2022-08-25 NOTE — Patient Outreach (Signed)
  Care Coordination   08/25/2022 Name: Austin Anderson MRN: [id number] DOB: June 27, 1951   Care Coordination Outreach Attempts:  A third unsuccessful outreach was attempted today to offer the patient with information about available care coordination services as a benefit of their health plan.   Follow Up Plan:  No further outreach attempts will be made at this time. We have been unable to contact the patient to offer or enroll patient in care coordination services  Encounter Outcome:  Pt. Visit Completed   Care Coordination Interventions:  No, not indicated    Lazaro Arms RN, BSN, [place] [facility]   Phone: [phone]

## 2022-08-26 ENCOUNTER — Telehealth: Payer: Self-pay

## 2022-08-26 DIAGNOSIS — Z789 Other specified health status: Secondary | ICD-10-CM

## 2022-08-26 NOTE — Patient Instructions (Signed)
Visit Information  Thank you for taking time to visit with me today. Please don't hesitate to contact me if I can be of assistance to you.   Following are the goals we discussed today:   Goals Addressed             This Visit's Progress    COMPLETED: Care Coordination Activities- no followup required        Care Coordination Interventions: Active listening / Reflection utilized  Emotional Support Provided Problem [place] strategies reviewed Discussed/.Educated Care Coordination Program 2.   Discussed/.Educated Annual Wellness Visit 3.   Discussed/.Educated Social Determinates of Health 4.   Please inform PCP if services needed in the future         Our next appointment is by telephone on 1/12 at 2 pm  Please call the care guide team at [phone] if you need to cancel or reschedule your appointment.   If you are experiencing a Mental Health or [place] or need someone to talk to, please call 1-800-273-TALK (toll free, 24 hour hotline)   The patient verbalized understanding of instructions, educational materials, and care plan provided today.    Lazaro Arms RN, BSN, [place] [facility]   Phone: [phone]

## 2022-08-26 NOTE — Patient Outreach (Signed)
  Care Coordination   Initial Visit Note   08/26/2022 Name: SKYLAN LARA MRN: [id number] DOB: Nov 23, 1950  ZAYDE STROUPE is a 71 y.o. year old male who sees Serita Butcher, MD for primary care. I spoke with  [name] by phone today.  What matters to the patients health and wellness today?  Today, I received a call from Mr. Dethloff regarding his living situation. He is blind and lives alone, but he receives assistance from an aide who visits him for 2 to 3 hours six days a week to help him with daily chores and his medication. During our conversation, we went over his medications, and he mentioned that his blood sugar was under control. He also stated that he hasn't experienced any headaches or chest pain and feels that he is doing okay. However, he did express his desire to schedule a dental appointment. I will send a referral to the care guides to help him find a dentist. Furthermore, he knows that he will be receiving a call from the social worker on Monday and can get in contact with me if he needs any further assistance.    Goals Addressed             This Visit's Progress    COMPLETED: Care Coordination Activities- no followup required        Care Coordination Interventions: Active listening / Reflection utilized  Emotional Support Provided Problem [place] strategies reviewed Discussed/.Educated Care Coordination Program 2.   Discussed/.Educated Annual Wellness Visit 3.   Discussed/.Educated Social Determinates of Health 4.   Please inform PCP if services needed in the future         SDOH assessments and interventions completed:  Yes  SDOH Interventions Today    Flowsheet Row Most Recent Value  SDOH Interventions   Food Insecurity Interventions Intervention Not Indicated  Transportation Interventions Intervention Not Indicated        Care Coordination Interventions:  Yes, provided   Follow up plan:    1/12 2 pm   Encounter Outcome:  Pt. Visit  Completed   Lazaro Arms RN, BSN, [place] [facility]   Phone: [phone]

## 2022-08-28 ENCOUNTER — Telehealth: Payer: Self-pay | Admitting: *Deleted

## 2022-08-28 NOTE — Telephone Encounter (Signed)
   Telephone encounter was:  Unsuccessful.  08/28/2022 Name: LARIN WEISSBERG MRN: [id number] DOB: 1950-11-21  Unsuccessful outbound call made today to assist with:   Patient with visual impairment need to call and find a dentist that takes his insurance   Outreach Attempt:  1st Attempt  A HIPAA compliant voice message was left requesting a return call.  Instructed patient to call back at [phone].  [place] [phone] [address] , [place] [address] Email : [email or web address]

## 2022-08-31 ENCOUNTER — Encounter: Payer: Medicare Other | Admitting: *Deleted

## 2022-08-31 ENCOUNTER — Telehealth: Payer: Self-pay | Admitting: *Deleted

## 2022-08-31 ENCOUNTER — Ambulatory Visit: Payer: Self-pay | Admitting: *Deleted

## 2022-08-31 NOTE — Telephone Encounter (Signed)
   Telephone encounter was:  Unsuccessful.  08/31/2022 Name: Austin Anderson MRN: [id number] DOB: 06-14-1951  Unsuccessful outbound call made today to assist with:   Dentist  Outreach Attempt:  2nd Attempt  A HIPAA compliant voice message was left requesting a return call.  Instructed patient to call back at [phone]. [place] [phone] [address] , [place] [address] Email : [email or web address]

## 2022-08-31 NOTE — Patient Outreach (Signed)
  Care Coordination   Follow Up Visit Note   08/31/2022 Name: Austin Anderson MRN: [id number] DOB: 04/05/1951  Austin Anderson is a 71 y.o. year old male who sees Austin Butcher, MD for primary care. I spoke with  Austin Anderson by phone today.  What matters to the patients health and wellness today?  Need dentist.    Goals Addressed               This Visit's Progress     I am blind and need help (pt-stated)        Care Coordination Interventions:  Discussed pt's current in-home assistance- per pt [employer] aide helps him 6 days/week Discussed considering a request to increase hours (if not maxed out), consider PACE program Pt is seeking a dentist and referral placed for Care Guide Pt previously stated his medications not always "straight"- referred to Pharmacy t Pt with HTN and DM- refer to Austin Anderson for medical management support/assessment Solution-Focused Strategies employed:  Active listening / Reflection utilized  Consideration of in-home help encouraged : options discussed Discussed caregiver resources and support:           SDOH assessments and interventions completed:  Yes     Care Coordination Interventions:  Yes, provided   Follow up plan: Follow up call scheduled for 09/28/22    Encounter Outcome:  Pt. Visit Completed

## 2022-08-31 NOTE — Telephone Encounter (Signed)
   Telephone encounter was:  Successful.  08/31/2022 Name: Austin Anderson MRN: [id number] DOB: 1951/08/12  Austin Anderson is a 71 y.o. year old male who is a primary care patient of Serita Butcher, MD . The community resource team was consulted for assistance with  Dentists   Care guide performed the following interventions: Patient provided with information about care guide support team and interviewed to confirm resource needs.patient provided dental providers he asked that he record the information and call them on his own provided him with the requested call back information   Follow Up Plan:  No further follow up planned at this time. The patient has been provided with needed resources.  [facility] [phone] [address] , [place] [address] Email : [email or web address]

## 2022-09-21 ENCOUNTER — Other Ambulatory Visit: Payer: Self-pay | Admitting: *Deleted

## 2022-09-21 DIAGNOSIS — I7772 Dissection of iliac artery: Secondary | ICD-10-CM

## 2022-09-28 ENCOUNTER — Ambulatory Visit: Payer: Self-pay | Admitting: *Deleted

## 2022-09-29 NOTE — Patient Instructions (Signed)
Visit Information  Thank you for taking time to visit with me today. Please don't hesitate to contact me if I can be of assistance to you.   Following are the goals we discussed today:   Goals Addressed               This Visit's Progress     I am blind and need help (pt-stated)        Care Coordination Interventions:  Pt reports he continues to have in-home assistance- per pt [employer] aide helps him 6 days/week Pt is seeking a dentist and received Medicaid providers list from [place] encouraged him to have his aide or other family/friend assist with calling to find one that can schedule him soon as he reports he has "several rotten teeth"  Pt reports getting Food Stamps Solution-Focused Strategies employed:  Active listening / Reflection utilized  Consideration of in-home help encouraged : options discussed Discussed caregiver resources and support: stressed for him to get help with calling to schedule with Medicaid accepting Dentist          Our next appointment is by telephone on1/23/24   Please call the care guide team at [phone] if you need to cancel or reschedule your appointment.   If you are experiencing a Mental Health or [place] or need someone to talk to, please call 911   The patient verbalized understanding of instructions, educational materials, and care plan provided today and DECLINED offer to receive copy of patient instructions, educational materials, and care plan.   Telephone follow up appointment with care management team member scheduled for: 10/06/22  Eduard Clos, MSW, [place] Worker [employer] [phone]

## 2022-09-29 NOTE — Patient Outreach (Signed)
  Care Coordination   Follow Up Visit Note   09/29/2022 Name: Austin Anderson MRN: [id number] DOB: May 16, 1951  Austin Anderson is a 72 y.o. year old male who sees Serita Butcher, MD for primary care. I spoke with  [name] by phone today.  What matters to the patients health and wellness today?  Need to see dentist.    Goals Addressed               This Visit's Progress     I am blind and need help (pt-stated)        Care Coordination Interventions:  Pt reports he continues to have in-home assistance- per pt P[employer] aide helps him 6 days/week Pt is seeking a dentist and received Medicaid providers list from [place] encouraged him to have his aide or other family/friend assist with calling to find one that can schedule him soon as he reports he has "several rotten teeth"  Pt reports getting Food Stamps Solution-Focused Strategies employed:  Active listening / Reflection utilized  Consideration of in-home help encouraged : options discussed Discussed caregiver resources and support: stressed for him to get help with calling to schedule with Medicaid accepting Dentist          SDOH assessments and interventions completed:  Yes     Care Coordination Interventions:  Yes, provided   Follow up plan: Follow up call scheduled for 10/06/22    Encounter Outcome:  Pt. Visit Completed

## 2022-10-06 ENCOUNTER — Ambulatory Visit (HOSPITAL_COMMUNITY)
Admission: RE | Admit: 2022-10-06 | Discharge: 2022-10-06 | Disposition: A | Discharge status: Home / Self Care | Payer: 59 | Source: Ambulatory Visit | Attending: Vascular Surgery | Admitting: Vascular Surgery

## 2022-10-06 ENCOUNTER — Ambulatory Visit: Payer: Self-pay | Admitting: *Deleted

## 2022-10-06 ENCOUNTER — Ambulatory Visit (INDEPENDENT_AMBULATORY_CARE_PROVIDER_SITE_OTHER): Payer: 59 | Admitting: Physician Assistant

## 2022-10-06 VITALS — BP 149/89 | HR 83 | Temp 98.1°F | Resp 20 | Ht 65.0 in | Wt 175.0 lb

## 2022-10-06 DIAGNOSIS — I7772 Dissection of iliac artery: Secondary | ICD-10-CM

## 2022-10-06 NOTE — Patient Outreach (Signed)
  Care Coordination   Follow Up Visit Note   10/06/2022 Name: Austin Anderson MRN: [id number] DOB: Feb 07, 1951  Austin Anderson is a 72 y.o. year old male who sees Serita Butcher, MD for primary care. I spoke with  [name] by phone today.  What matters to the patients health and wellness today?  "Went to vein doctor today and all is ok"    Goals Addressed               This Visit's Progress     I am blind and need help (pt-stated)        Care Coordination Interventions:  Pt reports he continues to have in-home assistance- per pt PCS aide helps him 6 days/week Pt has not followed up for seeking a dentist- stressed the importance of getting his "rotting teeth" addressed.  CSW offered to assist- pt declines my assistance - CSW encouraged him to have his aide or other family/friend assist with calling to find one that can schedule him soon and that takes his Insurance. Pt reports getting Food Stamps Solution-Focused Strategies employed:  Active listening / Reflection utilized  Consideration of in-home help encouraged : options discussed Discussed caregiver resources and support: stressed for him to get help with calling to schedule with Medicaid accepting Dentist          SDOH assessments and interventions completed:  Yes     Care Coordination Interventions:  Yes, provided   Follow up plan: Follow up call scheduled for 10/20/22    Encounter Outcome:  Pt. Visit Completed

## 2022-10-06 NOTE — Progress Notes (Signed)
Office Note   History of Present Illness   Austin Anderson is a 72 y.o. 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  This dissection was incidentally discovered on CT scan during workup for his abdominal pain in 2021.  He denies any history of claudication, nonhealing wounds, or rest pain.  His most recent CT in 2023 demonstrated an unchanged dissection flap in the left external iliac artery that did not limit flow.  At follow-up today, he is doing well.  He denies any recent changes to his health.  He denies any rest pain, claudication, nonhealing wounds of lower extremities.  Current Outpatient Medications  Medication Sig Dispense Refill   albuterol (PROVENTIL HFA) 108 (90 Base) MCG/ACT inhaler Inhale 1-2 puffs into the lungs every 6 (six) hours as needed for wheezing or shortness of breath. 6.7 g 2   allopurinol (ZYLOPRIM) 100 MG tablet TAKE 3 TABLETS BY MOUTH DAILY 300 tablet 2   atorvastatin (LIPITOR) 40 MG tablet TAKE 1 TABLET BY MOUTH DAILY 100 tablet 2   bisacodyl ([employer] GENTLE LAXATIVE) 10 MG suppository PLACE 1 SUPPOSITORY RECTALLY AS NEEDED FOR MODERATE CONSTIPATION OR SEVERE CONSTIPATION. 16 suppository 0   [facility] SENNA 8.6 MG tablet TAKE 1 TABLET (8.6 MG TOTAL) BY MOUTH AT BEDTIME. 120 tablet 0   fluticasone (FLONASE) 50 MCG/ACT nasal spray PLACE 1 SPRAY INTO BOTH NOSTRILS DAILY AS NEEDED FOR ALLERGIES OR RHINITIS. 48 mL 1   hydrochlorothiazide (HYDRODIURIL) 25 MG tablet Take 1 tablet (25 mg total) by mouth daily. 90 tablet 3   lactulose (CHRONULAC) 10 GM/15ML solution Take 15 mLs (10 g total) by mouth 2 (two) times daily as needed for mild constipation. 473 mL 2   metFORMIN (GLUCOPHAGE) 1000 MG tablet TAKE 1 TABLET BY MOUTH TWICE  DAILY WITH A MEAL 200 tablet 2   olmesartan (BENICAR) 20 MG tablet Take 1 tablet (20 mg total) by mouth daily. 90 tablet 2   No current facility-administered medications for this visit.    REVIEW OF SYSTEMS  (negative unless checked):   Cardiac:  '[]'$  Chest pain or chest pressure? '[]'$  Shortness of breath upon activity? '[]'$  Shortness of breath when lying flat? '[]'$  Irregular heart rhythm?  Vascular:  '[]'$  Pain in calf, thigh, or hip brought on by walking? '[]'$  Pain in feet at night that wakes you up from your sleep? '[]'$  Blood clot in your veins? '[]'$  Leg swelling?  Pulmonary:  '[]'$  Oxygen at home? '[]'$  Productive cough? '[]'$  Wheezing?  Neurologic:  '[]'$  Sudden weakness in arms or legs? '[]'$  Sudden numbness in arms or legs? '[]'$  Sudden onset of difficult speaking or slurred speech? '[]'$  Temporary loss of vision in one eye? '[]'$  Problems with dizziness?  Gastrointestinal:  '[]'$  Blood in stool? '[]'$  Vomited blood?  Genitourinary:  '[]'$  Burning when urinating? '[]'$  Blood in urine?  Psychiatric:  '[]'$  Major depression  Hematologic:  '[]'$  Bleeding problems? '[]'$  Problems with blood clotting?  Dermatologic:  '[]'$  Rashes or ulcers?  Constitutional:  '[]'$  Fever or chills?  Ear/Nose/Throat:  '[]'$  Change in hearing? '[]'$  Nose bleeds? '[]'$  Sore throat?  Musculoskeletal:  '[]'$  Back pain? '[]'$  Joint pain? '[]'$  Muscle pain?   Physical Examination   Vitals:   10/06/22 0844  BP: (!) 149/89  Pulse: 83  Resp: 20  Temp: 98.1 F (36.7 C)  SpO2: 95%  Weight: 175 lb (79.4 kg)  Height: '5\' 5"'$  (1.651 m)   Body mass index is 29.12 kg/m.  General:  WDWN in NAD; vital signs documented above Gait: Not observed HENT: WNL, normocephalic Pulmonary: normal non-labored breathing Cardiac: regular rate and rhythm Abdomen: soft, NT, no masses Skin: without rashes Vascular Exam/Pulses: Palpable femoral pulses bilaterally. DP pulses 1+ bilaterally Extremities: without ischemic changes, without gangrene , without cellulitis; without open wounds;  Musculoskeletal: no muscle wasting or atrophy  Neurologic: A&O X 3;  No focal weakness or paresthesias are detected Psychiatric:  The pt has Normal affect.  Non-Invasive Vascular  Imaging Aortoiliac Duplex (10/06/2022)  +-------------+-------+----------+-------+---------+--------+--------------  ----+  Location    AP (cm)Trans (cm)PSV    Waveform ThrombusComments                                           (cm/s)                                       +-------------+-------+----------+-------+---------+--------+--------------  ----+  Proximal    2.30             82     triphasic                             +-------------+-------+----------+-------+---------+--------+--------------  ----+  Mid         1.74             95     triphasic                             +-------------+-------+----------+-------+---------+--------+--------------  ----+  Distal      1.67             99     triphasic                             +-------------+-------+----------+-------+---------+--------+--------------  ----+  LT CIA Prox  0.8              102    triphasic                             +-------------+-------+----------+-------+---------+--------+--------------  ----+  LT CIA Mid                    110    triphasic                             +-------------+-------+----------+-------+---------+--------+--------------  ----+  LT CIA Distal                 76     triphasic                             +-------------+-------+----------+-------+---------+--------+--------------  ----+  LT EIA Prox  0.6              53     triphasic                             +-------------+-------+----------+-------+---------+--------+--------------  ----+  LT EIA Mid   1.4  54     triphasic        ectatic              +-------------+-------+----------+-------+---------+--------+--------------  ----+  LT EIA Distal0.7              96     triphasic         Nonobstructing                                                            dissection  flap                                                            visualized           +-------------+-------+----------+-------+---------+--------+--------------  ----+    Medical Decision Making   Austin Anderson is a 72 y.o. 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  Based on the patient's vascular studies, he has a chronic nonobstructing dissection flap in the left distal external iliac artery.  He has triphasic flow throughout his common and external iliac arteries. This is unchanged since 2022. He denies any rest pain, nonhealing wounds of the lower extremities, claudication. He has palpable femoral pulses and 1+ DP pulses bilaterally He can follow-up with our office in 1 year with repeat aortoiliac duplex study   Vicente Serene PA-C [facility]: [facility] MD: Carlis Abbott

## 2022-10-15 ENCOUNTER — Other Ambulatory Visit: Payer: Self-pay

## 2022-10-19 MED ORDER — ALBUTEROL SULFATE HFA 108 (90 BASE) MCG/ACT IN AERS: 1.0000 | INHALATION_SPRAY | Freq: Four times a day (QID) | RESPIRATORY_TRACT | 2 refills | Status: DC | PRN

## 2022-10-20 ENCOUNTER — Ambulatory Visit: Payer: Self-pay | Admitting: *Deleted

## 2022-10-21 NOTE — Patient Instructions (Signed)
Visit Information  Thank you for taking time to visit with me today. Please don't hesitate to contact me if I can be of assistance to you.   Following are the goals we discussed today:   Goals Addressed               This Visit's Progress     I am blind and need help (pt-stated)        Care Coordination Interventions:  Pt reports he wen to see the "vein Doctor" and was not told anything was wrong and no follow up planned (per pt report) Pt continues to have in-home assistance- per pt [employer] aide helps him 6 days/week Pt has not followed up for seeking a dentist- stressed the importance of getting his "rotting teeth" addressed.  CSW offered to assist- pt declines my assistance - CSW encouraged him to have his aide or other family/friend assist with calling to find one that can schedule him soon and that takes his Insurance.        Pt reports getting Food Stamps Solution-Focused Strategies employed:  Active listening / Reflection utilized  Consideration of in-home help encouraged : options discussed Discussed caregiver resources and support: stressed for him to get help with calling to schedule with Medicaid accepting Dentist          Our next appointment is by telephone on  11/10/22 Please call the care guide team at [phone] if you need to cancel or reschedule your appointment.   If you are experiencing a Mental Health or [place] or need someone to talk to, please call the Suicide and Crisis Lifeline: 988 call 911   The patient verbalized understanding of instructions, educational materials, and care plan provided today and DECLINED offer to receive copy of patient instructions, educational materials, and care plan.   Telephone follow up appointment with care management team member scheduled for:11/10/22  Eduard Clos, MSW, [place] Worker [employer] [phone]

## 2022-10-21 NOTE — Patient Outreach (Signed)
  Care Coordination   Follow Up Visit Note   10/21/2022 Name: Austin Anderson MRN: [id number] DOB: [birth year]  Austin Anderson is a 72 y.o. year old male who sees Serita Butcher, MD for primary care. I spoke with  [name] by phone today.  What matters to the patients health and wellness today?  "Need to get some rotten teeth taken out"    Goals Addressed               This Visit's Progress     I am blind and need help (pt-stated)        Care Coordination Interventions:  Pt reports he wen to see the "vein Doctor" and was not told anything was wrong and no follow up planned (per pt report) Pt continues to have in-home assistance- per pt [employer] aide helps him 6 days/week Pt has not followed up for seeking a dentist- stressed the importance of getting his "rotting teeth" addressed.  CSW offered to assist- pt declines my assistance - CSW encouraged him to have his aide or other family/friend assist with calling to find one that can schedule him soon and that takes his Insurance.        Pt reports getting Food Stamps Solution-Focused Strategies employed:  Active listening / Reflection utilized  Consideration of in-home help encouraged : options discussed Discussed caregiver resources and support: stressed for him to get help with calling to schedule with Medicaid accepting Dentist          SDOH assessments and interventions completed:  Yes     Care Coordination Interventions:  Yes, provided   Follow up plan: Follow up call scheduled for 11/10/22    Encounter Outcome:  Pt. Visit Completed

## 2022-11-18 ENCOUNTER — Ambulatory Visit: Payer: Self-pay | Admitting: *Deleted

## 2022-11-18 NOTE — Patient Outreach (Signed)
  Care Coordination   Follow Up Visit Note   11/18/2022 Name: Austin Anderson MRN: [id number] DOB: [birth year]  Austin Anderson is a 72 y.o. year old male who sees Serita Butcher, MD for primary care. I spoke with  Austin Anderson by phone today.  What matters to the patients health and wellness today?  Wants/needs to get a dentist for extractions-    Goals Addressed               This Visit's Progress     COMPLETED: I am blind and need help (pt-stated)         Activities and task to complete in order to accomplish goals.   Call dentist to follow up on getting extractions appointment!    Continue with in-home assistance- per pt P[employer] aide helps him 6 days/week Pt has not followed up for seeking a dentist- stressed the importance of getting his   addressed- he declines CSW assistance.  CSW offered to assist- pt declines my assistance - CSW encouraged him to have his aide or other family/friend assist with calling to find one that can schedule him soon and that takes his Insurance.    Pt continues to have in-home assistance- per pt P[employer] aide helps him 6 days/week Pt has not followed up for seeking a dentist- stressed the importance of getting his "rotting teeth" addressed.  CSW offered to assist- pt declines my assistance - CSW encouraged him to have his aide or other family/friend assist with calling to find one that can schedule him soon and that takes his Insurance.        CSW advised pt to ask PCP to re-consult if further needs arise CSW will sign off as pt has been provided with necessary information to pursue support needed            SDOH assessments and interventions completed:  Yes     Care Coordination Interventions:  Yes, provided  Interventions Today    Flowsheet Row Most Recent Value  Chronic Disease   Chronic disease during today's visit Diabetes, Other  [visually impaired]  General Interventions   General Interventions Discussed/Reviewed [employer], Communication  with        Follow up plan: No further intervention required.   Encounter Outcome:  Pt. Visit Completed

## 2022-11-18 NOTE — Patient Instructions (Signed)
Visit Information  Thank you for taking time to visit with me today. Please don't hesitate to contact me if I can be of assistance to you.   Following are the goals we discussed today:   Goals Addressed               This Visit's Progress     COMPLETED: I am blind and need help (pt-stated)         Activities and task to complete in order to accomplish goals.   Call dentist to follow up on getting extractions appointment!    Continue with in-home assistance- per pt P[employer] aide helps him 6 days/week Pt has not followed up for seeking a dentist- stressed the importance of getting his   addressed- he declines CSW assistance.  CSW offered to assist- pt declines my assistance - CSW encouraged him to have his aide or other family/friend assist with calling to find one that can schedule him soon and that takes his Insurance.    Pt continues to have in-home assistance- per pt PCS aide helps him 6 days/week Pt has not followed up for seeking a dentist- stressed the importance of getting his "rotting teeth" addressed.  CSW offered to assist- pt declines my assistance - CSW encouraged him to have his aide or other family/friend assist with calling to find one that can schedule him soon and that takes his Insurance.        CSW advised pt to ask PCP to re-consult if further needs arise CSW will sign off as pt has been provided with necessary information to pursue support needed               If you are experiencing a Mental Health or [place] or need someone to talk to, please call 911   The patient verbalized understanding of instructions, educational materials, and care plan provided today and DECLINED offer to receive copy of patient instructions, educational materials, and care plan.   No further follow up required:    Eduard Clos, MSW, [place] Worker [employer] [phone]

## 2022-12-10 ENCOUNTER — Encounter: Payer: 59 | Admitting: Student

## 2022-12-21 ENCOUNTER — Other Ambulatory Visit: Payer: Self-pay

## 2022-12-21 DIAGNOSIS — I10 Essential (primary) hypertension: Secondary | ICD-10-CM

## 2022-12-21 MED ORDER — HYDROCHLOROTHIAZIDE 25 MG PO TABS: 25.0000 mg | ORAL_TABLET | Freq: Every day | ORAL | 3 refills | Status: DC

## 2023-01-11 ENCOUNTER — Other Ambulatory Visit: Payer: Self-pay

## 2023-01-11 DIAGNOSIS — E119 Type 2 diabetes mellitus without complications: Secondary | ICD-10-CM

## 2023-01-13 MED ORDER — METFORMIN HCL 1000 MG PO TABS: 1000.0000 mg | ORAL_TABLET | Freq: Two times a day (BID) | ORAL | 3 refills | Status: DC

## 2023-02-04 ENCOUNTER — Encounter: Payer: Self-pay | Admitting: Student

## 2023-02-04 ENCOUNTER — Ambulatory Visit (INDEPENDENT_AMBULATORY_CARE_PROVIDER_SITE_OTHER): Payer: 59 | Admitting: *Deleted

## 2023-02-04 ENCOUNTER — Ambulatory Visit (INDEPENDENT_AMBULATORY_CARE_PROVIDER_SITE_OTHER): Payer: 59 | Admitting: Student

## 2023-02-04 VITALS — BP 145/89 | HR 67 | Wt 182.4 lb

## 2023-02-04 VITALS — BP 142/84 | HR 71

## 2023-02-04 DIAGNOSIS — E1169 Type 2 diabetes mellitus with other specified complication: Secondary | ICD-10-CM | POA: Diagnosis not present

## 2023-02-04 DIAGNOSIS — E785 Hyperlipidemia, unspecified: Secondary | ICD-10-CM

## 2023-02-04 DIAGNOSIS — Z0000 Encounter for general adult medical examination without abnormal findings: Secondary | ICD-10-CM | POA: Diagnosis not present

## 2023-02-04 DIAGNOSIS — R109 Unspecified abdominal pain: Secondary | ICD-10-CM | POA: Diagnosis not present

## 2023-02-04 DIAGNOSIS — I10 Essential (primary) hypertension: Secondary | ICD-10-CM

## 2023-02-04 DIAGNOSIS — E119 Type 2 diabetes mellitus without complications: Secondary | ICD-10-CM | POA: Diagnosis not present

## 2023-02-04 LAB — POCT GLYCOSYLATED HEMOGLOBIN (HGB A1C): Hemoglobin A1C: 6.5 % — AB (ref 4.0–5.6)

## 2023-02-04 LAB — GLUCOSE, CAPILLARY
Glucose-Capillary: 115 mg/dL — ABNORMAL HIGH (ref 70–99)
Glucose-Capillary: 123 mg/dL — ABNORMAL HIGH (ref 70–99)

## 2023-02-04 MED ORDER — OLMESARTAN MEDOXOMIL 40 MG PO TABS: 40.0000 mg | ORAL_TABLET | Freq: Every day | ORAL | 11 refills | Status: DC

## 2023-02-04 NOTE — Assessment & Plan Note (Signed)
Patient is taking atorvastatin for primary prevention.  -Repeat annual lipid panel

## 2023-02-04 NOTE — Progress Notes (Signed)
Subjective:   Austin Anderson is a 72 y.o. male who presents for an Initial Medicare Annual Wellness Visit.  Review of Systems    Defer to pcp       Objective:    Today's Vitals   02/04/23 0940 02/04/23 0941 02/04/23 1358  BP: (!) 145/89 (!) 142/84   Pulse: 67 71   PainSc:   4    There is no height or weight on file to calculate BMI.     02/04/2023    1:58 PM 02/04/2023    9:42 AM 05/13/2022    1:28 PM 03/16/2022    1:27 PM 02/02/2022    1:30 PM 10/16/2021    2:11 PM 01/09/2021    9:38 AM  Advanced Directives  Does Patient Have a Medical Advance Directive? No No No No No No No  Would patient like information on creating a medical advance directive? No - Patient declined No - Patient declined No - Patient declined No - Patient declined No - Patient declined No - Patient declined No - Patient declined    Current Medications (verified) Outpatient Encounter Medications as of 02/04/2023  Medication Sig   albuterol (PROVENTIL HFA) 108 (90 Base) MCG/ACT inhaler Inhale 1-2 puffs into the lungs every 6 (six) hours as needed for wheezing or shortness of breath.   allopurinol (ZYLOPRIM) 100 MG tablet TAKE 3 TABLETS BY MOUTH DAILY   atorvastatin (LIPITOR) 40 MG tablet TAKE 1 TABLET BY MOUTH DAILY   bisacodyl ([employer] GENTLE LAXATIVE) 10 MG suppository PLACE 1 SUPPOSITORY RECTALLY AS NEEDED FOR MODERATE CONSTIPATION OR SEVERE CONSTIPATION.   [facility] SENNA 8.6 MG tablet TAKE 1 TABLET (8.6 MG TOTAL) BY MOUTH AT BEDTIME.   fluticasone (FLONASE) 50 MCG/ACT nasal spray PLACE 1 SPRAY INTO BOTH NOSTRILS DAILY AS NEEDED FOR ALLERGIES OR RHINITIS.   hydrochlorothiazide (HYDRODIURIL) 25 MG tablet Take 1 tablet (25 mg total) by mouth daily.   olmesartan (BENICAR) 40 MG tablet Take 1 tablet (40 mg total) by mouth daily.   No facility-administered encounter medications on file as of 02/04/2023.    Allergies (verified) Patient has no known allergies.   History: Past Medical History:  Diagnosis Date   Asthma     Reported by patient's last physician before coming to [facility].  No PFT's on file.    Blind    Diabetes mellitus, type 2 (HCC)    Diverticulosis    Gout    Hypertension    Prostate cancer [facility])    Prostatic adenocarcinoma, Gleason score 7 (3+4), s/p radical prostatectomy 12/04/10 by Dr. Laverle Patter.    No past surgical history on file. Family History  Problem Relation Age of Onset   Heart attack Father 58   Heart disease Mother    Diabetes Sister    Social History   Socioeconomic History   Marital status: Single    Spouse name: Not on file   Number of children: Not on file   Years of education: Not on file   Highest education level: Not on file  Occupational History   Not on file  Tobacco Use   Smoking status: Former    Types: Cigarettes   Smokeless tobacco: Never   Tobacco comments:    30 years ago  Substance and Sexual Activity   Alcohol use: No    Alcohol/week: 0.0 standard drinks of alcohol   Drug use: No   Sexual activity: Not on file  Other Topics Concern   Not on file  Social History  Narrative   The patient lives alone in [place].  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  The patient is on medicaid/medicare, and receives a disability check.  The patient has a sister in [place].  The patient has a 10th grade education.  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  The patient lost his vision at age 67, and now can only barely see light, but no colors or shapes.  The patient has an aide who comes to his house every week to help him sort his medications.   Social Determinants of Health   Financial Resource Strain: Medium Risk (02/04/2023)   Overall Financial Resource Strain (CARDIA)    Difficulty of Paying Living Expenses: Somewhat hard  Food Insecurity: No Food Insecurity (05/19/2022)   Hunger Vital Sign    Worried About Running Out of Food in the Last Year: Never true    Ran Out  of Food in the Last Year: Never true  Transportation Needs: No Transportation Needs (05/19/2022)   PRAPARE - Administrator, Civil Service (Medical): No    Lack of Transportation (Non-Medical): No  Physical Activity: Inactive (02/04/2023)   Exercise Vital Sign    Days of Exercise per Week: 0 days    Minutes of Exercise per Session: 0 min  Stress: No Stress Concern Present (02/04/2023)   [employer] of Occupational Health - Occupational Stress Questionnaire    Feeling of Stress : Only a little  Social Connections: Socially Isolated (02/04/2023)   Social Connection and Isolation Panel [NHANES]    Frequency of Communication with Friends and Family: Never    Frequency of Social Gatherings with Friends and Family: Never    Attends Religious Services: Never    Diplomatic Services operational officer: No    Attends Engineer, structural: Never    Marital Status: Never married    Tobacco Counseling Counseling given: Not Answered Tobacco comments: 30 years ago   Clinical Intake:  Pre-visit preparation completed: Yes  Pain : 0-10 Pain Score: 4  Pain Type: Chronic pain Pain Location: Abdomen Pain Orientation: Lower Pain Descriptors / Indicators: Cramping Pain Onset: More than a month ago Pain Frequency: Intermittent Pain Relieving Factors: not eating much Effect of Pain on Daily Activities: none  Pain Relieving Factors: not eating much  BMI - recorded: 30.35 Nutritional Status: BMI > 30  Obese Nutritional Risks: None Diabetes: Yes CBG done?: Yes CBG resulted in Enter/ Edit results?: Yes Did pt. bring in CBG monitor from home?: No  How often do you need to have someone help you when you read instructions, pamphlets, or other written materials from your doctor or pharmacy?: 5 - Always What is the last grade level you completed in school?: pt Blind  Diabetic?NO  Interpreter Needed?: No  Information entered by :: [email or web address]   Activities of Daily  Living    02/04/2023    2:24 PM 02/04/2023    9:41 AM  In your present state of health, do you have any difficulty performing the following activities:  Hearing? 0 0  Vision? 1 1  Difficulty concentrating or making decisions? 0 0  Walking or climbing stairs? 1 1  Dressing or bathing? 1 1  Doing errands, shopping? 1 1    Patient Care Team: Crissie Sickles, MD as PCP - General  Indicate any recent Medical Services you may have received from  other than [facility] providers in the past year (date may be approximate).     Assessment:   This is a routine wellness examination for [place].  Hearing/Vision screen No results found.  Dietary issues and exercise activities discussed:     Goals Addressed   None   Depression Screen    02/04/2023    2:24 PM 02/04/2023    9:54 AM 02/02/2022    4:19 PM 11/05/2021   10:48 AM 10/16/2021    2:19 PM 07/16/2021    2:13 PM 05/08/2021    9:54 AM  PHQ 2/9 Scores  PHQ - 2 Score 1 1 0 1 1 1 1   PHQ- 9 Score 7 7   8 4 5     Fall Risk    02/04/2023    2:23 PM 08/26/2022    2:23 PM 05/19/2022   10:20 AM 05/13/2022    1:27 PM 03/16/2022    1:27 PM  Fall Risk   Falls in the past year? 0   1 1  Number falls in past yr: 0 0  1 1  Injury with Fall? 0 0  0 0  Risk for fall due to : Impaired vision;Other (Comment) Impaired balance/gait Impaired vision Impaired balance/gait;Impaired vision;History of fall(s) Impaired balance/gait;History of fall(s);Impaired vision  Risk for fall due to: Comment "off balanced" denies actual fall      Follow up Falls evaluation completed Falls evaluation completed;Education provided;Falls prevention discussed  Falls evaluation completed Falls evaluation completed    FALL RISK PREVENTION PERTAINING TO THE HOME:  Any stairs in or around the home? Yes  If so, are there any without handrails? No  Home free of loose throw rugs in walkways, pet beds, electrical cords, etc? No  Adequate lighting in your home to reduce risk of falls?  " I  think so"   ASSISTIVE DEVICES UTILIZED TO PREVENT FALLS:  Life alert? No  Use of a cane, walker or w/c? Yes  Grab bars in the bathroom? No  Shower chair or bench in shower? No  Elevated toilet seat or a handicapped toilet? Yes   TIMED UP AND GO:  Was the test performed? No .  Length of time to ambulate 10 feet: 0 sec.   Gait slow and steady with assistive device  Cognitive Function:        02/04/2023    4:17 PM  6CIT Screen  What Year? 0 points  What month? 0 points  What time? 0 points  Count back from 20 0 points  Months in reverse 4 points  Repeat phrase 2 points  Total Score 6 points    Immunizations Immunization History  Administered Date(s) Administered   Fluad Quad(high Dose 65+) 07/16/2021   Influenza Split 11/30/2012   Influenza,inj,Quad PF,6+ Mos 08/16/2013, 08/01/2014, 08/28/2015, 07/08/2016, 07/21/2017, 08/17/2018, 07/26/2019   Pneumococcal Conjugate-13 03/01/2019   Pneumococcal Polysaccharide-23 10/15/2016   Tdap 07/21/2017    TDAP status: Up to date  Flu Vaccine status: Up to date  Pneumococcal vaccine status: Declined,  Education has been provided regarding the importance of this vaccine but patient still declined. Advised may receive this vaccine at local pharmacy or Health Dept. Aware to provide a copy of the vaccination record if obtained from local pharmacy or Health Dept. Verbalized acceptance and understanding.   Covid-19 vaccine status: Information provided on how to obtain vaccines.   Qualifies for Shingles Vaccine? Yes   Zostavax completed No   Shingrix Completed?: No.    Education has been provided  regarding the importance of this vaccine. Patient has been advised to call insurance company to determine out of pocket expense if they have not yet received this vaccine. Advised may also receive vaccine at local pharmacy or Health Dept. Verbalized acceptance and understanding.  Screening Tests Health Maintenance  Topic Date Due   COVID-19  Vaccine (1) Never done   Zoster Vaccines- Shingrix (1 of 2) Never done   Diabetic kidney evaluation - Urine ACR  05/08/2022   Diabetic kidney evaluation - eGFR measurement  02/03/2023   LIPID PANEL  02/03/2023   Pneumonia Vaccine 40+ Years old (3 of 3 - PPSV23 or PCV20) 02/04/2024 (Originally 10/15/2021)   INFLUENZA VACCINE  04/15/2023   HEMOGLOBIN A1C  05/07/2023   FOOT EXAM  02/04/2024   Medicare Annual Wellness (AWV)  02/04/2024   Colonoscopy  10/12/2024   DTaP/Tdap/Td (2 - Td or Tdap) 07/22/2027   Hepatitis C Screening  Completed   HPV VACCINES  Aged Out    Health Maintenance  Health Maintenance Due  Topic Date Due   COVID-19 Vaccine (1) Never done   Zoster Vaccines- Shingrix (1 of 2) Never done   Diabetic kidney evaluation - Urine ACR  05/08/2022   Diabetic kidney evaluation - eGFR measurement  02/03/2023   LIPID PANEL  02/03/2023    Colorectal cancer screening: Type of screening: Colonoscopy. Completed 10/12/2014. Repeat every 10 years  Lung Cancer Screening: (Low Dose CT Chest recommended if Age 60-80 years, 30 pack-year currently smoking OR have quit w/in 15years.) does not qualify.   Lung Cancer Screening Referral: n/a  Additional Screening:  Hepatitis C Screening: does not qualify; Completed 10/15/2016  Vision Screening: Recommended annual ophthalmology exams for early detection of glaucoma and other disorders of the eye. Is the patient up to date with their annual eye exam?   no Who is the provider or what is the name of the office in which the patient attends annual eye exams? unknown If pt is not established with a provider, would they like to be referred to a provider to establish care?  Per MD-pt no longer needs exams .   Dental Screening: Recommended annual dental exams for proper oral hygiene  Community Resource Referral / Chronic Care Management: CRR required this visit?  No   CCM required this visit?  No      Plan:     I have personally reviewed  and noted the following in the patient's chart:   Medical and social history Use of alcohol, tobacco or illicit drugs  Current medications and supplements including opioid prescriptions. Patient is not currently taking opioid prescriptions. Functional ability and status Nutritional status Physical activity Advanced directives List of other physicians Hospitalizations, surgeries, and ER visits in previous 12 months Vitals Screenings to include cognitive, depression, and falls Referrals and appointments  In addition, I have reviewed and discussed with patient certain preventive protocols, quality metrics, and best practice recommendations. A written personalized care plan for preventive services as well as general preventive health recommendations were provided to patient.     Joslyn Devon, [place]   02/04/2023   Nurse Notes: face to face  Austin Anderson , Thank you for taking time to come for your Medicare Wellness Visit. I appreciate your ongoing commitment to your health goals. Please review the following plan we discussed and let me know if I can assist you in the future.   These are the goals we discussed:  Goals   None  This is a list of the screening recommended for you and due dates:  Health Maintenance  Topic Date Due   COVID-19 Vaccine (1) Never done   Zoster (Shingles) Vaccine (1 of 2) Never done   Yearly kidney health urinalysis for diabetes  05/08/2022   Yearly kidney function blood test for diabetes  02/03/2023   Lipid (cholesterol) test  02/03/2023   Pneumonia Vaccine (3 of 3 - PPSV23 or PCV20) 02/04/2024*   Flu Shot  04/15/2023   Hemoglobin A1C  05/07/2023   Complete foot exam   02/04/2024   Medicare Annual Wellness Visit  02/04/2024   Colon Cancer Screening  10/12/2024   DTaP/Tdap/Td vaccine (2 - Td or Tdap) 07/22/2027   Hepatitis C Screening  Completed   HPV Vaccine  Aged Out  *Topic was postponed. The date shown is not the original due date.

## 2023-02-04 NOTE — Patient Instructions (Signed)
Austin Anderson  It was nice seeing you in the clinic today.  Here is a summary what we talked about:  1.  Your blood pressure is higher than our goal of 130/80.  I increase your olmesartan from 20 to 40 mg daily.  Please continue the same dose of HCTZ 25 mg daily.  2.  For your chronic abdominal pain, this could be related to irritable bowel syndrome and constipation.  Please continue taking MiraLAX daily to regulate your bowels movement.  I will stop the metformin to see if helps with your symptoms.  Please return in 3 weeks for blood pressure and lab work recheck.  Please see a provider in 3 months   Dr. Cyndie Chime

## 2023-02-04 NOTE — Assessment & Plan Note (Signed)
>>  ASSESSMENT AND PLAN FOR FUNCTIONAL ABDOMINAL PAIN SYNDROME WRITTEN ON 02/04/2023 10:48 AM BY NGUYEN, QUAN, DO  Patient is here for evaluation of his chronic abdominal pain that has been going on for about 10 years.  Pain is localized at the lower quadrant and worse with eating.  He has no pain when he is not eating.  He denies associated nausea, vomiting, diarrhea.  He has chronic constipation that he takes daily MiraLAX to have a daily bowel movement.  He reports occasional straining with bowel movements.  He is unsure of any blood in his stool due to his visual impairment.  He denies any urinary symptoms.  He is currently not taking any PPI medication.  He said that he had recurrent diverticulitis that was treated with penicillin each time.  EGD in 2016 showed chronic inactive gastritis with negative for H. pylori.  Colonoscopy in 2016 showed diverticulosis disease without any polyps.  CTA in 02/2022 was negative for chronic mesenteric ischemia.  Pancreas, gallbladder, liver were unremarkable on the imaging.  His abdominal exam is benign today.  His symptoms is not consistent with dyspepsia.  Low suspicion for gastroparesis with his well-controlled diabetes.  No diarrhea to suspect celiac or IBD.  Mesenteric ischemia has been ruled out.  We will name this chronic functional abdominal pain syndrome likely from IBS.  Patient reports good appetite with no weight loss which is reassuring.  Encourage patient to continue his daily MiraLAX to regulate his bowel movement.  Will trial him off of metformin to see if helps reduce his symptoms.  Follow-up in 3 months.

## 2023-02-04 NOTE — Patient Instructions (Signed)
Health Maintenance, Male Adopting a healthy lifestyle and getting preventive care are important in promoting health and wellness. Ask your health care provider about: The right schedule for you to have regular tests and exams. Things you can do on your own to prevent diseases and keep yourself healthy. What should I know about diet, weight, and exercise? Eat a healthy diet  Eat a diet that includes plenty of vegetables, fruits, low-fat dairy products, and lean protein. Do not eat a lot of foods that are high in solid fats, added sugars, or sodium. Maintain a healthy weight Body mass index (BMI) is a measurement that can be used to identify possible weight problems. It estimates body fat based on height and weight. Your health care provider can help determine your BMI and help you achieve or maintain a healthy weight. Get regular exercise Get regular exercise. This is one of the most important things you can do for your health. Most adults should: Exercise for at least 150 minutes each week. The exercise should increase your heart rate and make you sweat (moderate-intensity exercise). Do strengthening exercises at least twice a week. This is in addition to the moderate-intensity exercise. Spend less time sitting. Even light physical activity can be beneficial. Watch cholesterol and blood lipids Have your blood tested for lipids and cholesterol at 70 years of age, then have this test every 5 years. You may need to have your cholesterol levels checked more often if: Your lipid or cholesterol levels are high. You are older than 70 years of age. You are at high risk for heart disease. What should I know about cancer screening? Many types of cancers can be detected early and may often be prevented. Depending on your health history and family history, you may need to have cancer screening at various ages. This may include screening for: Colorectal cancer. Prostate cancer. Skin cancer. Lung  cancer. What should I know about heart disease, diabetes, and high blood pressure? Blood pressure and heart disease High blood pressure causes heart disease and increases the risk of stroke. This is more likely to develop in people who have high blood pressure readings or are overweight. Talk with your health care provider about your target blood pressure readings. Have your blood pressure checked: Every 3-5 years if you are 18-39 years of age. Every year if you are 40 years old or older. If you are between the ages of 65 and 75 and are a current or former smoker, ask your health care provider if you should have a one-time screening for abdominal aortic aneurysm (AAA). Diabetes Have regular diabetes screenings. This checks your fasting blood sugar level. Have the screening done: Once every three years after age 45 if you are at a normal weight and have a low risk for diabetes. More often and at a younger age if you are overweight or have a high risk for diabetes. What should I know about preventing infection? Hepatitis B If you have a higher risk for hepatitis B, you should be screened for this virus. Talk with your health care provider to find out if you are at risk for hepatitis B infection. Hepatitis C Blood testing is recommended for: Everyone born from 1945 through 1965. Anyone with known risk factors for hepatitis C. Sexually transmitted infections (STIs) You should be screened each year for STIs, including gonorrhea and chlamydia, if: You are sexually active and are younger than 70 years of age. You are older than 70 years of age and your   health care provider tells you that you are at risk for this type of infection. Your sexual activity has changed since you were last screened, and you are at increased risk for chlamydia or gonorrhea. Ask your health care provider if you are at risk. Ask your health care provider about whether you are at high risk for HIV. Your health care provider  may recommend a prescription medicine to help prevent HIV infection. If you choose to take medicine to prevent HIV, you should first get tested for HIV. You should then be tested every 3 months for as long as you are taking the medicine. Follow these instructions at home: Alcohol use Do not drink alcohol if your health care provider tells you not to drink. If you drink alcohol: Limit how much you have to 0-2 drinks a day. Know how much alcohol is in your drink. In the [place] one drink equals one 12 oz bottle of beer (355 mL), one 5 oz glass of wine (148 mL), or one 1 oz glass of hard liquor (44 mL). Lifestyle Do not use any products that contain nicotine or tobacco. These products include cigarettes, chewing tobacco, and vaping devices, such as e-cigarettes. If you need help quitting, ask your health care provider. Do not use street drugs. Do not share needles. Ask your health care provider for help if you need support or information about quitting drugs. General instructions Schedule regular health, dental, and eye exams. Stay current with your vaccines. Tell your health care provider if: You often feel depressed. You have ever been abused or do not feel safe at home. Summary Adopting a healthy lifestyle and getting preventive care are important in promoting health and wellness. Follow your health care provider's instructions about healthy diet, exercising, and getting tested or screened for diseases. Follow your health care provider's instructions on monitoring your cholesterol and blood pressure. This information is not intended to replace advice given to you by your health care provider. Make sure you discuss any questions you have with your health care provider. Document Revised: 01/20/2021 Document Reviewed: 01/20/2021 Elsevier Patient Education  2024 [employer]  

## 2023-02-04 NOTE — Progress Notes (Addendum)
CC: chronic abdominal pain  HPI:  [name] is a 72 y.o. living with hypertension, type 2 diabetes, prostate cancer status post prostatectomy, who presents to clinic for A1c follow-up and chronic abdominal pain.  Please see problem based charting for detail  Past Medical History:  Diagnosis Date   Asthma    Reported by patient's last physician before coming to [facility].  No PFT's on file.    Blind    Diabetes mellitus, type 2 (HCC)    Diverticulosis    Gout    Hypertension    Prostate cancer [facility])    Prostatic adenocarcinoma, Gleason score 7 (3+4), s/p radical prostatectomy 12/04/10 by Dr. Laverle Patter.    Review of Systems:  per HPI  Physical Exam:  Vitals:   02/04/23 0939 02/04/23 0940  BP:  (!) 145/89  Pulse:  67  Weight: 182 lb 6.4 oz (82.7 kg)    Physical Exam Constitutional:      General: He is not in acute distress.    Appearance: He is not ill-appearing.  HENT:     Head: Normocephalic.  Cardiovascular:     Rate and Rhythm: Normal rate and regular rhythm.  Pulmonary:     Effort: Pulmonary effort is normal. No respiratory distress.     Breath sounds: Normal breath sounds.  Abdominal:     General: Bowel sounds are normal. There is no distension.     Palpations: Abdomen is soft. There is no mass.     Tenderness: There is no abdominal tenderness. There is no guarding or rebound.  Musculoskeletal:     Cervical back: Normal range of motion.     Right lower leg: No edema.     Left lower leg: No edema.     Comments: +2 pedis pulses palpated.  No wounds or ulcers.  Skin:    General: Skin is warm.  Neurological:     Mental Status: He is alert. Mental status is at baseline.  Psychiatric:        Mood and Affect: Mood normal.      Assessment & Plan:   See Encounters Tab for problem based charting.  Hypertension His blood pressure is elevated at 145/89.  He did not take his medication this morning but he is not sure because his aide helps with his medication.   Looking back, his blood pressure has been elevated in the 139-140s.  His goal blood pressure < 130/80 due to his diabetes.   -Continue HCTZ 25 mg -Increase olmesartan from 20 to 40 mg -Follow-up in 3 weeks for blood pressure and BMP recheck  Functional abdominal pain syndrome Patient is here for evaluation of his chronic abdominal pain that has been going on for about 10 years.  Pain is localized at the lower quadrant and worse with eating.  He has no pain when he is not eating.  He denies associated nausea, vomiting, diarrhea.  He has chronic constipation that he takes daily MiraLAX to have a daily bowel movement.  He reports occasional straining with bowel movements.  He is unsure of any blood in his stool due to his visual impairment.  He denies any urinary symptoms.  He is currently not taking any PPI medication.  He said that he had recurrent diverticulitis that was treated with penicillin each time.  EGD in 2016 showed chronic inactive gastritis with negative for H. pylori.  Colonoscopy in 2016 showed diverticulosis disease without any polyps.  CTA in 02/2022 was negative for chronic mesenteric  ischemia.  Pancreas, gallbladder, liver were unremarkable on the imaging.  His abdominal exam is benign today.  His symptoms is not consistent with dyspepsia.  Low suspicion for gastroparesis with his well-controlled diabetes.  No diarrhea to suspect celiac or IBD.  Mesenteric ischemia has been ruled out.  We will name this chronic functional abdominal pain syndrome likely from IBS.  Patient reports good appetite with no weight loss which is reassuring.  Encourage patient to continue his daily MiraLAX to regulate his bowel movement.  Will trial him off of metformin to see if helps reduce his symptoms.  Follow-up in 3 months.  Diabetes mellitus, type 2 (HCC) A1c 6.5 today.  -Will try him off of metformin to see if it helps reduce his abdominal symptoms -Foot exam performed today -He no longer needs eye  exam due to his complete loss of vision from glaucoma -Patient unable to provide a urine sample today.  Will obtain microalbumin/creatinine ratio at follow-up  Hyperlipidemia associated with type 2 diabetes mellitus (HCC) Patient is taking atorvastatin for primary prevention.  -Repeat annual lipid panel   Patient discussed with Dr.  Oswaldo Done

## 2023-02-04 NOTE — Assessment & Plan Note (Signed)
A1c 6.5 today.  -Will try him off of metformin to see if it helps reduce his abdominal symptoms -Foot exam performed today -He no longer needs eye exam due to his complete loss of vision from glaucoma -Patient unable to provide a urine sample today.  Will obtain microalbumin/creatinine ratio at follow-up

## 2023-02-04 NOTE — Assessment & Plan Note (Signed)
His blood pressure is elevated at 145/89.  He did not take his medication this morning but he is not sure because his aide helps with his medication.  Looking back, his blood pressure has been elevated in the 139-140s.  His goal blood pressure < 130/80 due to his diabetes.   -Continue HCTZ 25 mg -Increase olmesartan from 20 to 40 mg -Follow-up in 3 weeks for blood pressure and BMP recheck

## 2023-02-04 NOTE — Assessment & Plan Note (Addendum)
Patient is here for evaluation of his chronic abdominal pain that has been going on for about 10 years.  Pain is localized at the lower quadrant and worse with eating.  He has no pain when he is not eating.  He denies associated nausea, vomiting, diarrhea.  He has chronic constipation that he takes daily MiraLAX to have a daily bowel movement.  He reports occasional straining with bowel movements.  He is unsure of any blood in his stool due to his visual impairment.  He denies any urinary symptoms.  He is currently not taking any PPI medication.  He said that he had recurrent diverticulitis that was treated with penicillin each time.  EGD in 2016 showed chronic inactive gastritis with negative for H. pylori.  Colonoscopy in 2016 showed diverticulosis disease without any polyps.  CTA in 02/2022 was negative for chronic mesenteric ischemia.  Pancreas, gallbladder, liver were unremarkable on the imaging.  His abdominal exam is benign today.  His symptoms is not consistent with dyspepsia.  Low suspicion for gastroparesis with his well-controlled diabetes.  No diarrhea to suspect celiac or IBD.  Mesenteric ischemia has been ruled out.  We will name this chronic functional abdominal pain syndrome likely from IBS.  Patient reports good appetite with no weight loss which is reassuring.  Encourage patient to continue his daily MiraLAX to regulate his bowel movement.  Will trial him off of metformin to see if helps reduce his symptoms.  Follow-up in 3 months.

## 2023-02-05 LAB — BMP8+ANION GAP
Anion Gap: 17 mmol/L (ref 10.0–18.0)
BUN/Creatinine Ratio: 11 (ref 10–24)
BUN: 16 mg/dL (ref 8–27)
CO2: 25 mmol/L (ref 20–29)
Calcium: 9.9 mg/dL (ref 8.6–10.2)
Chloride: 98 mmol/L (ref 96–106)
Creatinine, Ser: 1.5 mg/dL — ABNORMAL HIGH (ref 0.76–1.27)
Glucose: 109 mg/dL — ABNORMAL HIGH (ref 70–99)
Potassium: 4.3 mmol/L (ref 3.5–5.2)
Sodium: 140 mmol/L (ref 134–144)
eGFR: 50 mL/min/{1.73_m2} — ABNORMAL LOW

## 2023-02-05 LAB — LIPID PANEL
Chol/HDL Ratio: 2.6 ratio (ref 0.0–5.0)
Cholesterol, Total: 169 mg/dL (ref 100–199)
HDL: 64 mg/dL
LDL Chol Calc (NIH): 96 mg/dL (ref 0–99)
Triglycerides: 42 mg/dL (ref 0–149)
VLDL Cholesterol Cal: 9 mg/dL (ref 5–40)

## 2023-02-05 NOTE — Progress Notes (Signed)
Internal Medicine Clinic Attending  Case and documentation of Dr. Nguyen  reviewed.  I reviewed the AWV findings.  I agree with the assessment, diagnosis, and plan of care documented in the AWV note.     

## 2023-02-05 NOTE — Progress Notes (Signed)
Internal Medicine Clinic Attending  Case discussed with Dr. Nguyen  At the time of the visit.  We reviewed the resident's history and exam and pertinent patient test results.  I agree with the assessment, diagnosis, and plan of care documented in the resident's note. 

## 2023-02-16 ENCOUNTER — Encounter: Payer: Self-pay | Admitting: *Deleted

## 2023-02-25 ENCOUNTER — Ambulatory Visit: Payer: 59

## 2023-03-04 ENCOUNTER — Ambulatory Visit: Payer: 59

## 2023-03-09 ENCOUNTER — Other Ambulatory Visit: Payer: Self-pay | Admitting: Internal Medicine

## 2023-03-09 DIAGNOSIS — M109 Gout, unspecified: Secondary | ICD-10-CM

## 2023-03-11 ENCOUNTER — Ambulatory Visit (INDEPENDENT_AMBULATORY_CARE_PROVIDER_SITE_OTHER): Payer: 59 | Admitting: *Deleted

## 2023-03-11 DIAGNOSIS — I10 Essential (primary) hypertension: Secondary | ICD-10-CM

## 2023-03-11 DIAGNOSIS — E119 Type 2 diabetes mellitus without complications: Secondary | ICD-10-CM | POA: Diagnosis not present

## 2023-03-11 NOTE — Progress Notes (Addendum)
    Austin Anderson presented today for blood pressure check. Patient is prescribed blood pressure medications and I confirmed that patient did take their blood pressure medication prior to today's appointment. Blood pressure was taken in the usual and appropriate manner using an automated BP cuff.     Vitals:   03/11/23 1017  BP: 124/72      Results of today's visit will be routed to Dr. Cyndie Chime and Red Team for review and further management.

## 2023-03-13 LAB — BMP8+ANION GAP
Anion Gap: 17 mmol/L (ref 10.0–18.0)
BUN/Creatinine Ratio: 12 (ref 10–24)
BUN: 15 mg/dL (ref 8–27)
CO2: 21 mmol/L (ref 20–29)
Calcium: 9.6 mg/dL (ref 8.6–10.2)
Chloride: 104 mmol/L (ref 96–106)
Creatinine, Ser: 1.23 mg/dL (ref 0.76–1.27)
Glucose: 100 mg/dL — ABNORMAL HIGH (ref 70–99)
Potassium: 4.6 mmol/L (ref 3.5–5.2)
Sodium: 142 mmol/L (ref 134–144)
eGFR: 64 mL/min/{1.73_m2} (ref >59)

## 2023-06-11 ENCOUNTER — Encounter: Payer: Self-pay | Admitting: Student

## 2023-06-11 ENCOUNTER — Other Ambulatory Visit: Payer: Self-pay

## 2023-06-11 ENCOUNTER — Ambulatory Visit: Payer: 59 | Admitting: Student

## 2023-06-11 VITALS — BP 113/91 | HR 85 | Temp 98.0°F | Ht 66.0 in | Wt 185.5 lb

## 2023-06-11 DIAGNOSIS — E119 Type 2 diabetes mellitus without complications: Secondary | ICD-10-CM

## 2023-06-11 DIAGNOSIS — I10 Essential (primary) hypertension: Secondary | ICD-10-CM | POA: Diagnosis not present

## 2023-06-11 DIAGNOSIS — R109 Unspecified abdominal pain: Secondary | ICD-10-CM | POA: Diagnosis not present

## 2023-06-11 DIAGNOSIS — Z7984 Long term (current) use of oral hypoglycemic drugs: Secondary | ICD-10-CM

## 2023-06-11 DIAGNOSIS — Z23 Encounter for immunization: Secondary | ICD-10-CM | POA: Diagnosis not present

## 2023-06-11 DIAGNOSIS — Z0000 Encounter for general adult medical examination without abnormal findings: Secondary | ICD-10-CM

## 2023-06-11 LAB — POCT GLYCOSYLATED HEMOGLOBIN (HGB A1C): Hemoglobin A1C: 6.4 % — AB (ref 4.0–5.6)

## 2023-06-11 LAB — GLUCOSE, CAPILLARY: Glucose-Capillary: 94 mg/dL (ref 70–99)

## 2023-06-11 NOTE — Progress Notes (Signed)
Subjective:  CC: Abdominal pain, HTN and DM follow-up  HPI:  Austin Anderson is a 72 y.o. male with a past medical history stated below and presents today for Abdominal pain and HTN and DM follow-up. Please see problem based assessment and plan for additional details.  Past Medical History:  Diagnosis Date   Asthma    Reported by patient's last physician before coming to [facility].  No PFT's on file.    Blind    Diabetes mellitus, type 2 (HCC)    Diverticulosis    Gout    Hypertension    Prostate cancer [facility])    Prostatic adenocarcinoma, Gleason score 7 (3+4), s/p radical prostatectomy 12/04/10 by Dr. Laverle Patter.     Current Outpatient Medications on File Prior to Visit  Medication Sig Dispense Refill   albuterol (PROVENTIL HFA) 108 (90 Base) MCG/ACT inhaler Inhale 1-2 puffs into the lungs every 6 (six) hours as needed for wheezing or shortness of breath. 6.7 g 2   allopurinol (ZYLOPRIM) 100 MG tablet TAKE 3 TABLETS BY MOUTH DAILY 300 tablet 2   atorvastatin (LIPITOR) 40 MG tablet TAKE 1 TABLET BY MOUTH DAILY 100 tablet 2   bisacodyl ([facility] GENTLE LAXATIVE) 10 MG suppository PLACE 1 SUPPOSITORY RECTALLY AS NEEDED FOR MODERATE CONSTIPATION OR SEVERE CONSTIPATION. 16 suppository 0   [facility] SENNA 8.6 MG tablet TAKE 1 TABLET (8.6 MG TOTAL) BY MOUTH AT BEDTIME. 120 tablet 0   fluticasone (FLONASE) 50 MCG/ACT nasal spray PLACE 1 SPRAY INTO BOTH NOSTRILS DAILY AS NEEDED FOR ALLERGIES OR RHINITIS. 48 mL 1   hydrochlorothiazide (HYDRODIURIL) 25 MG tablet Take 1 tablet (25 mg total) by mouth daily. 90 tablet 3   olmesartan (BENICAR) 40 MG tablet Take 1 tablet (40 mg total) by mouth daily. 30 tablet 11   No current facility-administered medications on file prior to visit.    Family History  Problem Relation Age of Onset   Heart attack Father 1   Heart disease Mother    Diabetes Sister     Social History   Socioeconomic History   Marital status: Single    Spouse name: Not on file   Number of  children: Not on file   Years of education: Not on file   Highest education level: Not on file  Occupational History   Not on file  Tobacco Use   Smoking status: Former    Types: Cigarettes   Smokeless tobacco: Never   Tobacco comments:    30 years ago  Substance and Sexual Activity   Alcohol use: No    Alcohol/week: 0.0 standard drinks of alcohol   Drug use: No   Sexual activity: Not on file  Other Topics Concern   Not on file  Social History Narrative   The patient lives alone in [place].  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  The patient is on medicaid/medicare, and receives a disability check.  The patient has a sister in [place].  The patient has a 10th grade education.  The patient was diagnosed with Juvenile Glaucoma at age 5, but it was advanced at that time.  The patient lost his vision at age 46, and now can only barely see light, but no colors or shapes.  The patient has an aide who comes to his house every week to help him sort his medications.   Social Determinants of Health   Financial Resource Strain: Medium Risk (02/04/2023)  Overall Financial Resource Strain (CARDIA)    Difficulty of Paying Living Expenses: Somewhat hard  Food Insecurity: No Food Insecurity (05/19/2022)   Hunger Vital Sign    Worried About Running Out of Food in the Last Year: Never true    Ran Out of Food in the Last Year: Never true  Transportation Needs: No Transportation Needs (05/19/2022)   PRAPARE - Administrator, Civil Service (Medical): No    Lack of Transportation (Non-Medical): No  Physical Activity: Inactive (02/04/2023)   Exercise Vital Sign    Days of Exercise per Week: 0 days    Minutes of Exercise per Session: 0 min  Stress: No Stress Concern Present (02/04/2023)   [employer] of Occupational Health - Occupational Stress Questionnaire    Feeling of Stress : Only a little  Social Connections: Socially Isolated  (02/04/2023)   Social Connection and Isolation Panel [NHANES]    Frequency of Communication with Friends and Family: Never    Frequency of Social Gatherings with Friends and Family: Never    Attends Religious Services: Never    Database administrator or Organizations: No    Attends Banker Meetings: Never    Marital Status: Never married  Intimate Partner Violence: Not At Risk (02/04/2023)   Humiliation, Afraid, Rape, and Kick questionnaire    Fear of Current or Ex-Partner: No    Emotionally Abused: No    Physically Abused: No    Sexually Abused: No    Review of Systems: ROS negative except for what is noted on the assessment and plan.  Objective:   Vitals:   06/11/23 1006 06/11/23 1100  BP: 105/72 (!) 113/91  Pulse: 97 85  Temp: 98 F (36.7 C)   TempSrc: Oral   SpO2: 98%   Weight: 185 lb 8 oz (84.1 kg)   Height: 5\' 6"  (1.676 m)     Physical Exam: Constitutional: Well-appearing, visually impaired, not in acute distress. Cardiovascular: regular rate and rhythm, no m/r/g Pulmonary/Chest: normal work of breathing on room air, lungs clear to auscultation bilaterally Abdominal: soft, non-tender, non-distended. Normal bowel sounds, no guarding or rebound. MSK: normal bulk and tone.+2 pedis pulses palpated. No wounds or ulcers Neurological: alert & oriented x 3, 5/5 strength in bilateral upper and lower extremities, normal gait  Assessment & Plan:  Functional abdominal pain syndrome He is reporting a> 3 months history of abdominal pain, worse after eating, without nausea or vomiting or diarrhea. He feels his stomach is more swollen. He has a history of chronic constipation and takes MiraLAX to move the bowels.  The pain is better after bowel movement. On exam today, abdominal exam is benign, other than a ventral bulge when he changes position from the lying down to sitting up.  I have low suspicion for the ventral hernia contributing to this abdominal pain.  I suspect  abdominal pain is multifactorial, likely in the setting of chronic constipation and IBS. -Continue with MiraLAX as needed for constipation.  Hypertension Initial BP 105/72.  Repeat BP 113/91.  -Continue HCTZ 25 mg. -Continue Olmesartan 40 mg -At next visit, if SBP< 110, consider perhaps decreasing olmesartan to 20 mg  Diabetes mellitus, type 2 (HCC) A1c stable at 6.4 today.  He is on metformin 1000 mg.  -Microalbumin/creatinine ratio  - Foot exam performed today. -Continue metformin 1000 mg.  Health care maintenance - Flu shot given.   Patient seen with Dr. Lowry Ram, MD [facility] Internal Medicine  PGY-1 Pager: [phone]  Date 06/11/2023  Time 11:33 AM

## 2023-06-11 NOTE — Assessment & Plan Note (Signed)
Flu shot given

## 2023-06-11 NOTE — Patient Instructions (Signed)
Thank you, [name] for allowing [place] to provide your care today.   Today we discussed :  For your abdominal pain, try to have regular bowel movements.  Continue to take MiraLAX as needed for constipation.     2.  For hypertension continue HCTZ 25 mg and irbesartan 40 mg.   I have ordered the following labs for you:   Lab Orders         Glucose, capillary         Microalbumin / Creatinine Urine Ratio         BMP8+Anion Gap         POC Hbg A1C        Follow up: 3 months    Should you have any questions or concerns please call the internal medicine clinic at [phone].    Laretta Bolster, MD  [facility]

## 2023-06-11 NOTE — Assessment & Plan Note (Signed)
He is reporting a> 3 months history of abdominal pain, worse after eating, without nausea or vomiting or diarrhea. He feels his stomach is more swollen. He has a history of chronic constipation and takes MiraLAX to move the bowels.  The pain is better after bowel movement. On exam today, abdominal exam is benign, other than a ventral bulge when he changes position from the lying down to sitting up.  I have low suspicion for the ventral hernia contributing to this abdominal pain.  I suspect abdominal pain is multifactorial, likely in the setting of chronic constipation and IBS. -Continue with MiraLAX as needed for constipation.

## 2023-06-11 NOTE — Assessment & Plan Note (Signed)
A1c stable at 6.4 today.  He is on metformin 1000 mg.  -Microalbumin/creatinine ratio  - Foot exam performed today. -Continue metformin 1000 mg.

## 2023-06-11 NOTE — Assessment & Plan Note (Signed)
Initial BP 105/72.  Repeat BP 113/91.  -Continue HCTZ 25 mg. -Continue Olmesartan 40 mg -At next visit, if SBP< 110, consider perhaps decreasing olmesartan to 20 mg

## 2023-06-11 NOTE — Assessment & Plan Note (Signed)
>>  ASSESSMENT AND PLAN FOR FUNCTIONAL ABDOMINAL PAIN SYNDROME WRITTEN ON 06/11/2023 11:29 AM BY Laretta Bolster, MD  He is reporting a> 3 months history of abdominal pain, worse after eating, without nausea or vomiting or diarrhea. He feels his stomach is more swollen. He has a history of chronic constipation and takes MiraLAX to move the bowels.  The pain is better after bowel movement. On exam today, abdominal exam is benign, other than a ventral bulge when he changes position from the lying down to sitting up.  I have low suspicion for the ventral hernia contributing to this abdominal pain.  I suspect abdominal pain is multifactorial, likely in the setting of chronic constipation and IBS. -Continue with MiraLAX as needed for constipation.

## 2023-06-12 LAB — BMP8+ANION GAP
Anion Gap: 20 mmol/L — ABNORMAL HIGH (ref 10.0–18.0)
BUN/Creatinine Ratio: 17 (ref 10–24)
BUN: 23 mg/dL (ref 8–27)
CO2: 19 mmol/L — ABNORMAL LOW (ref 20–29)
Calcium: 9.9 mg/dL (ref 8.6–10.2)
Chloride: 103 mmol/L (ref 96–106)
Creatinine, Ser: 1.37 mg/dL — ABNORMAL HIGH (ref 0.76–1.27)
Glucose: 84 mg/dL (ref 70–99)
Potassium: 4.3 mmol/L (ref 3.5–5.2)
Sodium: 142 mmol/L (ref 134–144)
eGFR: 55 mL/min/{1.73_m2} — ABNORMAL LOW (ref >59)

## 2023-06-12 LAB — MICROALBUMIN / CREATININE URINE RATIO
Creatinine, Urine: 284.1 mg/dL
Microalb/Creat Ratio: 4 mg/g{creat} (ref 0–29)
Microalbumin, Urine: 11.2 ug/mL

## 2023-06-23 ENCOUNTER — Telehealth: Payer: Self-pay

## 2023-06-23 NOTE — Telephone Encounter (Signed)
Return pt's call - he stated he has a hx of diverticulitis. But he stated the doctor he saw told him he does not have it. I asked the pt is he taking Miralax as noted in OV note on 9/27; he stated no one told him to take Miralax. Stated he has taken several laxative in the past. And sometime he takes [place] powders for the stomach pain. Reminded pt an appt was schedule by front office for this Friday @ 1045 AM.

## 2023-06-23 NOTE — Telephone Encounter (Signed)
Pt complaining of abdominal pain, and feeling bloating. States it's hurting even when he's eating and resting. Pt states he came to the doctor on 9/27, and mention it to the doctor but nothing was being done. I sch pt an appt for telehealth on 06/25/2023 with Dr. Geraldo Pitter. Pt states he cannot come in person due to transportation. Pt would like to speak with a nurse about his pain and what should he do in the meantime. Please call pt back.

## 2023-06-25 ENCOUNTER — Ambulatory Visit: Payer: 59 | Admitting: Student

## 2023-06-25 DIAGNOSIS — R1013 Epigastric pain: Secondary | ICD-10-CM | POA: Diagnosis not present

## 2023-06-25 MED ORDER — PANTOPRAZOLE SODIUM 40 MG PO TBEC: 40.0000 mg | DELAYED_RELEASE_TABLET | Freq: Every day | ORAL | 1 refills | Status: DC

## 2023-06-25 MED ORDER — DICYCLOMINE HCL 20 MG PO TABS: 20.0000 mg | ORAL_TABLET | Freq: Three times a day (TID) | ORAL | 3 refills | Status: DC

## 2023-06-25 NOTE — Progress Notes (Signed)
  [facility] Internal Medicine Residency Telephone Encounter Continuity Care Appointment  HPI:  This telephone encounter was created for [name] on 06/28/2023 for the following purpose/cc abdominal pain.   Past Medical History:  Past Medical History:  Diagnosis Date   Asthma    Reported by patient's last physician before coming to [facility].  No PFT's on file.    Blind    Diabetes mellitus, type 2 (HCC)    Diverticulosis    Gout    Hypertension    Prostate cancer [facility])    Prostatic adenocarcinoma, Gleason score 7 (3+4), s/p radical prostatectomy 12/04/10 by Dr. Laverle Patter.      ROS:  Please see assessment plan below   Assessment / Plan / Recommendations:  Dyspepsia Presents for telehealth visit today with concerns of continued abdominal pain that is generalized and associated with bloating after eating.  This is not gotten significantly worse since his last visit or changed in any way.  He denies fevers, change in bowel habits (continues to have a bowel movement every 2 days), change in stool color (although he is blind and says he cannot notice a color difference).  His last bowel movement was last night and the pain did not significantly improved after this.  He has documented bouts of recurrent diverticulitis without imaging confirmation that have been treated with antibiotics, chiefly Augmentin with benefit in symptoms.  Last available workup was in 02/2022 with a CTA of the abdomen which did not show any evidence of chronic mesenteric ischemia and showed a normal pancreas, gallbladder, and liver.  Other workup in 2016 with EGD and colonoscopy showed chronic inactive gastritis negative for H. pylori and diverticulosis without any polyps.  He is not currently on a PPI or dicyclomine.  He does take either MiraLAX, Dulcolax, or senna intermittently but does not take any of them the same day.  He is adamant about wanting antibiotics for his current pain.  However without any increased pain,  fevers, or change in symptoms this is not consistent with acute diverticulitis.  Will continue to treat as dyspepsia with postprandial bloating. - Start pantoprazole 40 mg daily and dicyclomine 20 mg 3 times daily with meals and at bedtime - GI referral further workup of chronic dyspepsia    As always, pt is advised that if symptoms worsen or new symptoms arise, they should go to an urgent care facility or to to ER for further evaluation.   Consent and Medical Decision Making:  Patient discussed with Dr. Lafonda Mosses This is a telephone encounter between Chauncy Lean and Rocky Morel on 06/28/2023 for chronic abdominal pain. The visit was conducted with the patient located at home and Rocky Morel at [facility]. The patient's identity was confirmed using their DOB and current address. The patient has consented to being evaluated through a telephone encounter and understands the associated risks (an examination cannot be done and the patient may need to come in for an appointment) / benefits (allows the patient to remain at home, decreasing exposure to coronavirus). I personally spent 30 minutes on medical discussion.

## 2023-06-27 NOTE — Assessment & Plan Note (Signed)
Presents for telehealth visit today with concerns of continued abdominal pain that is generalized and associated with bloating after eating.  This is not gotten significantly worse since his last visit or changed in any way.  He denies fevers, change in bowel habits (continues to have a bowel movement every 2 days), change in stool color (although he is blind and says he cannot notice a color difference).  His last bowel movement was last night and the pain did not significantly improved after this.  He has documented bouts of recurrent diverticulitis without imaging confirmation that have been treated with antibiotics, chiefly Augmentin with benefit in symptoms.  Last available workup was in 02/2022 with a CTA of the abdomen which did not show any evidence of chronic mesenteric ischemia and showed a normal pancreas, gallbladder, and liver.  Other workup in 2016 with EGD and colonoscopy showed chronic inactive gastritis negative for H. pylori and diverticulosis without any polyps.  He is not currently on a PPI or dicyclomine.  He does take either MiraLAX, Dulcolax, or senna intermittently but does not take any of them the same day.  He is adamant about wanting antibiotics for his current pain.  However without any increased pain, fevers, or change in symptoms this is not consistent with acute diverticulitis.  Will continue to treat as dyspepsia with postprandial bloating. - Start pantoprazole 40 mg daily and dicyclomine 20 mg 3 times daily with meals and at bedtime - GI referral further workup of chronic dyspepsia

## 2023-06-28 NOTE — Progress Notes (Signed)
Internal Medicine Clinic Attending  Case discussed with the resident at the time of the visit.  We reviewed the resident's history and exam and pertinent patient test results.  I agree with the assessment, diagnosis, and plan of care documented in the resident's note.     Patient is experiencing dyspepsia and has not had an upper endoscopy in >5 years. I think we should refer to GI to consider EGD. In the meantime, will try Pantoprazole once daily.

## 2023-07-02 NOTE — Progress Notes (Signed)
Internal Medicine Clinic Attending  I was physically present during the key portions of the resident provided service and participated in the medical decision making of patient's management care. I reviewed pertinent patient test results.  The assessment, diagnosis, and plan were formulated together and I agree with the documentation in the resident's note.  Williams, Julie Anne, MD  

## 2023-07-21 ENCOUNTER — Other Ambulatory Visit: Payer: Self-pay

## 2023-07-21 DIAGNOSIS — E119 Type 2 diabetes mellitus without complications: Secondary | ICD-10-CM

## 2023-07-22 DIAGNOSIS — R140 Abdominal distension (gaseous): Secondary | ICD-10-CM | POA: Diagnosis not present

## 2023-07-22 DIAGNOSIS — K5900 Constipation, unspecified: Secondary | ICD-10-CM | POA: Diagnosis not present

## 2023-07-22 DIAGNOSIS — R109 Unspecified abdominal pain: Secondary | ICD-10-CM | POA: Diagnosis not present

## 2023-07-22 MED ORDER — ATORVASTATIN CALCIUM 40 MG PO TABS: 40.0000 mg | ORAL_TABLET | Freq: Every day | ORAL | 2 refills | Status: DC

## 2023-07-23 ENCOUNTER — Other Ambulatory Visit: Payer: Self-pay | Admitting: Student

## 2023-07-23 DIAGNOSIS — R1013 Epigastric pain: Secondary | ICD-10-CM

## 2023-08-26 ENCOUNTER — Ambulatory Visit
Admission: RE | Admit: 2023-08-26 | Discharge: 2023-08-26 | Disposition: A | Discharge status: Home / Self Care | Payer: 59 | Source: Ambulatory Visit | Attending: Nurse Practitioner | Admitting: Nurse Practitioner

## 2023-08-26 ENCOUNTER — Other Ambulatory Visit: Payer: Self-pay | Admitting: Nurse Practitioner

## 2023-08-26 DIAGNOSIS — R1030 Lower abdominal pain, unspecified: Secondary | ICD-10-CM | POA: Diagnosis not present

## 2023-08-26 DIAGNOSIS — K5900 Constipation, unspecified: Secondary | ICD-10-CM | POA: Diagnosis not present

## 2023-08-30 ENCOUNTER — Encounter: Payer: 59 | Admitting: Student

## 2023-09-16 NOTE — Progress Notes (Signed)
 CC: follow up HTN/DMII  HPI:  [name] is a 73 y.o. 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 Last seen by televisit on 06/2023 for worsening dyspepsia. Started on PPI and referred to GI. Last seen in 05/2023 for routine follow up.   Please see problem-based list for further details, assessments, and plans.  Past Medical History:  Diagnosis Date   Asthma    Reported by patient's last physician before coming to [facility].  No PFT's on file.    Blind    Diabetes mellitus, type 2 (HCC)    Diverticulosis    Diverticulosis 03/01/2019   Gout    Hypertension    Impaired mobility and ADLs 01/04/2017   Prostate cancer [facility])    Prostatic adenocarcinoma, Gleason score 7 (3+4), s/p radical prostatectomy 12/04/10 by Dr. Renda.     Current Outpatient Medications (Endocrine & Metabolic):    metFORMIN  (GLUCOPHAGE -XR) 500 MG 24 hr tablet, Take 2 tablets (1,000 mg total) by mouth daily with breakfast.  Current Outpatient Medications (Cardiovascular):    atorvastatin  (LIPITOR) 40 MG tablet, Take 1 tablet (40 mg total) by mouth daily.   olmesartan  (BENICAR ) 40 MG tablet, Take 1 tablet (40 mg total) by mouth daily.  Current Outpatient Medications (Respiratory):    albuterol  (PROVENTIL  HFA) 108 (90 Base) MCG/ACT inhaler, Inhale 1-2 puffs into the lungs every 6 (six) hours as needed for wheezing or shortness of breath.   fluticasone  (FLONASE ) 50 MCG/ACT nasal spray, Place 1 spray into both nostrils daily as needed for allergies or rhinitis.  Current Outpatient Medications (Analgesics):    allopurinol  (ZYLOPRIM ) 100 MG tablet, Take 0.5 tablets (50 mg total) by mouth daily.   Current Outpatient Medications (Other):    dicyclomine  (BENTYL ) 20 MG tablet, Take 1 tablet (20 mg total) by mouth 4 (four) times daily -  before meals and at bedtime.   pantoprazole  (PROTONIX ) 40 MG tablet, Take 1 tablet (40 mg total)  by mouth daily.   senna ([employer] SENNA) 8.6 MG tablet, Take 2 tablets (17.2 mg total) by mouth daily.  Review of Systems:  Review of system negative unless stated in the problem list or HPI.    Physical Exam:  Vitals:   09/17/23 0902  BP: 117/65  Temp: (!) 97.5 F (36.4 C)  TempSrc: Oral  SpO2: 100%  Weight: 188 lb 6.4 oz (85.5 kg)  Height: 5' 6 (1.676 m)   Physical Exam General: NAD HENT: NCAT Lungs: CTAB, no wheeze, rhonchi or rales.  Cardiovascular: regular rhythm with irregular beats, no murmurs appreciated Abdomen: No TTP, slightly distended abdomen, normal bowel sounds MSK: No asymmetry or muscle atrophy.  Skin: no lesions noted on exposed skin Neuro: Alert and oriented x4. CN grossly intact Psych: Normal mood and normal affect   Assessment & Plan:   Hypertension Pt with hx of HTN taking olmesartan  40 mg every day. He is not taking hydrochlorothiazide  and has not taken it for a while now. Last dispense around 1 year ago. Repeat BMP shows stable renal function at [facility].   Diabetes mellitus, type 2 (HCC) Last A1c 6.8 3 months ago. A1c this visit is 6.6. Pt is taking metformin  1000 mg daily.   Dyspepsia Pt with hx of dyspepsia and and abdominal fullness that is mostly post-prandial. Was started on PPI and reports some improvement. He also endorses symptoms of dysphagia. Pt will benefit from EGD and referral was  placed. They attempted to call pt unable to reach pt. Provided patient with telephone number of GI office. Will give this to his NA as well. His symptoms could be secondary to IBS given he has hx and symptoms improve with bowel movement. Advised to adhere to FODMAP diet and use gasx as needed. Will follow up on subsequent visit.   Dissection of left iliac artery [facility]) Discussed with patient the need to follow up with vascular surgery regarding his dissection flap. They wanted to follow up in 09/2023.   Prostate cancer (HCC) Repeat PSA ordered and is  <0.1.  Abnormal heart rhythm Noted on heart auscultation, EKG done and shows sinus rhythm with PVC with burden >10 %. It also shows right bundle branch block. Pt is asymptomatic but will get echo to look for structural heart disease. Will follow up echo.   Gout Will decrease allopurinol  dose to renally dose it at 50 mg daily.   Health care maintenance Changed pt's medication to optimum health for delivery for convenience as pt is blind.   See Encounters Tab for problem based charting.  Patient Discussed with Dr. Forest Morene Bathe, MD Jolynn DEL. [facility] Internal Medicine Residency, PGY-3

## 2023-09-17 ENCOUNTER — Ambulatory Visit (INDEPENDENT_AMBULATORY_CARE_PROVIDER_SITE_OTHER): Payer: 59 | Admitting: Internal Medicine

## 2023-09-17 ENCOUNTER — Ambulatory Visit (HOSPITAL_COMMUNITY)
Admission: RE | Admit: 2023-09-17 | Discharge: 2023-09-17 | Disposition: A | Discharge status: Home / Self Care | Payer: 59 | Source: Ambulatory Visit | Attending: Internal Medicine | Admitting: Internal Medicine

## 2023-09-17 ENCOUNTER — Encounter: Payer: Self-pay | Admitting: Internal Medicine

## 2023-09-17 VITALS — BP 117/65 | Temp 97.5°F | Ht 66.0 in | Wt 188.4 lb

## 2023-09-17 DIAGNOSIS — I499 Cardiac arrhythmia, unspecified: Secondary | ICD-10-CM | POA: Insufficient documentation

## 2023-09-17 DIAGNOSIS — R1013 Epigastric pain: Secondary | ICD-10-CM

## 2023-09-17 DIAGNOSIS — M109 Gout, unspecified: Secondary | ICD-10-CM | POA: Diagnosis not present

## 2023-09-17 DIAGNOSIS — K5900 Constipation, unspecified: Secondary | ICD-10-CM

## 2023-09-17 DIAGNOSIS — E119 Type 2 diabetes mellitus without complications: Secondary | ICD-10-CM

## 2023-09-17 DIAGNOSIS — I10 Essential (primary) hypertension: Secondary | ICD-10-CM | POA: Diagnosis not present

## 2023-09-17 DIAGNOSIS — Z7984 Long term (current) use of oral hypoglycemic drugs: Secondary | ICD-10-CM

## 2023-09-17 DIAGNOSIS — I7772 Dissection of iliac artery: Secondary | ICD-10-CM

## 2023-09-17 DIAGNOSIS — Z0000 Encounter for general adult medical examination without abnormal findings: Secondary | ICD-10-CM

## 2023-09-17 DIAGNOSIS — J45909 Unspecified asthma, uncomplicated: Secondary | ICD-10-CM

## 2023-09-17 DIAGNOSIS — C61 Malignant neoplasm of prostate: Secondary | ICD-10-CM

## 2023-09-17 DIAGNOSIS — R0981 Nasal congestion: Secondary | ICD-10-CM

## 2023-09-17 LAB — GLUCOSE, CAPILLARY: Glucose-Capillary: 112 mg/dL — ABNORMAL HIGH (ref 70–99)

## 2023-09-17 LAB — POCT GLYCOSYLATED HEMOGLOBIN (HGB A1C): Hemoglobin A1C: 6.6 % — AB (ref 4.0–5.6)

## 2023-09-17 MED ORDER — METFORMIN HCL ER 500 MG PO TB24
1000.0000 mg | ORAL_TABLET | Freq: Every day | ORAL | Status: DC
Start: 2023-09-17 — End: 2024-09-16

## 2023-09-17 NOTE — Patient Instructions (Addendum)
 Austin Anderson, it was a pleasure seeing you today! You endorsed feeling well today. Below are some of the things we talked about this visit. We look forward to seeing you in the follow up appointment!  Today we discussed: Continue taking your medications. I will switch your medications to have it delivered to you.   For your heart, I want to get an ultrasound which is called an echocardiogram. We will call you to get this scheduled.   I am attaching a diet plan to help with your abdominal fullness. Try to minimize the foods listed. We can follow up in one month to see how you are doing.   You need to see some other specialist including vascular surgery and stomach doctors.    Call the stomach doctor office, their number is [phone].   I have ordered the following labs today:  Lab Orders         Glucose, capillary         BMP8+Anion Gap         PSA         POC Hbg A1C       Referrals ordered today:   Referral Orders  No referral(s) requested today     I have ordered the following medication/changed the following medications:   Stop the following medications: Medications Discontinued During This Encounter  Medication Reason   hydrochlorothiazide  (HYDRODIURIL ) 25 MG tablet Patient has not taken in last 30 days     Start the following medications: Meds ordered this encounter  Medications   metFORMIN  (GLUCOPHAGE -XR) 500 MG 24 hr tablet    Sig: Take 2 tablets (1,000 mg total) by mouth daily with breakfast.     Follow-up: 4-6 week follow up   Please make sure to arrive 15 minutes prior to your next appointment. If you arrive late, you may be asked to reschedule.   We look forward to seeing you next time. Please call our clinic at [phone] if you have any questions or concerns. The best time to call is Monday-Friday from 9am-4pm, but there is someone available 24/7. If after hours or the weekend, call the main hospital number and ask for the Internal Medicine  Resident On-Call. If you need medication refills, please notify your pharmacy one week in advance and they will send us  a request.  Thank you for letting us  take part in your care. Wishing you the best!  Thank you, Morene Bathe, MD

## 2023-09-18 LAB — PSA: Prostate Specific Ag, Serum: <0.1 ng/mL (ref 0.0–4.0)

## 2023-09-18 LAB — BMP8+ANION GAP
Anion Gap: 14 mmol/L (ref 10.0–18.0)
BUN/Creatinine Ratio: 13 (ref 10–24)
BUN: 20 mg/dL (ref 8–27)
CO2: 24 mmol/L (ref 20–29)
Calcium: 9.6 mg/dL (ref 8.6–10.2)
Chloride: 101 mmol/L (ref 96–106)
Creatinine, Ser: 1.56 mg/dL — ABNORMAL HIGH (ref 0.76–1.27)
Glucose: 107 mg/dL — ABNORMAL HIGH (ref 70–99)
Potassium: 4.5 mmol/L (ref 3.5–5.2)
Sodium: 139 mmol/L (ref 134–144)
eGFR: 47 mL/min/{1.73_m2} — ABNORMAL LOW (ref >59)

## 2023-09-19 DIAGNOSIS — I499 Cardiac arrhythmia, unspecified: Secondary | ICD-10-CM | POA: Insufficient documentation

## 2023-09-19 MED ORDER — ATORVASTATIN CALCIUM 40 MG PO TABS: 40.0000 mg | ORAL_TABLET | Freq: Every day | ORAL | 3 refills | Status: DC

## 2023-09-19 MED ORDER — METFORMIN HCL ER 500 MG PO TB24: 1000.0000 mg | ORAL_TABLET | Freq: Every day | ORAL | 3 refills | Status: DC

## 2023-09-19 MED ORDER — DICYCLOMINE HCL 20 MG PO TABS: 20.0000 mg | ORAL_TABLET | Freq: Three times a day (TID) | ORAL | 11 refills | Status: DC

## 2023-09-19 MED ORDER — SENNOSIDES 8.6 MG PO TABS
2.0000 | ORAL_TABLET | Freq: Every day | ORAL | 0 refills | Status: AC
Start: 2023-09-19 — End: 2023-10-19

## 2023-09-19 MED ORDER — OLMESARTAN MEDOXOMIL 40 MG PO TABS: 40.0000 mg | ORAL_TABLET | Freq: Every day | ORAL | 3 refills | Status: DC

## 2023-09-19 MED ORDER — PANTOPRAZOLE SODIUM 40 MG PO TBEC: 40.0000 mg | DELAYED_RELEASE_TABLET | Freq: Every day | ORAL | 3 refills | Status: DC

## 2023-09-19 MED ORDER — ALBUTEROL SULFATE HFA 108 (90 BASE) MCG/ACT IN AERS: 1.0000 | INHALATION_SPRAY | Freq: Four times a day (QID) | RESPIRATORY_TRACT | 2 refills | Status: DC | PRN

## 2023-09-19 MED ORDER — ALLOPURINOL 100 MG PO TABS: 50.0000 mg | ORAL_TABLET | Freq: Every day | ORAL | 3 refills | Status: DC

## 2023-09-19 MED ORDER — FLUTICASONE PROPIONATE 50 MCG/ACT NA SUSP: 1.0000 | Freq: Every day | NASAL | 1 refills | Status: AC | PRN

## 2023-09-19 NOTE — Assessment & Plan Note (Signed)
 Changed pt's medication to optimum health for delivery for convenience as pt is blind.

## 2023-09-19 NOTE — Assessment & Plan Note (Signed)
 Repeat PSA ordered and is <0.1.

## 2023-09-19 NOTE — Assessment & Plan Note (Signed)
 Discussed with patient the need to follow up with vascular surgery regarding his dissection flap. They wanted to follow up in 09/2023.

## 2023-09-19 NOTE — Assessment & Plan Note (Signed)
 Will decrease allopurinol dose to renally dose it at 50 mg daily.

## 2023-09-19 NOTE — Assessment & Plan Note (Signed)
 Noted on heart auscultation, EKG done and shows sinus rhythm with PVC with burden >10 %. It also shows right bundle branch block. Pt is asymptomatic but will get echo to look for structural heart disease. Will follow up echo.

## 2023-09-19 NOTE — Assessment & Plan Note (Signed)
 Pt with hx of HTN taking olmesartan 40 mg every day. He is not taking hydrochlorothiazide and has not taken it for a while now. Last dispense around 1 year ago. Repeat BMP shows stable renal function at [facility].

## 2023-09-19 NOTE — Assessment & Plan Note (Signed)
 Pt with hx of dyspepsia and and abdominal fullness that is mostly post-prandial. Was started on PPI and reports some improvement. He also endorses symptoms of dysphagia. Pt will benefit from EGD and referral was placed. They attempted to call pt unable to reach pt. Provided patient with telephone number of GI office. Will give this to his NA as well. His symptoms could be secondary to IBS given he has hx and symptoms improve with bowel movement. Advised to adhere to FODMAP diet and use gasx as needed. Will follow up on subsequent visit.

## 2023-09-19 NOTE — Assessment & Plan Note (Signed)
 Last A1c 6.8 3 months ago. A1c this visit is 6.6. Pt is taking metformin 1000 mg daily.

## 2023-09-20 NOTE — Progress Notes (Signed)
 Internal Medicine Clinic Attending  Case discussed with the resident at the time of the visit.  We reviewed the resident's history and exam and pertinent patient test results.  I agree with the assessment, diagnosis, and plan of care documented in the resident's note.

## 2023-09-23 MED ORDER — METFORMIN HCL 1000 MG PO TABS: 1000.0000 mg | ORAL_TABLET | Freq: Two times a day (BID) | ORAL | Status: DC

## 2023-09-23 NOTE — Addendum Note (Signed)
 Addended by: Gwenevere Abbot on: 09/23/2023 04:00 PM   Modules accepted: Orders

## 2023-10-07 ENCOUNTER — Ambulatory Visit (HOSPITAL_COMMUNITY): Payer: 59

## 2023-10-12 ENCOUNTER — Ambulatory Visit (HOSPITAL_COMMUNITY)
Admission: RE | Admit: 2023-10-12 | Discharge: 2023-10-12 | Disposition: A | Discharge status: Home / Self Care | Payer: 59 | Source: Ambulatory Visit | Attending: Internal Medicine | Admitting: Internal Medicine

## 2023-10-12 DIAGNOSIS — I499 Cardiac arrhythmia, unspecified: Secondary | ICD-10-CM

## 2023-10-12 DIAGNOSIS — Z87891 Personal history of nicotine dependence: Secondary | ICD-10-CM | POA: Insufficient documentation

## 2023-10-12 DIAGNOSIS — E119 Type 2 diabetes mellitus without complications: Secondary | ICD-10-CM | POA: Insufficient documentation

## 2023-10-12 DIAGNOSIS — I700 Atherosclerosis of aorta: Secondary | ICD-10-CM | POA: Insufficient documentation

## 2023-10-12 DIAGNOSIS — I10 Essential (primary) hypertension: Secondary | ICD-10-CM | POA: Diagnosis not present

## 2023-10-12 DIAGNOSIS — R9431 Abnormal electrocardiogram [ECG] [EKG]: Secondary | ICD-10-CM | POA: Diagnosis not present

## 2023-10-12 LAB — ECHOCARDIOGRAM COMPLETE
AR max vel: 2.35 cm2
AV Area VTI: 2.98 cm2
AV Area mean vel: 2.46 cm2
AV Mean grad: 3 mm[Hg]
AV Peak grad: 6.5 mm[Hg]
Ao pk vel: 1.27 m/s
Area-P 1/2: 2.99 cm2
MV VTI: 2.14 cm2
S' Lateral: 1.8 cm

## 2023-10-12 MED ORDER — PERFLUTREN LIPID MICROSPHERE
1.0000 mL | INTRAVENOUS | Status: AC | PRN
  Administered 2023-10-12: 3 mL via INTRAVENOUS

## 2023-10-12 NOTE — Progress Notes (Signed)
  Echocardiogram 2D Echocardiogram has been performed.  Austin Anderson 10/12/2023, 8:38 AM

## 2023-10-20 ENCOUNTER — Other Ambulatory Visit: Payer: Self-pay | Admitting: Student

## 2023-10-20 DIAGNOSIS — R1013 Epigastric pain: Secondary | ICD-10-CM

## 2023-10-20 NOTE — Telephone Encounter (Signed)
 Rx last refilled on 09/19/23 with 11 refills. Refill request not appropriate.

## 2023-10-25 ENCOUNTER — Encounter: Payer: Self-pay | Admitting: Internal Medicine

## 2023-10-29 ENCOUNTER — Encounter: Payer: 59 | Admitting: Student

## 2023-11-10 ENCOUNTER — Ambulatory Visit (INDEPENDENT_AMBULATORY_CARE_PROVIDER_SITE_OTHER): Payer: 59 | Admitting: Student

## 2023-11-10 VITALS — BP 130/83 | HR 87 | Temp 97.8°F | Ht 66.0 in | Wt 176.2 lb

## 2023-11-10 DIAGNOSIS — I10 Essential (primary) hypertension: Secondary | ICD-10-CM

## 2023-11-10 DIAGNOSIS — I499 Cardiac arrhythmia, unspecified: Secondary | ICD-10-CM | POA: Diagnosis not present

## 2023-11-10 MED ORDER — METOPROLOL SUCCINATE ER 50 MG PO TB24: 50.0000 mg | ORAL_TABLET | Freq: Every day | ORAL | 3 refills | Status: DC

## 2023-11-10 NOTE — Progress Notes (Unsigned)
 CC: Follow-up  HPI:  [name] is a 73 y.o. male living with a history stated below and presents today for follow-up. Please see problem based assessment and plan for additional details.  Past Medical History:  Diagnosis Date   Asthma    Reported by patient's last physician before coming to [facility].  No PFT's on file.    Blind    Diabetes mellitus, type 2 (HCC)    Diverticulosis    Diverticulosis 03/01/2019   Gout    Hypertension    Impaired mobility and ADLs 01/04/2017   Prostate cancer [facility])    Prostatic adenocarcinoma, Gleason score 7 (3+4), s/p radical prostatectomy 12/04/10 by Dr. Laverle Patter.     Current Outpatient Medications on File Prior to Visit  Medication Sig Dispense Refill   albuterol (PROVENTIL HFA) 108 (90 Base) MCG/ACT inhaler Inhale 1-2 puffs into the lungs every 6 (six) hours as needed for wheezing or shortness of breath. 6.7 g 2   allopurinol (ZYLOPRIM) 100 MG tablet Take 0.5 tablets (50 mg total) by mouth daily. 45 tablet 3   atorvastatin (LIPITOR) 40 MG tablet Take 1 tablet (40 mg total) by mouth daily. 90 tablet 3   dicyclomine (BENTYL) 20 MG tablet Take 1 tablet (20 mg total) by mouth 4 (four) times daily -  before meals and at bedtime. 120 tablet 11   fluticasone (FLONASE) 50 MCG/ACT nasal spray Place 1 spray into both nostrils daily as needed for allergies or rhinitis. 48 mL 1   metFORMIN (GLUCOPHAGE) 1000 MG tablet Take 1 tablet (1,000 mg total) by mouth 2 (two) times daily with a meal.     olmesartan (BENICAR) 40 MG tablet Take 1 tablet (40 mg total) by mouth daily. 90 tablet 3   pantoprazole (PROTONIX) 40 MG tablet Take 1 tablet (40 mg total) by mouth daily. 90 tablet 3   No current facility-administered medications on file prior to visit.    Family History  Problem Relation Age of Onset   Heart attack Father 15   Heart disease Mother    Diabetes Sister     Social History   Socioeconomic History   Marital status: Single    Spouse name: Not on  file   Number of children: Not on file   Years of education: Not on file   Highest education level: Not on file  Occupational History   Not on file  Tobacco Use   Smoking status: Former    Types: Cigarettes   Smokeless tobacco: Never   Tobacco comments:    30 years ago  Substance and Sexual Activity   Alcohol use: No    Alcohol/week: 0.0 standard drinks of alcohol   Drug use: No   Sexual activity: Not on file  Other Topics Concern   Not on file  Social History Narrative   The patient lives alone in [place].  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  The patient is on medicaid/medicare, and receives a disability check.  The patient has a sister in [place].  The patient has a 10th grade education.  The patient was diagnosed with Juvenile Glaucoma at age 67, but it was advanced at that time.  The patient lost his vision at age 67, and now can only barely see light, but no colors or shapes.  The patient has an aide who comes to his house every week to help him sort his medications.   Social Drivers  of Health   Financial Resource Strain: Medium Risk (02/04/2023)   Overall Financial Resource Strain (CARDIA)    Difficulty of Paying Living Expenses: Somewhat hard  Food Insecurity: No Food Insecurity (05/19/2022)   Hunger Vital Sign    Worried About Running Out of Food in the Last Year: Never true    Ran Out of Food in the Last Year: Never true  Transportation Needs: No Transportation Needs (05/19/2022)   PRAPARE - Administrator, Civil Service (Medical): No    Lack of Transportation (Non-Medical): No  Physical Activity: Inactive (02/04/2023)   Exercise Vital Sign    Days of Exercise per Week: 0 days    Minutes of Exercise per Session: 0 min  Stress: No Stress Concern Present (02/04/2023)   [employer] of Occupational Health - Occupational Stress Questionnaire    Feeling of Stress : Only a little  Social Connections: Socially  Isolated (02/04/2023)   Social Connection and Isolation Panel [NHANES]    Frequency of Communication with Friends and Family: Never    Frequency of Social Gatherings with Friends and Family: Never    Attends Religious Services: Never    Database administrator or Organizations: No    Attends Banker Meetings: Never    Marital Status: Never married  Intimate Partner Violence: Not At Risk (02/04/2023)   Humiliation, Afraid, Rape, and Kick questionnaire    Fear of Current or Ex-Partner: No    Emotionally Abused: No    Physically Abused: No    Sexually Abused: No    Review of Systems: ROS negative except for what is noted on the assessment and plan.  Vitals:   11/10/23 0909  BP: 130/83  Pulse: 87  Temp: 97.8 F (36.6 C)  TempSrc: Oral  SpO2: 97%  Weight: 176 lb 3.2 oz (79.9 kg)  Height: 5\' 6"  (1.676 m)    Physical Exam: Constitutional: well-appearing, sitting in chair, in no acute distress Cardiovascular: regular rate and rhythm, no m/r/g Pulmonary/Chest: normal work of breathing on room air, lungs clear to auscultation bilaterally Abdominal: soft, non-tender, non-distended MSK: normal bulk and tone Skin: warm and dry Psych: normal mood and behavior  Assessment & Plan:     Patient {GC/[id number]::"discussed with","seen with"} Dr. {[id number]::"[id number]","G. Hoffman","Mullen","Narendra","Vincent","Guilloud","Lau","Machen"}  No problem-specific Assessment & Plan notes found for this encounter.   Carmina Miller, D.O. [facility] Internal Medicine, PGY-1 Phone: [phone] Date 11/10/2023 Time 10:03 AM  Hypertension Pt with hx of HTN taking olmesartan 40 mg every day. He is not taking hydrochlorothiazide and has not taken it for a while now. Last dispense around 1 year ago. Repeat BMP shows stable renal function at [facility].    Diabetes mellitus, type 2 (HCC) Last A1c 6.8 3 months ago. A1c this visit is 6.6. Pt is taking metformin 1000 mg daily.       Dissection of left iliac artery [facility]) Discussed with patient the need to follow up with vascular surgery regarding his dissection flap. They wanted to follow up in 09/2023.      Abnormal heart rhythm Noted on heart auscultation, EKG done and shows sinus rhythm with PVC with burden >10 %. It also shows right bundle branch block. Pt is asymptomatic but will get echo to look for structural heart disease. Will follow up echo.    Gout Will decrease allopurinol dose to renally dose it at 50 mg daily.

## 2023-11-11 NOTE — Assessment & Plan Note (Signed)
 As noted on 09/2023 visit. EKG done at that times which showed sinus rhythm with > 10% PVC's. It also showed right bundle branch block. Echo ordered which was unremarkable. Today patient's rhythm is irregular and he endorses occasional lightheadedness with standing. Patient is legally blind so difficult to define dizziness vs lightheadedness and presyncope, but he denies syncope.  -Start Toprol-XL 50 -Follow-up in two months

## 2023-11-11 NOTE — Assessment & Plan Note (Addendum)
 Stable. -Continue Olmesartan 40

## 2023-11-14 NOTE — Addendum Note (Signed)
 Addended by: Carlynn Purl C on: 11/14/2023 11:48 AM   Modules accepted: Level of Service

## 2023-11-14 NOTE — Progress Notes (Signed)
 Internal Medicine Clinic Attending  Case discussed with the resident at the time of the visit.  We reviewed the resident's history and exam and pertinent patient test results.  I agree with the assessment, diagnosis, and plan of care documented in the resident's note.

## 2023-12-14 ENCOUNTER — Other Ambulatory Visit: Payer: Self-pay | Admitting: Student

## 2023-12-14 DIAGNOSIS — J45909 Unspecified asthma, uncomplicated: Secondary | ICD-10-CM

## 2023-12-14 DIAGNOSIS — M109 Gout, unspecified: Secondary | ICD-10-CM

## 2023-12-14 NOTE — Telephone Encounter (Signed)
 Hydrochlorothiazide discontinued 09/17/23 Metoprolol last refilled 11/10/23 with 3 refills Albuterol and allopurinol refilled.

## 2023-12-16 ENCOUNTER — Other Ambulatory Visit: Payer: Self-pay | Admitting: Student

## 2023-12-16 NOTE — Telephone Encounter (Signed)
 Copied from CRM [id number]. Topic: Clinical - Prescription Issue >> Dec 16, 2023  8:25 AM Philippa Chester F wrote: Reason for CRM: The patient's new mail in pharmacy [facility] called in the following prescription on the patint behalf.   hydrochlorothiazide (HYDRODIURIL) 25 MG tablet   It does look like the prescription has been discontinued please follow up with patient as he is unaware of the matter per the pharmacy.  Callback number: [phone]

## 2023-12-16 NOTE — Telephone Encounter (Signed)
 Copied from CRM [id number]. Topic: Clinical - Medication Refill >> Dec 16, 2023  8:30 AM Philippa Chester F wrote: Most Recent Primary Care Visit:  Provider: Carmina Miller  Department: [facility]  Visit Type: OPEN ESTABLISHED  Date: 11/10/2023  Medication: metoprolol succinate (TOPROL XL) 50 MG 24 hr tablet  Has the patient contacted their pharmacy? Yes  Is this the correct pharmacy for this prescription? Yes  This is the patient's preferred pharmacy:   [facility] [place] - [address] [place] [address] Phone: [phone] Fax: [phone]   Has the prescription been filled recently? No  Is the patient out of the medication? Yes  Has the patient been seen for an appointment in the last year OR does the patient have an upcoming appointment? Yes  Can we respond through MyChart? No  Agent: Please be advised that Rx refills may take up to 3 business days. We ask that you follow-up with your pharmacy.

## 2023-12-19 ENCOUNTER — Other Ambulatory Visit: Payer: Self-pay | Admitting: Student

## 2023-12-19 DIAGNOSIS — R1013 Epigastric pain: Secondary | ICD-10-CM

## 2024-01-10 ENCOUNTER — Ambulatory Visit: Attending: Internal Medicine

## 2024-01-10 ENCOUNTER — Ambulatory Visit: Payer: 59 | Admitting: Student

## 2024-01-10 ENCOUNTER — Other Ambulatory Visit: Payer: Self-pay | Admitting: Internal Medicine

## 2024-01-10 ENCOUNTER — Encounter: Payer: Self-pay | Admitting: Student

## 2024-01-10 VITALS — BP 127/83 | HR 83 | Temp 97.8°F | Ht 66.0 in | Wt 184.4 lb

## 2024-01-10 DIAGNOSIS — I10 Essential (primary) hypertension: Secondary | ICD-10-CM

## 2024-01-10 DIAGNOSIS — I499 Cardiac arrhythmia, unspecified: Secondary | ICD-10-CM

## 2024-01-10 DIAGNOSIS — I4510 Unspecified right bundle-branch block: Secondary | ICD-10-CM

## 2024-01-10 DIAGNOSIS — E119 Type 2 diabetes mellitus without complications: Secondary | ICD-10-CM

## 2024-01-10 DIAGNOSIS — Z7984 Long term (current) use of oral hypoglycemic drugs: Secondary | ICD-10-CM | POA: Diagnosis not present

## 2024-01-10 DIAGNOSIS — I491 Atrial premature depolarization: Secondary | ICD-10-CM

## 2024-01-10 LAB — POCT GLYCOSYLATED HEMOGLOBIN (HGB A1C): Hemoglobin A1C: 5.8 % — AB (ref 4.0–5.6)

## 2024-01-10 LAB — GLUCOSE, CAPILLARY: Glucose-Capillary: 81 mg/dL (ref 70–99)

## 2024-01-10 NOTE — Assessment & Plan Note (Addendum)
 F/u visit from 2/27 OV which patient was started on metoprolol  50 mg daily after 09/2023 visit with EKG showed sinus but PVC and RBBB. Echo was unremarkable. Patient denies taking metoprolol  at this time, he will check with his home aide. Denies chest pain, SOB or palpitations at this time. Hx of occasional lightheaded but none today, hx of legally blind. Stays hydrated. Caution with metoprolol  so holding it for now. Will do cardiac monitoring for PVC burden and/or arrhythmia, discussed this with patient and he will have his aide assist with setup.   Plan -Ordered home cardiac monitor x 14 days

## 2024-01-10 NOTE — Patient Instructions (Addendum)
 Thank you, [name] for allowing us  to provide your care today. Today we discussed   -We will mail you a heart monitor to wear for 2 weeks. Will call you with results after that is completed. -Not taking the metoprolol  at this time  -Blood work to check diabetes today    -Phone number for GI doctor:  [phone]  Follow up: 3-4 months    Should you have any questions or concerns please call the internal medicine clinic at [phone].    Makayah Pauli, D.O. [facility]

## 2024-01-10 NOTE — Assessment & Plan Note (Addendum)
 BP controlled 127/83 on olmesartan  40 mg. Does not recall taking the new metoprolol  prescribed at last visit. Last BMP in January with stable CKD3a.   Plan -Continue olmesartan 

## 2024-01-10 NOTE — Progress Notes (Unsigned)
 EP to read.

## 2024-01-10 NOTE — Assessment & Plan Note (Signed)
 Status: controlled. T2DM with last A1c of 6.6 in 09/2023.  Patient's A1c today is 5.8.  Currently taking metformin  1000 mg daily.    Plan -Continue metformin   -A1c changed to every 6 months -Ophthalmology referral: n/a given legally blind, shared decision making with patient

## 2024-01-10 NOTE — Progress Notes (Signed)
 CC: 2 month f/u  HPI:  Austin Anderson is a 73 y.o. male living with a history stated below and presents today for 2 month f/u. Please see problem based assessment and plan for additional details.  Past Medical History:  Diagnosis Date   Asthma    Reported by patient's last physician before coming to [facility].  No PFT's on file.    Blind    Diabetes mellitus, type 2 (HCC)    Diverticulosis    Diverticulosis 03/01/2019   Gout    Hypertension    Impaired mobility and ADLs 01/04/2017   Prostate cancer [facility])    Prostatic adenocarcinoma, Gleason score 7 (3+4), s/p radical prostatectomy 12/04/10 by Dr. Rozanne Corners.     Current Outpatient Medications on File Prior to Visit  Medication Sig Dispense Refill   albuterol  (VENTOLIN  HFA) 108 (90 Base) MCG/ACT inhaler INHALE 2-3 PUFFS BY MOUTH AS NEEDED 1 each 11   allopurinol  (ZYLOPRIM ) 100 MG tablet TAKE 1 TAB BY MOUTH THREE TIMES DAILY 90 tablet 11   atorvastatin  (LIPITOR) 40 MG tablet Take 1 tablet (40 mg total) by mouth daily. 90 tablet 3   dicyclomine  (BENTYL ) 20 MG tablet Take 1 tablet (20 mg total) by mouth 4 (four) times daily -  before meals and at bedtime. 120 tablet 11   fluticasone  (FLONASE ) 50 MCG/ACT nasal spray Place 1 spray into both nostrils daily as needed for allergies or rhinitis. 48 mL 1   metFORMIN  (GLUCOPHAGE ) 1000 MG tablet Take 1 tablet (1,000 mg total) by mouth 2 (two) times daily with a meal.     olmesartan  (BENICAR ) 40 MG tablet Take 1 tablet (40 mg total) by mouth daily. 90 tablet 3   pantoprazole  (PROTONIX ) 40 MG tablet TAKE 1 TABLET BY MOUTH EVERY DAY 90 tablet 1   No current facility-administered medications on file prior to visit.    Family History  Problem Relation Age of Onset   Heart attack Father 48   Heart disease Mother    Diabetes Sister     Social History   Socioeconomic History   Marital status: Single    Spouse name: Not on file   Number of children: Not on file   Years of education: Not on file    Highest education level: Not on file  Occupational History   Not on file  Tobacco Use   Smoking status: Former    Types: Cigarettes   Smokeless tobacco: Never   Tobacco comments:    30 years ago  Substance and Sexual Activity   Alcohol use: No    Alcohol/week: 0.0 standard drinks of alcohol   Drug use: No   Sexual activity: Not on file  Other Topics Concern   Not on file  Social History Narrative   The patient lives alone in [place].  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  The patient is on medicaid/medicare, and receives a disability check.  The patient has a sister in [place].  The patient has a 10th grade education.  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  The patient lost his vision at age 5, and now can only barely see light, but no colors or shapes.  The patient has an aide who comes to his house every week to help him sort his medications.   Social Drivers of Health   Financial Resource Strain: Medium Risk (02/04/2023)  Overall Financial Resource Strain (CARDIA)    Difficulty of Paying Living Expenses: Somewhat hard  Food Insecurity: No Food Insecurity (05/19/2022)   Hunger Vital Sign    Worried About Running Out of Food in the Last Year: Never true    Ran Out of Food in the Last Year: Never true  Transportation Needs: No Transportation Needs (05/19/2022)   PRAPARE - Administrator, Civil Service (Medical): No    Lack of Transportation (Non-Medical): No  Physical Activity: Inactive (02/04/2023)   Exercise Vital Sign    Days of Exercise per Week: 0 days    Minutes of Exercise per Session: 0 min  Stress: No Stress Concern Present (02/04/2023)   [employer] of Occupational Health - Occupational Stress Questionnaire    Feeling of Stress : Only a little  Social Connections: Socially Isolated (02/04/2023)   Social Connection and Isolation Panel [NHANES]     Frequency of Communication with Friends and Family: Never    Frequency of Social Gatherings with Friends and Family: Never    Attends Religious Services: Never    Database administrator or Organizations: No    Attends Banker Meetings: Never    Marital Status: Never married  Intimate Partner Violence: Not At Risk (02/04/2023)   Humiliation, Afraid, Rape, and Kick questionnaire    Fear of Current or Ex-Partner: No    Emotionally Abused: No    Physically Abused: No    Sexually Abused: No    Review of Systems: ROS negative except for what is noted on the assessment and plan.  Vitals:   01/10/24 0854  BP: 127/83  Pulse: 83  Temp: 97.8 F (36.6 C)  TempSrc: Oral  SpO2: 100%  Weight: 184 lb 6.4 oz (83.6 kg)  Height: 5\' 6"  (1.676 m)   Physical Exam: Constitutional: alert, sitting in chair, in no acute distress Cardiovascular: irregular rhythm, normal rate Pulmonary/Chest: normal work of breathing on room air Neurological: alert & oriented x 3  Assessment & Plan:   Hypertension BP controlled 127/83 on olmesartan  40 mg. Does not recall taking the new metoprolol  prescribed at last visit. Last BMP in January with stable CKD3a.   Plan -Continue olmesartan    Abnormal heart rhythm F/u visit from 2/27 OV which patient was started on metoprolol  50 mg daily after 09/2023 visit with EKG showed sinus but PVC and RBBB. Echo was unremarkable. Patient denies taking metoprolol  at this time, he will check with his home aide. Denies chest pain, SOB or palpitations at this time. Hx of occasional lightheaded but none today, hx of legally blind. Stays hydrated. Caution with metoprolol  so holding it for now. Will do cardiac monitoring for PVC burden and/or arrhythmia, discussed this with patient and he will have his aide assist with setup.   Plan -Ordered home cardiac monitor x 14 days   Diabetes mellitus, type 2 (HCC) Status: controlled. T2DM with last A1c of 6.6 in 09/2023.  Patient's  A1c today is 5.8.  Currently taking metformin  1000 mg daily.    Plan -Continue metformin   -A1c changed to every 6 months -Ophthalmology referral: n/a given legally blind, shared decision making with patient    Patient discussed with Dr. Barbara Levins, D.O. [facility] Internal Medicine, PGY-2 Phone: [phone] Date 01/10/2024 Time 12:16 PM

## 2024-01-12 NOTE — Progress Notes (Signed)
 Internal Medicine Clinic Attending  Case discussed with the resident at the time of the visit.  We reviewed the resident's history and exam and pertinent patient test results.  I agree with the assessment, diagnosis, and plan of care documented in the resident's note.

## 2024-01-19 ENCOUNTER — Other Ambulatory Visit: Payer: Self-pay

## 2024-01-19 DIAGNOSIS — E119 Type 2 diabetes mellitus without complications: Secondary | ICD-10-CM

## 2024-01-20 MED ORDER — METFORMIN HCL 1000 MG PO TABS: 1000.0000 mg | ORAL_TABLET | Freq: Two times a day (BID) | ORAL | Status: DC

## 2024-03-15 ENCOUNTER — Ambulatory Visit

## 2024-03-15 VITALS — Ht 66.0 in | Wt 180.0 lb

## 2024-03-15 DIAGNOSIS — Z0000 Encounter for general adult medical examination without abnormal findings: Secondary | ICD-10-CM

## 2024-03-15 NOTE — Progress Notes (Signed)
 Because this visit was a virtual/telehealth visit,  certain criteria was not obtained, such a blood pressure, CBG if applicable, and timed get up and go. Any medications not marked as taking were not mentioned during the medication reconciliation part of the visit. Any vitals not documented were not able to be obtained due to this being a telehealth visit or patient was unable to self-report a recent blood pressure reading due to a lack of equipment at home via telehealth. Vitals that have been documented are verbally provided by the patient.   Subjective:   Austin Anderson is a 73 y.o. who presents for a Medicare Wellness preventive visit.  As a reminder, Annual Wellness Visits don't include a physical exam, and some assessments may be limited, especially if this visit is performed virtually. We may recommend an in-person follow-up visit with your provider if needed.  Visit Complete: Virtual I connected with  Austin Anderson on 03/15/24 by a audio enabled telemedicine application and verified that I am speaking with the correct person using two identifiers.  Patient Location: Home  Provider Location: Office/Clinic  I discussed the limitations of evaluation and management by telemedicine. The patient expressed understanding and agreed to proceed.  Vital Signs: Because this visit was a virtual/telehealth visit, some criteria may be missing or patient reported. Any vitals not documented were not able to be obtained and vitals that have been documented are patient reported.  VideoDeclined- This patient declined Librarian, academic. Therefore the visit was completed with audio only.  Persons Participating in Visit: Patient.  AWV Questionnaire: No: Patient Medicare AWV questionnaire was not completed prior to this visit.  Cardiac Risk Factors include: advanced age (>17men, >18 women);diabetes mellitus;dyslipidemia;family history of premature cardiovascular  disease;hypertension;male gender;sedentary lifestyle     Objective:    Today's Vitals   03/15/24 0955  Weight: 180 lb (81.6 kg)  Height: 5' 6 (1.676 m)  PainSc: 0-No pain   Body mass index is 29.05 kg/m.     03/15/2024    9:57 AM 06/11/2023   10:13 AM 02/04/2023    1:58 PM 02/04/2023    9:42 AM 05/13/2022    1:28 PM 03/16/2022    1:27 PM 02/02/2022    1:30 PM  Advanced Directives  Does Patient Have a Medical Advance Directive? No No No No No No No  Would patient like information on creating a medical advance directive? No - Patient declined No - Patient declined No - Patient declined No - Patient declined No - Patient declined No - Patient declined No - Patient declined    Current Medications (verified) Outpatient Encounter Medications as of 03/15/2024  Medication Sig   albuterol  (VENTOLIN  HFA) 108 (90 Base) MCG/ACT inhaler INHALE 2-3 PUFFS BY MOUTH AS NEEDED   allopurinol  (ZYLOPRIM ) 100 MG tablet TAKE 1 TAB BY MOUTH THREE TIMES DAILY   atorvastatin  (LIPITOR) 40 MG tablet Take 1 tablet (40 mg total) by mouth daily.   dicyclomine  (BENTYL ) 20 MG tablet Take 1 tablet (20 mg total) by mouth 4 (four) times daily -  before meals and at bedtime.   fluticasone  (FLONASE ) 50 MCG/ACT nasal spray Place 1 spray into both nostrils daily as needed for allergies or rhinitis.   metFORMIN  (GLUCOPHAGE ) 1000 MG tablet Take 1 tablet (1,000 mg total) by mouth 2 (two) times daily with a meal.   olmesartan  (BENICAR ) 40 MG tablet Take 1 tablet (40 mg total) by mouth daily.   pantoprazole  (PROTONIX ) 40 MG tablet TAKE  1 TABLET BY MOUTH EVERY DAY   No facility-administered encounter medications on file as of 03/15/2024.    Allergies (verified) Patient has no known allergies.   History: Past Medical History:  Diagnosis Date   Asthma    Reported by patient's last physician before coming to [facility].  No PFT's on file.    Blind    Diabetes mellitus, type 2 (HCC)    Diverticulosis    Diverticulosis 03/01/2019    Gout    Hypertension    Impaired mobility and ADLs 01/04/2017   Prostate cancer [facility])    Prostatic adenocarcinoma, Gleason score 7 (3+4), s/p radical prostatectomy 12/04/10 by Dr. Renda.    History reviewed. No pertinent surgical history. Family History  Problem Relation Age of Onset   Heart attack Father 62   Heart disease Mother    Diabetes Sister    Social History   Socioeconomic History   Marital status: Single    Spouse name: Not on file   Number of children: Not on file   Years of education: Not on file   Highest education level: Not on file  Occupational History   Not on file  Tobacco Use   Smoking status: Former    Types: Cigarettes   Smokeless tobacco: Never   Tobacco comments:    30 years ago  Substance and Sexual Activity   Alcohol use: No    Alcohol/week: 0.0 standard drinks of alcohol   Drug use: No   Sexual activity: Not on file  Other Topics Concern   Not on file  Social History Narrative   The patient lives alone in [place].  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  The patient is on medicaid/medicare, and receives a disability check.  The patient has a sister in [place].  The patient has a 10th grade education.  The patient was diagnosed with Juvenile Glaucoma at age 65, but it was advanced at that time.  The patient lost his vision at age 18, and now can only barely see light, but no colors or shapes.  The patient has an aide who comes to his house every week to help him sort his medications.   Social Drivers of Health   Financial Resource Strain: Medium Risk (03/15/2024)   Overall Financial Resource Strain (CARDIA)    Difficulty of Paying Living Expenses: Somewhat hard  Food Insecurity: No Food Insecurity (03/15/2024)   Hunger Vital Sign    Worried About Running Out of Food in the Last Year: Never true    Ran Out of Food in the Last Year: Never true  Transportation Needs: No Transportation Needs (03/15/2024)    PRAPARE - Administrator, Civil Service (Medical): No    Lack of Transportation (Non-Medical): No  Physical Activity: Inactive (03/15/2024)   Exercise Vital Sign    Days of Exercise per Week: 0 days    Minutes of Exercise per Session: 0 min  Stress: No Stress Concern Present (03/15/2024)   [employer] of Occupational Health - Occupational Stress Questionnaire    Feeling of Stress: Not at all  Social Connections: Socially Isolated (03/15/2024)   Social Connection and Isolation Panel    Frequency of Communication with Friends and Family: Never    Frequency of Social Gatherings with Friends and Family: Never    Attends Religious Services: Never    Database administrator or Organizations: No    Attends Banker  Meetings: Never    Marital Status: Never married    Tobacco Counseling Counseling given: Not Answered Tobacco comments: 30 years ago    Clinical Intake:  Pre-visit preparation completed: Yes  Pain : No/denies pain Pain Score: 0-No pain     BMI - recorded: 29.05 Nutritional Status: BMI 25 -29 Overweight Nutritional Risks: None Diabetes: Yes CBG done?: No Did pt. bring in CBG monitor from home?: No  Lab Results  Component Value Date   HGBA1C 5.8 (A) 01/10/2024   HGBA1C 6.6 (A) 09/17/2023   HGBA1C 6.4 (A) 06/11/2023     How often do you need to have someone help you when you read instructions, pamphlets, or other written materials from your doctor or pharmacy?: 1 - Never What is the last grade level you completed in school?: 10TH  Interpreter Needed?: No  Information entered by :: Trysta Showman N. Tiesha Marich, LPN.   Activities of Daily Living     03/15/2024   10:00 AM 06/11/2023   10:12 AM  In your present state of health, do you have any difficulty performing the following activities:  Hearing? 0 1  Vision? 1 1  Comment  pt is blind  Difficulty concentrating or making decisions? 0 1  Walking or climbing stairs? 1 1  Dressing or  bathing? 1 1  Doing errands, shopping? 1 1  Preparing Food and eating ? Y   Using the Toilet? Y   In the past six months, have you accidently leaked urine? Y   Do you have problems with loss of bowel control? Y   Managing your Medications? Y   Managing your Finances? N   Housekeeping or managing your Housekeeping? Y     Patient Care Team: Marylu Gee, DO as PCP - General  I have updated your Care Teams any recent Medical Services you may have received from other providers in the past year.     Assessment:   This is a routine wellness examination for Austin Anderson.  Hearing/Vision screen Hearing Screening - Comments:: Denies hearing difficulties.  Vision Screening - Comments:: Patient is legally blind.    Goals Addressed             This Visit's Progress    03/15/2024: To keep going and do the best that I can.         Depression Screen     03/15/2024    9:58 AM 11/10/2023    9:10 AM 06/11/2023   10:12 AM 02/04/2023    2:24 PM 02/04/2023    9:54 AM 02/02/2022    4:19 PM 11/05/2021   10:48 AM  PHQ 2/9 Scores  PHQ - 2 Score 1 0 1 1 1  0 1  PHQ- 9 Score 1 0  7 7      Fall Risk     03/15/2024    9:58 AM 11/10/2023    9:10 AM 06/11/2023   10:11 AM 02/04/2023    2:23 PM 08/26/2022    2:23 PM  Fall Risk   Falls in the past year? 1 0 1 0   Number falls in past yr: 1 0 1 0 0  Injury with Fall? 0 0 1 0 0  Risk for fall due to : History of fall(s);Impaired vision No Fall Risks Impaired vision Impaired vision;Other (Comment) Impaired balance/gait  Risk for fall due to: Comment    off balanced denies actual fall   Follow up Falls evaluation completed;Education provided Falls evaluation completed;Falls prevention discussed Falls prevention discussed;Falls evaluation  completed Falls evaluation completed Falls evaluation completed;Education provided;Falls prevention discussed      Data saved with a previous flowsheet row definition    MEDICARE RISK AT HOME:  Medicare Risk at Home Any  stairs in or around the home?: No If so, are there any without handrails?: No Home free of loose throw rugs in walkways, pet beds, electrical cords, etc?: Yes Adequate lighting in your home to reduce risk of falls?: Yes Life alert?: No Use of a cane, walker or w/c?: Yes Grab bars in the bathroom?: Yes Shower chair or bench in shower?: No Elevated toilet seat or a handicapped toilet?: No  TIMED UP AND GO:  Was the test performed?  No  Cognitive Function: 6CIT completed    03/15/2024    9:59 AM  MMSE - Mini Mental State Exam  Not completed: Unable to complete        03/15/2024   10:01 AM 02/04/2023    4:17 PM  6CIT Screen  What Year? 0 points 0 points  What month? 0 points 0 points  What time? 0 points 0 points  Count back from 20 0 points 0 points  Months in reverse 0 points 4 points  Repeat phrase 0 points 2 points  Total Score 0 points 6 points    Immunizations Immunization History  Administered Date(s) Administered   Fluad Quad(high Dose 65+) 07/16/2021   Fluad Trivalent(High Dose 65+) 06/11/2023   Influenza Split 11/30/2012   Influenza,inj,Quad PF,6+ Mos 08/16/2013, 08/01/2014, 08/28/2015, 07/08/2016, 07/21/2017, 08/17/2018, 07/26/2019   PFIZER(Purple Top)SARS-COV-2 Vaccination 11/23/2019, 12/13/2019, 09/26/2020   Pneumococcal Conjugate-13 03/01/2019   Pneumococcal Polysaccharide-23 10/15/2016   Tdap 07/21/2017    Screening Tests Health Maintenance  Topic Date Due   Zoster Vaccines- Shingrix (1 of 2) Never done   Pneumococcal Vaccine: 50+ Years (3 of 3 - PCV20 or PCV21) 10/15/2021   COVID-19 Vaccine (4 - 2024-25 season) 05/16/2023   LIPID PANEL  02/04/2024   OPHTHALMOLOGY EXAM  05/01/2024 (Originally 03/25/1961)   INFLUENZA VACCINE  04/14/2024   Diabetic kidney evaluation - Urine ACR  06/10/2024   FOOT EXAM  06/10/2024   HEMOGLOBIN A1C  07/11/2024   Diabetic kidney evaluation - eGFR measurement  09/16/2024   Colonoscopy  10/12/2024   Medicare Annual  Wellness (AWV)  03/15/2025   DTaP/Tdap/Td (2 - Td or Tdap) 07/22/2027   Hepatitis C Screening  Completed   Hepatitis B Vaccines  Aged Out   HPV VACCINES  Aged Out   Meningococcal B Vaccine  Aged Out    Health Maintenance  Health Maintenance Due  Topic Date Due   Zoster Vaccines- Shingrix (1 of 2) Never done   Pneumococcal Vaccine: 50+ Years (3 of 3 - PCV20 or PCV21) 10/15/2021   COVID-19 Vaccine (4 - 2024-25 season) 05/16/2023   LIPID PANEL  02/04/2024   Health Maintenance Items Addressed: Patient aware of current care gaps.  Immunization record was verified by [facility] and updated in patient's chart.  Additional Screening:  Vision Screening: Recommended annual ophthalmology exams for early detection of glaucoma and other disorders of the eye. Would you like a referral to an eye doctor? No    Dental Screening: Recommended annual dental exams for proper oral hygiene  Community Resource Referral / Chronic Care Management: CRR required this visit?  No   CCM required this visit?  No   Plan:    I have personally reviewed and noted the following in the patient's chart:   Medical and social history Use  of alcohol, tobacco or illicit drugs  Current medications and supplements including opioid prescriptions. Patient is not currently taking opioid prescriptions. Functional ability and status Nutritional status Physical activity Advanced directives List of other physicians Hospitalizations, surgeries, and ER visits in previous 12 months Vitals Screenings to include cognitive, depression, and falls Referrals and appointments  In addition, I have reviewed and discussed with patient certain preventive protocols, quality metrics, and best practice recommendations. A written personalized care plan for preventive services as well as general preventive health recommendations were provided to patient.   Roz LOISE Fuller, LPN   10/21/7972   After Visit Summary: (Declined) Due to this  being a telephonic visit, with patients personalized plan was offered to patient but patient Declined AVS at this time   Notes: Patient aware of current care gaps.  Immunization record was verified by [facility] and updated in patient's chart.

## 2024-03-15 NOTE — Patient Instructions (Signed)
 Austin Anderson , Thank you for taking time out of your busy schedule to complete your Annual Wellness Visit with me. I enjoyed our conversation and look forward to speaking with you again next year. I, as well as your care team,  appreciate your ongoing commitment to your health goals. Please review the following plan we discussed and let me know if I can assist you in the future. Your Game plan/ To Do List    Referrals: If you haven't heard from the office you've been referred to, please reach out to them at the phone provided.   Follow up Visits: Next Medicare AWV with our clinical staff: 03/21/2025 at 9:50 a.m. phone visit with Nurse Health Advisor   Have you seen your provider in the last 6 months (3 months if uncontrolled diabetes)? Yes Next Office Visit with your provider: 04/14/2024 at 9:15 a.m. with Dr. Marylu  Clinician Recommendations:  Aim for 30 minutes of exercise or brisk walking, 6-8 glasses of water, and 5 servings of fruits and vegetables each day.       This is a list of the screening recommended for you and due dates:  Health Maintenance  Topic Date Due   Zoster (Shingles) Vaccine (1 of 2) Never done   Pneumococcal Vaccine for age over 34 (3 of 3 - PCV20 or PCV21) 10/15/2021   COVID-19 Vaccine (4 - 2024-25 season) 05/16/2023   Lipid (cholesterol) test  02/04/2024   Eye exam for diabetics  05/01/2024*   Flu Shot  04/14/2024   Yearly kidney health urinalysis for diabetes  06/10/2024   Complete foot exam   06/10/2024   Hemoglobin A1C  07/11/2024   Yearly kidney function blood test for diabetes  09/16/2024   Colon Cancer Screening  10/12/2024   Medicare Annual Wellness Visit  03/15/2025   DTaP/Tdap/Td vaccine (2 - Td or Tdap) 07/22/2027   Hepatitis C Screening  Completed   Hepatitis B Vaccine  Aged Out   HPV Vaccine  Aged Out   Meningitis B Vaccine  Aged Out  *Topic was postponed. The date shown is not the original due date.    Advanced directives: (Declined) Advance  directive discussed with you today. Even though you declined this today, please call our office should you change your mind, and we can give you the proper paperwork for you to fill out. Advance Care Planning is important because it:  [x]  Makes sure you receive the medical care that is consistent with your values, goals, and preferences  [x]  It provides guidance to your family and loved ones and reduces their decisional burden about whether or not they are making the right decisions based on your wishes.  Follow the link provided in your after visit summary or read over the paperwork we have mailed to you to help you started getting your Advance Directives in place. If you need assistance in completing these, please reach out to us  so that we can help you!  See attachments for Preventive Care and Fall Prevention Tips.

## 2024-03-21 ENCOUNTER — Other Ambulatory Visit: Payer: Self-pay

## 2024-03-21 DIAGNOSIS — E119 Type 2 diabetes mellitus without complications: Secondary | ICD-10-CM

## 2024-03-21 NOTE — Telephone Encounter (Signed)
 Medication last sent 01/20/24 as no print instead of normal.

## 2024-03-24 MED ORDER — METFORMIN HCL 1000 MG PO TABS
1000.0000 mg | ORAL_TABLET | Freq: Two times a day (BID) | ORAL | 3 refills | Status: DC
Start: 2024-03-24 — End: 2024-07-17

## 2024-04-06 NOTE — Progress Notes (Signed)
 Internal Medicine Attending:  I reviewed the AWV findings of the medical professional who conducted the visit.

## 2024-04-14 ENCOUNTER — Encounter: Admitting: Student

## 2024-04-19 ENCOUNTER — Ambulatory Visit: Admitting: Student

## 2024-04-19 VITALS — BP 123/86 | HR 92 | Temp 98.2°F | Ht 66.0 in | Wt 180.6 lb

## 2024-04-19 DIAGNOSIS — Z23 Encounter for immunization: Secondary | ICD-10-CM

## 2024-04-19 DIAGNOSIS — Z7984 Long term (current) use of oral hypoglycemic drugs: Secondary | ICD-10-CM

## 2024-04-19 DIAGNOSIS — E119 Type 2 diabetes mellitus without complications: Secondary | ICD-10-CM

## 2024-04-19 DIAGNOSIS — E1122 Type 2 diabetes mellitus with diabetic chronic kidney disease: Secondary | ICD-10-CM

## 2024-04-19 DIAGNOSIS — I7772 Dissection of iliac artery: Secondary | ICD-10-CM

## 2024-04-19 DIAGNOSIS — I10 Essential (primary) hypertension: Secondary | ICD-10-CM

## 2024-04-19 DIAGNOSIS — K5904 Chronic idiopathic constipation: Secondary | ICD-10-CM

## 2024-04-19 DIAGNOSIS — N183 Chronic kidney disease, stage 3 unspecified: Secondary | ICD-10-CM | POA: Diagnosis not present

## 2024-04-19 DIAGNOSIS — E1169 Type 2 diabetes mellitus with other specified complication: Secondary | ICD-10-CM

## 2024-04-19 DIAGNOSIS — K5900 Constipation, unspecified: Secondary | ICD-10-CM | POA: Diagnosis not present

## 2024-04-19 DIAGNOSIS — J45909 Unspecified asthma, uncomplicated: Secondary | ICD-10-CM

## 2024-04-19 DIAGNOSIS — E785 Hyperlipidemia, unspecified: Secondary | ICD-10-CM | POA: Diagnosis not present

## 2024-04-19 DIAGNOSIS — Z0000 Encounter for general adult medical examination without abnormal findings: Secondary | ICD-10-CM

## 2024-04-19 DIAGNOSIS — I129 Hypertensive chronic kidney disease with stage 1 through stage 4 chronic kidney disease, or unspecified chronic kidney disease: Secondary | ICD-10-CM

## 2024-04-19 LAB — POCT GLYCOSYLATED HEMOGLOBIN (HGB A1C): Hemoglobin A1C: 6.6 % — AB (ref 4.0–5.6)

## 2024-04-19 LAB — GLUCOSE, CAPILLARY: Glucose-Capillary: 86 mg/dL (ref 70–99)

## 2024-04-19 MED ORDER — ALBUTEROL SULFATE HFA 108 (90 BASE) MCG/ACT IN AERS: 2.0000 | INHALATION_SPRAY | RESPIRATORY_TRACT | 11 refills | Status: DC | PRN

## 2024-04-19 NOTE — Assessment & Plan Note (Signed)
 Well-controlled with A1c of 6.6. Managed with Metformin  1,000 BID. Check BMP.

## 2024-04-19 NOTE — Assessment & Plan Note (Signed)
 Check Lipid panel today. Managed with Lipitor 40 mg daily with goal < 70 for primary prevention given history of T2DM.

## 2024-04-19 NOTE — Assessment & Plan Note (Signed)
 Normotensive. Continue Olmesartan  40 mg/daily.

## 2024-04-19 NOTE — Assessment & Plan Note (Signed)
 Has < 1 BM/day. He is prescribed Bentyl  which was originally prescribed for bloating and dyspepsia. I have discontinued this to see if his constipation improves. I advised him to resume Bentyl  if aforementioned symptoms return and to use Miralax  if constipation persists after stopping Bentyl .

## 2024-04-19 NOTE — Assessment & Plan Note (Addendum)
 Incidental finding and non-obstructing. Seen by Vascular in 2024 and discussed with patient the need to follow up with vascular surgery regarding his dissection flap. They wanted to follow up in 09/2023. I provided contact information for home health aide to call.

## 2024-04-19 NOTE — Assessment & Plan Note (Signed)
 BMP today

## 2024-04-19 NOTE — Assessment & Plan Note (Signed)
Prevnar today.

## 2024-04-19 NOTE — Patient Instructions (Addendum)
 Please call and make a follow-up appointment with [facility].  Office: [phone]  Please STOP taking Dicyclomine  (Bentyl ) as this can cause constipation. If your constipation persists after stopping this medication, you can try over-the-counter products like Miralax .

## 2024-04-19 NOTE — Progress Notes (Signed)
 CC: Follow-up  HPI:  [name] is a 73 y.o. male living with a history stated below and presents today for follow-up. Please see problem based assessment and plan for additional details.  Past Medical History:  Diagnosis Date   Asthma    Reported by patient's last physician before coming to [facility].  No PFT's on file.    Blind    Diabetes mellitus, type 2 (HCC)    Diverticulosis    Diverticulosis 03/01/2019   Gout    Hypertension    Impaired mobility and ADLs 01/04/2017   Prostate cancer [facility])    Prostatic adenocarcinoma, Gleason score 7 (3+4), s/p radical prostatectomy 12/04/10 by Dr. Renda.     Current Outpatient Medications on File Prior to Visit  Medication Sig Dispense Refill   allopurinol  (ZYLOPRIM ) 100 MG tablet TAKE 1 TAB BY MOUTH THREE TIMES DAILY 90 tablet 11   atorvastatin  (LIPITOR) 40 MG tablet Take 1 tablet (40 mg total) by mouth daily. 90 tablet 3   fluticasone  (FLONASE ) 50 MCG/ACT nasal spray Place 1 spray into both nostrils daily as needed for allergies or rhinitis. 48 mL 1   metFORMIN  (GLUCOPHAGE ) 1000 MG tablet Take 1 tablet (1,000 mg total) by mouth 2 (two) times daily with a meal. 180 tablet 3   olmesartan  (BENICAR ) 40 MG tablet Take 1 tablet (40 mg total) by mouth daily. 90 tablet 3   pantoprazole  (PROTONIX ) 40 MG tablet TAKE 1 TABLET BY MOUTH EVERY DAY 90 tablet 1   No current facility-administered medications on file prior to visit.    Family History  Problem Relation Age of Onset   Heart attack Father 59   Heart disease Mother    Diabetes Sister     Social History   Socioeconomic History   Marital status: Single    Spouse name: Not on file   Number of children: Not on file   Years of education: Not on file   Highest education level: Not on file  Occupational History   Not on file  Tobacco Use   Smoking status: Former    Types: Cigarettes   Smokeless tobacco: Never   Tobacco comments:    30 years ago  Substance and Sexual Activity    Alcohol use: No    Alcohol/week: 0.0 standard drinks of alcohol   Drug use: No   Sexual activity: Not on file  Other Topics Concern   Not on file  Social History Narrative   The patient lives alone in [place].  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  The patient is on medicaid/medicare, and receives a disability check.  The patient has a sister in [place].  The patient has a 10th grade education.  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  The patient lost his vision at age 30, and now can only barely see light, but no colors or shapes.  The patient has an aide who comes to his house every week to help him sort his medications.   Social Drivers of Health   Financial Resource Strain: Medium Risk (03/15/2024)   Overall Financial Resource Strain (CARDIA)    Difficulty of Paying Living Expenses: Somewhat hard  Food Insecurity: No Food Insecurity (03/15/2024)   Hunger Vital Sign    Worried About Running Out of Food in the Last Year: Never true    Ran Out of Food in  the Last Year: Never true  Transportation Needs: No Transportation Needs (03/15/2024)   PRAPARE - Administrator, Civil Service (Medical): No    Lack of Transportation (Non-Medical): No  Physical Activity: Inactive (03/15/2024)   Exercise Vital Sign    Days of Exercise per Week: 0 days    Minutes of Exercise per Session: 0 min  Stress: No Stress Concern Present (03/15/2024)   [facility] [employer] - Occupational Stress Questionnaire    Feeling of Stress: Not at all  Social Connections: Socially Isolated (03/15/2024)   Social Connection and Isolation Panel    Frequency of Communication with Friends and Family: Never    Frequency of Social Gatherings with Friends and Family: Never    Attends Religious Services: Never    Database administrator or Organizations: No    Attends Banker Meetings: Never     Marital Status: Never married  Intimate Partner Violence: Not At Risk (03/15/2024)   Humiliation, Afraid, Rape, and Kick questionnaire    Fear of Current or Ex-Partner: No    Emotionally Abused: No    Physically Abused: No    Sexually Abused: No    Review of Systems: ROS negative except for what is noted on the assessment and plan.  Vitals:   04/19/24 0839  BP: 123/86  Pulse: 92  Temp: 98.2 F (36.8 C)  TempSrc: Oral  SpO2: 94%  Weight: 180 lb 9.6 oz (81.9 kg)  Height: 5' 6 (1.676 m)    Physical Exam: Constitutional: well-appearing, sitting in chair, in no acute distress Cardiovascular: regular rate and rhythm Pulmonary/Chest: normal work of breathing on room air, lungs clear to auscultation bilaterally Skin: warm and dry Psych: normal mood and behavior  Assessment & Plan:     Patient discussed with Dr. 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 Seen by Vascular in 2024 and discussed with patient the need to follow up with vascular surgery regarding his dissection flap. They wanted to follow up in 09/2023. I provided contact information for home health aide to call.  Diabetes mellitus, type 2 (HCC) Well-controlled with A1c of 6.6. Managed with Metformin  1,000 BID. Check BMP.  Constipation Has < 1 BM/day. He is prescribed Bentyl  which was originally prescribed for bloating and dyspepsia. I have discontinued this to see if his constipation improves. I advised him to resume Bentyl  if aforementioned symptoms return and to use Miralax  if constipation persists after stopping Bentyl .   CKD (chronic kidney disease) stage 3, GFR 30-59 ml/min (HCC) BMP today.  Hypertension Normotensive. Continue Olmesartan  40 mg/daily.   Hyperlipidemia associated with type 2 diabetes mellitus (HCC) Check Lipid panel today. Managed with Lipitor 40 mg daily with goal < 70 for primary prevention given history of T2DM.  Health care  maintenance Prevnar today.   Norman Lobstein, D.O. [facility] Internal Medicine, PGY-2 Phone: [phone] Date 04/19/2024 Time 11:23 AM

## 2024-04-20 ENCOUNTER — Ambulatory Visit: Payer: Self-pay | Admitting: Student

## 2024-04-20 LAB — LIPID PANEL
Chol/HDL Ratio: 2.5 ratio (ref 0.0–5.0)
Cholesterol, Total: 161 mg/dL (ref 100–199)
HDL: 65 mg/dL (ref >39)
LDL Chol Calc (NIH): 88 mg/dL (ref 0–99)
Triglycerides: 37 mg/dL (ref 0–149)
VLDL Cholesterol Cal: 8 mg/dL (ref 5–40)

## 2024-04-20 LAB — BASIC METABOLIC PANEL WITH GFR
BUN/Creatinine Ratio: 13 (ref 10–24)
BUN: 17 mg/dL (ref 8–27)
CO2: 20 mmol/L (ref 20–29)
Calcium: 9.8 mg/dL (ref 8.6–10.2)
Chloride: 103 mmol/L (ref 96–106)
Creatinine, Ser: 1.28 mg/dL — ABNORMAL HIGH (ref 0.76–1.27)
Glucose: 81 mg/dL (ref 70–99)
Potassium: 4.7 mmol/L (ref 3.5–5.2)
Sodium: 140 mmol/L (ref 134–144)
eGFR: 60 mL/min/1.73 (ref >59)

## 2024-04-21 NOTE — Progress Notes (Signed)
 Internal Medicine Clinic Attending  Case discussed with the resident at the time of the visit.  We reviewed the resident's history and exam and pertinent patient test results.  I agree with the assessment, diagnosis, and plan of care documented in the resident's note.

## 2024-06-17 ENCOUNTER — Other Ambulatory Visit: Payer: Self-pay | Admitting: Internal Medicine

## 2024-06-17 DIAGNOSIS — I10 Essential (primary) hypertension: Secondary | ICD-10-CM

## 2024-06-19 NOTE — Telephone Encounter (Signed)
 Dicyclomine  discontinued 09/19/23. Olmesartan  sent to the pharmacy.

## 2024-07-02 ENCOUNTER — Other Ambulatory Visit: Payer: Self-pay | Admitting: Internal Medicine

## 2024-07-02 DIAGNOSIS — E119 Type 2 diabetes mellitus without complications: Secondary | ICD-10-CM

## 2024-07-03 NOTE — Telephone Encounter (Signed)
 Medication sent to pharmacy

## 2024-07-17 ENCOUNTER — Other Ambulatory Visit: Payer: Self-pay | Admitting: Internal Medicine

## 2024-07-17 ENCOUNTER — Other Ambulatory Visit: Payer: Self-pay | Admitting: Student

## 2024-07-17 DIAGNOSIS — E119 Type 2 diabetes mellitus without complications: Secondary | ICD-10-CM

## 2024-07-17 NOTE — Telephone Encounter (Signed)
 Medication discontinued 01/10/24

## 2024-07-17 NOTE — Telephone Encounter (Signed)
 Medication sent to pharmacy

## 2024-09-13 NOTE — Progress Notes (Signed)
 Austin Anderson                                          MRN: [id number]   09/13/2024   The [facility] Quality Team Specialist reviewed this patient medical record for the purposes of chart review for care gap closure. The following were reviewed: chart review for care gap closure-kidney health evaluation for diabetes:eGFR  and uACR.    [facility] Quality Team

## 2024-10-14 ENCOUNTER — Other Ambulatory Visit: Payer: Self-pay | Admitting: Student

## 2024-10-14 DIAGNOSIS — M109 Gout, unspecified: Secondary | ICD-10-CM

## 2024-10-14 DIAGNOSIS — J45909 Unspecified asthma, uncomplicated: Secondary | ICD-10-CM

## 2024-10-16 ENCOUNTER — Ambulatory Visit: Payer: Self-pay

## 2024-10-16 NOTE — Telephone Encounter (Signed)
 FYI Only or Action Required?: FYI only for provider: appointment scheduled on 10/27/24.  Patient was last seen in primary care on 04/19/2024 by Marylu Gee, DO.  Called Nurse Triage reporting Appointment.   Triage Disposition: See PCP Within 2 Weeks  Patient/caregiver understands and will follow disposition?: Yes                    Message from [place] F sent at 10/16/2024  1:23 PM EST  Reason for Triage:  Concern: pain in hands and feet; asthma concern ( shortness of breath)  Symptoms:  When did the symptoms start?: when the weather cooled down ( a few days ago)   What have you done to aid in the concern ? Have you taken anything to assist with the matter?: Yes   If so, what did you take?: albuterol  (VENTOLIN  HFA) 108 (90 Base) MCG/ACT inhaler (patient states he gets relief from taking the inhaler sometimes)    Wanted to let you know I will be transferring you to further discuss your concern. Please be advised the nurse can assist with scheduling.   Reason for Disposition  Requesting regular office appointment  Answer Assessment - Initial Assessment Questions 1. REASON FOR CALL: What is the main reason for your call? or How can I best help you?     Patient wants to reschedule his appointment (6 month follow up) from this Friday to next Friday.  2. SYMPTOMS : Do you have any symptoms?      Patient mentioned to [facility] hand and foot pain as well as asthma with SOB. RN asked if any new or worsening symptoms and he declines. He states hand and foot pain x 30 years. His SOB comes and goes and That's been all my life; Fairly stable when I use my medicine. Using Albuterol  inhaler 2 puffs bid to tid. No SOB at this time during triage. No labored breathing or wheezing noted.  3. OTHER QUESTIONS: Do you have any other questions?     No.  Protocols used: Information Only Call - No Triage-A-AH

## 2024-10-17 NOTE — Telephone Encounter (Signed)
 Medication sent to pharmacy

## 2024-10-20 ENCOUNTER — Ambulatory Visit: Admitting: Student

## 2024-10-27 ENCOUNTER — Ambulatory Visit: Admitting: Student

## 2025-03-21 ENCOUNTER — Ambulatory Visit
